# Patient Record
Sex: Male | Born: 1960 | Race: Black or African American | Hispanic: No | Marital: Married | Smoking: Former smoker
Health system: Southern US, Community
[De-identification: ages and names within clinical notes are randomized; demographics above are authoritative.]

## PROBLEM LIST (undated history)

## (undated) DIAGNOSIS — S14105A Unspecified injury at C5 level of cervical spinal cord, initial encounter: Secondary | ICD-10-CM

## (undated) DIAGNOSIS — E785 Hyperlipidemia, unspecified: Secondary | ICD-10-CM

## (undated) DIAGNOSIS — G8929 Other chronic pain: Secondary | ICD-10-CM

## (undated) DIAGNOSIS — J45909 Unspecified asthma, uncomplicated: Secondary | ICD-10-CM

## (undated) DIAGNOSIS — U071 COVID-19: Secondary | ICD-10-CM

## (undated) DIAGNOSIS — I10 Essential (primary) hypertension: Secondary | ICD-10-CM

## (undated) DIAGNOSIS — C801 Malignant (primary) neoplasm, unspecified: Secondary | ICD-10-CM

## (undated) DIAGNOSIS — H409 Unspecified glaucoma: Secondary | ICD-10-CM

## (undated) HISTORY — DX: Unspecified asthma, uncomplicated: J45.909

## (undated) HISTORY — DX: Other chronic pain: G89.29

## (undated) HISTORY — DX: COVID-19: U07.1

## (undated) HISTORY — DX: Unspecified injury at C5 level of cervical spinal cord, initial encounter: S14.105A

## (undated) HISTORY — DX: Malignant (primary) neoplasm, unspecified: C80.1

## (undated) HISTORY — PX: PROSTATECTOMY: SHX69

## (undated) HISTORY — PX: BACK SURGERY: SHX140

## (undated) HISTORY — DX: Essential (primary) hypertension: I10

## (undated) HISTORY — DX: Hyperlipidemia, unspecified: E78.5

## (undated) HISTORY — DX: Unspecified glaucoma: H40.9

## (undated) HISTORY — PX: LUMBAR LAMINECTOMY: SHX95

---

## 2013-08-02 LAB — HM COLONOSCOPY

## 2018-11-06 DIAGNOSIS — J300 Vasomotor rhinitis: Secondary | ICD-10-CM | POA: Diagnosis not present

## 2018-11-06 DIAGNOSIS — H2511 Age-related nuclear cataract, right eye: Secondary | ICD-10-CM | POA: Diagnosis not present

## 2018-11-06 DIAGNOSIS — E7849 Other hyperlipidemia: Secondary | ICD-10-CM | POA: Diagnosis not present

## 2018-11-06 DIAGNOSIS — C801 Malignant (primary) neoplasm, unspecified: Secondary | ICD-10-CM | POA: Diagnosis not present

## 2018-11-06 DIAGNOSIS — H25012 Cortical age-related cataract, left eye: Secondary | ICD-10-CM | POA: Diagnosis not present

## 2018-11-06 DIAGNOSIS — H2512 Age-related nuclear cataract, left eye: Secondary | ICD-10-CM | POA: Diagnosis not present

## 2018-11-06 DIAGNOSIS — R7302 Impaired glucose tolerance (oral): Secondary | ICD-10-CM | POA: Diagnosis not present

## 2018-11-06 DIAGNOSIS — D509 Iron deficiency anemia, unspecified: Secondary | ICD-10-CM | POA: Diagnosis not present

## 2018-11-06 DIAGNOSIS — H25011 Cortical age-related cataract, right eye: Secondary | ICD-10-CM | POA: Diagnosis not present

## 2018-11-06 DIAGNOSIS — H3581 Retinal edema: Secondary | ICD-10-CM | POA: Diagnosis not present

## 2018-11-06 DIAGNOSIS — E538 Deficiency of other specified B group vitamins: Secondary | ICD-10-CM | POA: Diagnosis not present

## 2018-11-06 DIAGNOSIS — B0052 Herpesviral keratitis: Secondary | ICD-10-CM | POA: Diagnosis not present

## 2018-11-06 DIAGNOSIS — H401132 Primary open-angle glaucoma, bilateral, moderate stage: Secondary | ICD-10-CM | POA: Diagnosis not present

## 2018-11-06 DIAGNOSIS — M25569 Pain in unspecified knee: Secondary | ICD-10-CM | POA: Diagnosis not present

## 2018-11-06 DIAGNOSIS — B0053 Herpesviral conjunctivitis: Secondary | ICD-10-CM | POA: Diagnosis not present

## 2018-11-06 DIAGNOSIS — E559 Vitamin D deficiency, unspecified: Secondary | ICD-10-CM | POA: Diagnosis not present

## 2018-11-06 DIAGNOSIS — H35039 Hypertensive retinopathy, unspecified eye: Secondary | ICD-10-CM | POA: Diagnosis not present

## 2018-11-06 DIAGNOSIS — R972 Elevated prostate specific antigen [PSA]: Secondary | ICD-10-CM | POA: Diagnosis not present

## 2018-11-06 DIAGNOSIS — H35719 Central serous chorioretinopathy, unspecified eye: Secondary | ICD-10-CM | POA: Diagnosis not present

## 2018-11-06 DIAGNOSIS — I10 Essential (primary) hypertension: Secondary | ICD-10-CM | POA: Diagnosis not present

## 2018-12-27 DIAGNOSIS — J3089 Other allergic rhinitis: Secondary | ICD-10-CM | POA: Diagnosis not present

## 2019-01-01 DIAGNOSIS — E559 Vitamin D deficiency, unspecified: Secondary | ICD-10-CM | POA: Diagnosis not present

## 2019-01-01 DIAGNOSIS — H409 Unspecified glaucoma: Secondary | ICD-10-CM | POA: Diagnosis not present

## 2019-01-01 DIAGNOSIS — J309 Allergic rhinitis, unspecified: Secondary | ICD-10-CM | POA: Diagnosis not present

## 2019-01-01 DIAGNOSIS — M25541 Pain in joints of right hand: Secondary | ICD-10-CM | POA: Diagnosis not present

## 2019-01-01 DIAGNOSIS — E6609 Other obesity due to excess calories: Secondary | ICD-10-CM | POA: Diagnosis not present

## 2019-01-01 DIAGNOSIS — G629 Polyneuropathy, unspecified: Secondary | ICD-10-CM | POA: Diagnosis not present

## 2019-01-01 DIAGNOSIS — I10 Essential (primary) hypertension: Secondary | ICD-10-CM | POA: Diagnosis not present

## 2019-01-01 DIAGNOSIS — E538 Deficiency of other specified B group vitamins: Secondary | ICD-10-CM | POA: Diagnosis not present

## 2019-01-01 DIAGNOSIS — H04123 Dry eye syndrome of bilateral lacrimal glands: Secondary | ICD-10-CM | POA: Diagnosis not present

## 2019-01-01 DIAGNOSIS — E785 Hyperlipidemia, unspecified: Secondary | ICD-10-CM | POA: Diagnosis not present

## 2019-01-09 ENCOUNTER — Ambulatory Visit: Payer: Self-pay | Admitting: Family Medicine

## 2019-01-15 ENCOUNTER — Telehealth: Payer: Self-pay

## 2019-01-15 NOTE — Telephone Encounter (Signed)
Copied from [place] [id number]. Topic: Appointment Scheduling - Scheduling Inquiry for Clinic >> Jan 15, 2019  2:56 PM Scherrie Gerlach wrote: Reason for CRM: pt returning rasheeda call to resc his appt

## 2019-01-23 ENCOUNTER — Ambulatory Visit: Payer: Self-pay | Admitting: Internal Medicine

## 2019-02-04 ENCOUNTER — Telehealth: Payer: Self-pay | Admitting: Internal Medicine

## 2019-02-04 NOTE — Telephone Encounter (Signed)
I am thinking this NP appt needs to be reschedule to virtual with NP or later visit with me after COVID 19 face to face visit in sumemr 2020 04/2019   Please call pt to reschedule  thanks  [place]

## 2019-02-05 NOTE — Telephone Encounter (Signed)
I left the patient a message to call the office to reschedule.

## 2019-02-05 NOTE — Telephone Encounter (Signed)
The patient has been reschedule to June.

## 2019-02-06 NOTE — Telephone Encounter (Signed)
Ok. Thank you.

## 2019-02-13 DIAGNOSIS — Z86010 Personal history of colonic polyps: Secondary | ICD-10-CM | POA: Diagnosis not present

## 2019-02-13 DIAGNOSIS — I10 Essential (primary) hypertension: Secondary | ICD-10-CM | POA: Diagnosis not present

## 2019-02-13 DIAGNOSIS — K7689 Other specified diseases of liver: Secondary | ICD-10-CM | POA: Diagnosis not present

## 2019-02-13 DIAGNOSIS — K5909 Other constipation: Secondary | ICD-10-CM | POA: Diagnosis not present

## 2019-02-13 DIAGNOSIS — E669 Obesity, unspecified: Secondary | ICD-10-CM | POA: Diagnosis not present

## 2019-02-13 DIAGNOSIS — K862 Cyst of pancreas: Secondary | ICD-10-CM | POA: Diagnosis not present

## 2019-02-13 DIAGNOSIS — Z800 Family history of malignant neoplasm of digestive organs: Secondary | ICD-10-CM | POA: Diagnosis not present

## 2019-02-25 ENCOUNTER — Ambulatory Visit: Payer: Self-pay | Admitting: Internal Medicine

## 2019-03-28 ENCOUNTER — Ambulatory Visit (INDEPENDENT_AMBULATORY_CARE_PROVIDER_SITE_OTHER): Payer: Medicare HMO | Admitting: Internal Medicine

## 2019-03-28 ENCOUNTER — Other Ambulatory Visit: Payer: Self-pay

## 2019-03-28 DIAGNOSIS — E538 Deficiency of other specified B group vitamins: Secondary | ICD-10-CM

## 2019-03-28 DIAGNOSIS — R739 Hyperglycemia, unspecified: Secondary | ICD-10-CM

## 2019-03-28 DIAGNOSIS — Z1159 Encounter for screening for other viral diseases: Secondary | ICD-10-CM

## 2019-03-28 DIAGNOSIS — G8929 Other chronic pain: Secondary | ICD-10-CM | POA: Diagnosis not present

## 2019-03-28 DIAGNOSIS — S14105S Unspecified injury at C5 level of cervical spinal cord, sequela: Secondary | ICD-10-CM | POA: Diagnosis not present

## 2019-03-28 DIAGNOSIS — H409 Unspecified glaucoma: Secondary | ICD-10-CM | POA: Diagnosis not present

## 2019-03-28 DIAGNOSIS — E559 Vitamin D deficiency, unspecified: Secondary | ICD-10-CM

## 2019-03-28 DIAGNOSIS — E785 Hyperlipidemia, unspecified: Secondary | ICD-10-CM | POA: Diagnosis not present

## 2019-03-28 DIAGNOSIS — I10 Essential (primary) hypertension: Secondary | ICD-10-CM

## 2019-03-28 DIAGNOSIS — C801 Malignant (primary) neoplasm, unspecified: Secondary | ICD-10-CM | POA: Diagnosis not present

## 2019-03-28 DIAGNOSIS — Z8546 Personal history of malignant neoplasm of prostate: Secondary | ICD-10-CM | POA: Diagnosis not present

## 2019-03-28 DIAGNOSIS — J4520 Mild intermittent asthma, uncomplicated: Secondary | ICD-10-CM

## 2019-03-28 DIAGNOSIS — Z13818 Encounter for screening for other digestive system disorders: Secondary | ICD-10-CM

## 2019-03-28 DIAGNOSIS — Z1329 Encounter for screening for other suspected endocrine disorder: Secondary | ICD-10-CM | POA: Diagnosis not present

## 2019-03-28 MED ORDER — ALBUTEROL SULFATE HFA 108 (90 BASE) MCG/ACT IN AERS: 1.0000 | INHALATION_SPRAY | Freq: Four times a day (QID) | RESPIRATORY_TRACT | Status: DC | PRN

## 2019-03-28 MED ORDER — LOSARTAN POTASSIUM 50 MG PO TABS: 50.0000 mg | ORAL_TABLET | Freq: Every day | ORAL | 3 refills | Status: DC

## 2019-03-28 MED ORDER — B-12 500 MCG PO TABS: 500.0000 ug | ORAL_TABLET | Freq: Every day | ORAL | 3 refills | Status: DC

## 2019-03-28 MED ORDER — ATORVASTATIN CALCIUM 20 MG PO TABS: 20.0000 mg | ORAL_TABLET | Freq: Every day | ORAL | 3 refills | Status: DC

## 2019-03-28 MED ORDER — GABAPENTIN 800 MG PO TABS: 800.0000 mg | ORAL_TABLET | Freq: Three times a day (TID) | ORAL | Status: DC

## 2019-03-28 NOTE — Progress Notes (Signed)
Virtual Visit via Video Note  I connected with Matthew Turner  on 03/28/19 at  3:50 PM EDT by a video enabled telemedicine application and verified that I am speaking with the correct person using two identifiers. Location patient: home Location provider:work or home office Persons participating in the virtual visit: patient, provider, pts wife   I discussed the limitations of evaluation and management by telemedicine and the availability of in person appointments. The patient expressed understanding and agreed to proceed.   HPI: Essential hypertension/HLD -  losartan (COZAAR) 50 MG tablet he is not checking his BP at home  Needs refill of losartan. On lipitor 20 mg qd  chronic pain s/p SCI in 2008 he reports he was unable to walk initially paralyzed from the waist down but recovered with PT but has chronic pain.  He f/u pain clinic in [place] and does not want to change gabapentin (NEURONTIN) 800 MG tablet tid chronic narcotics he f/u with pain clinic in [place] monthly   Mild intermittent extrinsic asthma without complication triggers include dust and he needs refill of inhalern: albuterol (PROAIR HFA) 108 (90 Base) MCG/ACT inhaler  Glaucoma, unspecified glaucoma type, unspecified laterality-given list of eye MD to choose in [place]  History of prostate cancer s/p prostatectomy and radiation    Hyperglycemia - Plan: Hemoglobin A1c  Vitamin D deficiency - on high dose D3 50K weekly     ROS: See pertinent positives and negatives per HPI. General: weight stable  HEENT: no sore throat  CV: no chest pain  Lungs: no sob  GI: no ab pain  Neuro: no h/a  MSK: chronic pain s/p SCI Psych: no anxiety/depression   Past Medical History:  Diagnosis Date  . Asthma    dust  . Cancer ([place])    prostate; early 54s   . Chronic pain    f/u pain clinic in [place]; arms, neck, back-pain clinic in [place]  . Glaucoma   . HLD (hyperlipidemia)   . Hypertension   . Spinal cord injury, C5-C7 ([place])    C6.C7  in 2008 injury at work he was paralyzed from waist down but walking again;workers comp    Past Surgical History:  Procedure Laterality Date  . LUMBAR LAMINECTOMY    . PROSTATECTOMY     had radiation no injections h/o prostate pump     Family History  Problem Relation Age of Onset  . Heart failure Mother   . Cancer Father        prostate  . Cancer Brother        prostate  . Pancreatic cancer Paternal Grandmother   . Prostate cancer Other   . Prostate cancer Brother        prostate    SOCIAL HX: married, retired    Current Outpatient Medications:  .  albuterol (PROAIR HFA) 108 (90 Base) MCG/ACT inhaler, Inhale 1-2 puffs into the lungs every 6 (six) hours as needed for wheezing or shortness of breath., Disp: , Rfl:  .  atorvastatin (LIPITOR) 20 MG tablet, Take 1 tablet (20 mg total) by mouth daily., Disp: 90 tablet, Rfl: 3 .  bimatoprost (LUMIGAN) 0.01 % SOLN, Apply to eye. 1 drop affected eye, Disp: , Rfl:  .  FOLIC ACID PO, Take 1 mg by mouth daily. , Disp: , Rfl:  .  gabapentin (NEURONTIN) 800 MG tablet, Take 1 tablet (800 mg total) by mouth 3 (three) times daily., Disp: , Rfl:  .  oxyCODONE-acetaminophen (PERCOCET) 10-325 MG tablet, Take  1 tablet by mouth every 8 (eight) hours as needed for pain. Dr. Vira [facility] [place], Disp: , Rfl:  .  Vitamin D, Ergocalciferol, (DRISDOL) 1.25 MG (50000 UT) CAPS capsule, , Disp: , Rfl:  .  Cyanocobalamin (B-12) 500 MCG TABS, Take 500 mcg by mouth daily., Disp: 90 tablet, Rfl: 3 .  DYMISTA 137-50 MCG/ACT SUSP, , Disp: , Rfl:  .  losartan (COZAAR) 50 MG tablet, Take 1 tablet (50 mg total) by mouth daily., Disp: 90 tablet, Rfl: 3 .  pantoprazole (PROTONIX) 40 MG tablet, pantoprazole 40 mg tablet,delayed release, Disp: , Rfl:   EXAM:  VITALS per patient if applicable:  GENERAL: alert, oriented, appears well and in no acute distress  HEENT: atraumatic, conjunttiva clear, no obvious abnormalities on inspection of  external nose and ears  NECK: normal movements of the head and neck  LUNGS: on inspection no signs of respiratory distress, breathing rate appears normal, no obvious gross SOB, gasping or wheezing  CV: no obvious cyanosis  MS: moves all visible extremities without noticeable abnormality  PSYCH/NEURO: pleasant and cooperative, no obvious depression or anxiety, speech and thought processing grossly intact  ASSESSMENT AND PLAN:  Discussed the following assessment and plan:  Essential hypertension - Plan: losartan (COZAAR) 50 MG tablet, sch fasting labs  Also do RN visit BP check with labs  B12 deficiency - Plan: Cyanocobalamin (B-12) 500 MCG TABS, B12 500 mcg qd  Other chronic pain s/p SCI in 2008 - Plan: gabapentin (NEURONTIN) 800 MG tablet, chronic narcotics he f/u with pain clinic in [place] monthly  -cont meds   Mild intermittent extrinsic asthma without complication - Plan: albuterol (PROAIR HFA) 108 (90 Base) MCG/ACT inhaler  Hyperlipidemia, unspecified hyperlipidemia type - Plan: atorvastatin (LIPITOR) 20 MG tablet  Glaucoma, unspecified glaucoma type, unspecified laterality-given list of eye MD to choose in [place]  History of prostate cancer - Plan: Urinalysis, Routine w reflex microscopic, PSA, Medicare ( [place] only)  Hyperglycemia - Plan: Hemoglobin A1c  Vitamin D deficiency - Plan: Vitamin D (25 hydroxy) cont high dose D3 50K weekly for now   HM  Get vaccine records [place] -[facility] in [place] Colonoscopy due and rec for future pt will do in [place] comfortable with this MD  sch fasting labs  Check PSA h/o prostate cancer with h/o removal and radiation no injections Former smoker quit in 40s  Occasional etoh   Given names of eye MD and dentists in the area  Former PCP Dr. Genia Plants [place] in [place] [address] in [place]     I discussed the assessment and treatment plan with the patient. The patient was  provided an opportunity to ask questions and all were answered. The patient agreed with the plan and demonstrated an understanding of the instructions.   The patient was advised to call back or seek an in-person evaluation if the symptoms worsen or if the condition fails to improve as anticipated.  Time spent 25 minutes  Delorise Luckey, MD

## 2019-04-01 ENCOUNTER — Encounter: Payer: Self-pay | Admitting: Internal Medicine

## 2019-04-01 DIAGNOSIS — I10 Essential (primary) hypertension: Secondary | ICD-10-CM | POA: Insufficient documentation

## 2019-04-01 DIAGNOSIS — C801 Malignant (primary) neoplasm, unspecified: Secondary | ICD-10-CM | POA: Insufficient documentation

## 2019-04-01 DIAGNOSIS — S14105A Unspecified injury at C5 level of cervical spinal cord, initial encounter: Secondary | ICD-10-CM | POA: Insufficient documentation

## 2019-04-01 DIAGNOSIS — G8929 Other chronic pain: Secondary | ICD-10-CM | POA: Insufficient documentation

## 2019-04-01 DIAGNOSIS — J45909 Unspecified asthma, uncomplicated: Secondary | ICD-10-CM | POA: Insufficient documentation

## 2019-04-01 DIAGNOSIS — E785 Hyperlipidemia, unspecified: Secondary | ICD-10-CM | POA: Insufficient documentation

## 2019-04-01 DIAGNOSIS — Z8546 Personal history of malignant neoplasm of prostate: Secondary | ICD-10-CM | POA: Insufficient documentation

## 2019-04-01 DIAGNOSIS — H409 Unspecified glaucoma: Secondary | ICD-10-CM | POA: Insufficient documentation

## 2019-04-01 NOTE — Patient Instructions (Addendum)
Eye doctors 1. [facility] in [place]  2. Dr. Gertie Baron [place]  3. [facility] [place]   Dentists  1. [facility] [place] 2. [place] [facility]  3. Dr. Johnnette Litter [place]  3. Dr. Lyanne Co [facility] in [place]    Hypertension Hypertension, commonly called high blood pressure, is when the force of blood pumping through the arteries is too strong. The arteries are the blood vessels that carry blood from the heart throughout the body. Hypertension forces the heart to work harder to pump blood and may cause arteries to become narrow or stiff. Having untreated or uncontrolled hypertension can cause heart attacks, strokes, kidney disease, and other problems. A blood pressure reading consists of a higher number over a lower number. Ideally, your blood pressure should be below 120/80. The first ("top") number is called the systolic pressure. It is a measure of the pressure in your arteries as your heart beats. The second ("bottom") number is called the diastolic pressure. It is a measure of the pressure in your arteries as the heart relaxes. What are the causes? The cause of this condition is not known. What increases the risk? Some risk factors for high blood pressure are under your control. Others are not. Factors you can change  Smoking.  Having type 2 diabetes mellitus, high cholesterol, or both.  Not getting enough exercise or physical activity.  Being overweight.  Having too much fat, sugar, calories, or salt (sodium) in your diet.  Drinking too much alcohol. Factors that are difficult or impossible to change  Having chronic kidney disease.  Having a family history of high blood pressure.  Age. Risk increases with age.  Race. You may be at higher risk if you are African-American.  Gender. Men are at higher risk than women before age 50. After age 49, women are at higher risk than men.  Having obstructive  sleep apnea.  Stress. What are the signs or symptoms? Extremely high blood pressure (hypertensive crisis) may cause:  Headache.  Anxiety.  Shortness of breath.  Nosebleed.  Nausea and vomiting.  Severe chest pain.  Jerky movements you cannot control (seizures). How is this diagnosed? This condition is diagnosed by measuring your blood pressure while you are seated, with your arm resting on a surface. The cuff of the blood pressure monitor will be placed directly against the skin of your upper arm at the level of your heart. It should be measured at least twice using the same arm. Certain conditions can cause a difference in blood pressure between your right and left arms. Certain factors can cause blood pressure readings to be lower or higher than normal (elevated) for a short period of time:  When your blood pressure is higher when you are in a health care provider's office than when you are at home, this is called white coat hypertension. Most people with this condition do not need medicines.  When your blood pressure is higher at home than when you are in a health care provider's office, this is called masked hypertension. Most people with this condition may need medicines to control blood pressure. If you have a high blood pressure reading during one visit or you have normal blood pressure with other risk factors:  You may be asked to return on a different day to have your blood pressure checked again.  You may be asked to monitor your blood pressure at home for 1 week or  longer. If you are diagnosed with hypertension, you may have other blood or imaging tests to help your health care provider understand your overall risk for other conditions. How is this treated? This condition is treated by making healthy lifestyle changes, such as eating healthy foods, exercising more, and reducing your alcohol intake. Your health care provider may prescribe medicine if lifestyle changes are  not enough to get your blood pressure under control, and if:  Your systolic blood pressure is above 130.  Your diastolic blood pressure is above 80. Your personal target blood pressure may vary depending on your medical conditions, your age, and other factors. Follow these instructions at home: Eating and drinking   Eat a diet that is high in fiber and potassium, and low in sodium, added sugar, and fat. An example eating plan is called the DASH (Dietary Approaches to Stop Hypertension) diet. To eat this way: ? Eat plenty of fresh fruits and vegetables. Try to fill half of your plate at each meal with fruits and vegetables. ? Eat whole grains, such as whole wheat pasta, brown rice, or whole grain bread. Fill about one quarter of your plate with whole grains. ? Eat or drink low-fat dairy products, such as skim milk or low-fat yogurt. ? Avoid fatty cuts of meat, processed or cured meats, and poultry with skin. Fill about one quarter of your plate with lean proteins, such as fish, chicken without skin, beans, eggs, and tofu. ? Avoid premade and processed foods. These tend to be higher in sodium, added sugar, and fat.  Reduce your daily sodium intake. Most people with hypertension should eat less than 1,500 mg of sodium a day.  Limit alcohol intake to no more than 1 drink a day for nonpregnant women and 2 drinks a day for men. One drink equals 12 oz of beer, 5 oz of wine, or 1 oz of hard liquor. Lifestyle   Work with your health care provider to maintain a healthy body weight or to lose weight. Ask what an ideal weight is for you.  Get at least 30 minutes of exercise that causes your heart to beat faster (aerobic exercise) most days of the week. Activities may include walking, swimming, or biking.  Include exercise to strengthen your muscles (resistance exercise), such as pilates or lifting weights, as part of your weekly exercise routine. Try to do these types of exercises for 30 minutes at  least 3 days a week.  Do not use any products that contain nicotine or tobacco, such as cigarettes and e-cigarettes. If you need help quitting, ask your health care provider.  Monitor your blood pressure at home as told by your health care provider.  Keep all follow-up visits as told by your health care provider. This is important. Medicines  Take over-the-counter and prescription medicines only as told by your health care provider. Follow directions carefully. Blood pressure medicines must be taken as prescribed.  Do not skip doses of blood pressure medicine. Doing this puts you at risk for problems and can make the medicine less effective.  Ask your health care provider about side effects or reactions to medicines that you should watch for. Contact a health care provider if:  You think you are having a reaction to a medicine you are taking.  You have headaches that keep coming back (recurring).  You feel dizzy.  You have swelling in your ankles.  You have trouble with your vision. Get help right away if:  You develop a  severe headache or confusion.  You have unusual weakness or numbness.  You feel faint.  You have severe pain in your chest or abdomen.  You vomit repeatedly.  You have trouble breathing. Summary  Hypertension is when the force of blood pumping through your arteries is too strong. If this condition is not controlled, it may put you at risk for serious complications.  Your personal target blood pressure may vary depending on your medical conditions, your age, and other factors. For most people, a normal blood pressure is less than 120/80.  Hypertension is treated with lifestyle changes, medicines, or a combination of both. Lifestyle changes include weight loss, eating a healthy, low-sodium diet, exercising more, and limiting alcohol. This information is not intended to replace advice given to you by your health care provider. Make sure you discuss any  questions you have with your health care provider. Document Released: 10/09/2005 Document Revised: 09/06/2016 Document Reviewed: 09/06/2016 Elsevier Interactive Patient Education  2019 [place] DASH stands for "Dietary Approaches to Stop Hypertension." The DASH eating plan is a healthy eating plan that has been shown to reduce high blood pressure (hypertension). It may also reduce your risk for type 2 diabetes, heart disease, and stroke. The DASH eating plan may also help with weight loss. What are tips for following this plan?  General guidelines  Avoid eating more than 2,300 mg (milligrams) of salt (sodium) a day. If you have hypertension, you may need to reduce your sodium intake to 1,500 mg a day.  Limit alcohol intake to no more than 1 drink a day for nonpregnant women and 2 drinks a day for men. One drink equals 12 oz of beer, 5 oz of wine, or 1 oz of hard liquor.  Work with your health care provider to maintain a healthy body weight or to lose weight. Ask what an ideal weight is for you.  Get at least 30 minutes of exercise that causes your heart to beat faster (aerobic exercise) most days of the week. Activities may include walking, swimming, or biking.  Work with your health care provider or diet and nutrition specialist (dietitian) to adjust your eating plan to your individual calorie needs. Reading food labels   Check food labels for the amount of sodium per serving. Choose foods with less than 5 percent of the Daily Value of sodium. Generally, foods with less than 300 mg of sodium per serving fit into this eating plan.  To find whole grains, look for the word "whole" as the first word in the ingredient list. Shopping  Buy products labeled as "low-sodium" or "no salt added."  Buy fresh foods. Avoid canned foods and premade or frozen meals. Cooking  Avoid adding salt when cooking. Use salt-free seasonings or herbs instead of table salt or sea salt.  Check with your health care provider or pharmacist before using salt substitutes.  Do not fry foods. Cook foods using healthy methods such as baking, boiling, grilling, and broiling instead.  Cook with heart-healthy oils, such as olive, canola, soybean, or sunflower oil. Meal planning  Eat a balanced diet that includes: ? 5 or more servings of fruits and vegetables each day. At each meal, try to fill half of your plate with fruits and vegetables. ? Up to 6-8 servings of whole grains each day. ? Less than 6 oz of lean meat, poultry, or fish each day. A 3-oz serving of meat is about the same size as a deck of cards.  One egg equals 1 oz. ? 2 servings of low-fat dairy each day. ? A serving of nuts, seeds, or beans 5 times each week. ? Heart-healthy fats. Healthy fats called Omega-3 fatty acids are found in foods such as flaxseeds and coldwater fish, like sardines, salmon, and mackerel.  Limit how much you eat of the following: ? Canned or prepackaged foods. ? Food that is high in trans fat, such as fried foods. ? Food that is high in saturated fat, such as fatty meat. ? Sweets, desserts, sugary drinks, and other foods with added sugar. ? Full-fat dairy products.  Do not salt foods before eating.  Try to eat at least 2 vegetarian meals each week.  Eat more home-cooked food and less restaurant, buffet, and fast food.  When eating at a restaurant, ask that your food be prepared with less salt or no salt, if possible. What foods are recommended? The items listed may not be a complete list. Talk with your dietitian about what dietary choices are best for you. Grains Whole-grain or whole-wheat bread. Whole-grain or whole-wheat pasta. Brown rice. [place]. Bulgur. Whole-grain and low-sodium cereals. Pita bread. Low-fat, low-sodium crackers. Whole-wheat flour tortillas. Vegetables Fresh or frozen vegetables (raw, steamed, roasted, or grilled). Low-sodium or reduced-sodium tomato and  vegetable juice. Low-sodium or reduced-sodium tomato sauce and tomato paste. Low-sodium or reduced-sodium canned vegetables. Fruits All fresh, dried, or frozen fruit. Canned fruit in natural juice (without added sugar). Meat and other protein foods Skinless chicken or [place]. Ground chicken or [place]. Pork with fat trimmed off. Fish and seafood. Egg whites. Dried beans, peas, or lentils. Unsalted nuts, nut butters, and seeds. Unsalted canned beans. Lean cuts of beef with fat trimmed off. Low-sodium, lean deli meat. Dairy Low-fat (1%) or fat-free (skim) milk. Fat-free, low-fat, or reduced-fat cheeses. Nonfat, low-sodium ricotta or cottage cheese. Low-fat or nonfat yogurt. Low-fat, low-sodium cheese. Fats and oils Soft margarine without trans fats. Vegetable oil. Low-fat, reduced-fat, or light mayonnaise and salad dressings (reduced-sodium). Canola, safflower, olive, soybean, and sunflower oils. Avocado. Seasoning and other foods Herbs. Spices. Seasoning mixes without salt. Unsalted popcorn and pretzels. Fat-free sweets. What foods are not recommended? The items listed may not be a complete list. Talk with your dietitian about what dietary choices are best for you. Grains Baked goods made with fat, such as croissants, muffins, or some breads. Dry pasta or rice meal packs. Vegetables Creamed or fried vegetables. Vegetables in a cheese sauce. Regular canned vegetables (not low-sodium or reduced-sodium). Regular canned tomato sauce and paste (not low-sodium or reduced-sodium). Regular tomato and vegetable juice (not low-sodium or reduced-sodium). Angie Fava. Olives. Fruits Canned fruit in a light or heavy syrup. Fried fruit. Fruit in cream or butter sauce. Meat and other protein foods Fatty cuts of meat. Ribs. Fried meat. Berniece Salines. Sausage. Bologna and other processed lunch meats. Salami. Fatback. Hotdogs. Bratwurst. Salted nuts and seeds. Canned beans with added salt. Canned or smoked fish. Whole eggs or  egg yolks. Chicken or [place] with skin. Dairy Whole or 2% milk, cream, and half-and-half. Whole or full-fat cream cheese. Whole-fat or sweetened yogurt. Full-fat cheese. Nondairy creamers. Whipped toppings. Processed cheese and cheese spreads. Fats and oils Butter. Stick margarine. Lard. Shortening. Ghee. Bacon fat. Tropical oils, such as coconut, palm kernel, or palm oil. Seasoning and other foods Salted popcorn and pretzels. Onion salt, garlic salt, seasoned salt, table salt, and sea salt. Worcestershire sauce. Tartar sauce. Barbecue sauce. Teriyaki sauce. Soy sauce, including reduced-sodium. Steak sauce. Canned and packaged gravies.  Fish sauce. Oyster sauce. Cocktail sauce. Horseradish that you find on the shelf. Ketchup. Mustard. Meat flavorings and tenderizers. Bouillon cubes. Hot sauce and Tabasco sauce. Premade or packaged marinades. Premade or packaged taco seasonings. Relishes. Regular salad dressings. Where to find more information:  National Heart, Lung, and [place]: https://wilson-eaton.com/  American Heart Association: www.heart.org Summary  The DASH eating plan is a healthy eating plan that has been shown to reduce high blood pressure (hypertension). It may also reduce your risk for type 2 diabetes, heart disease, and stroke.  With the DASH eating plan, you should limit salt (sodium) intake to 2,300 mg a day. If you have hypertension, you may need to reduce your sodium intake to 1,500 mg a day.  When on the DASH eating plan, aim to eat more fresh fruits and vegetables, whole grains, lean proteins, low-fat dairy, and heart-healthy fats.  Work with your health care provider or diet and nutrition specialist (dietitian) to adjust your eating plan to your individual calorie needs. This information is not intended to replace advice given to you by your health care provider. Make sure you discuss any questions you have with your health care provider. Document Released: 09/28/2011  Document Revised: 10/02/2016 Document Reviewed: 10/02/2016 Elsevier Interactive Patient Education  2019 [employer].

## 2019-06-26 DIAGNOSIS — R69 Illness, unspecified: Secondary | ICD-10-CM | POA: Diagnosis not present

## 2019-07-02 ENCOUNTER — Other Ambulatory Visit (INDEPENDENT_AMBULATORY_CARE_PROVIDER_SITE_OTHER): Payer: Medicare HMO

## 2019-07-02 ENCOUNTER — Other Ambulatory Visit: Payer: Self-pay

## 2019-07-02 DIAGNOSIS — Z8546 Personal history of malignant neoplasm of prostate: Secondary | ICD-10-CM

## 2019-07-02 DIAGNOSIS — E538 Deficiency of other specified B group vitamins: Secondary | ICD-10-CM | POA: Diagnosis not present

## 2019-07-02 DIAGNOSIS — C801 Malignant (primary) neoplasm, unspecified: Secondary | ICD-10-CM

## 2019-07-02 DIAGNOSIS — Z1329 Encounter for screening for other suspected endocrine disorder: Secondary | ICD-10-CM | POA: Diagnosis not present

## 2019-07-02 DIAGNOSIS — R739 Hyperglycemia, unspecified: Secondary | ICD-10-CM

## 2019-07-02 DIAGNOSIS — Z1159 Encounter for screening for other viral diseases: Secondary | ICD-10-CM | POA: Diagnosis not present

## 2019-07-02 DIAGNOSIS — E559 Vitamin D deficiency, unspecified: Secondary | ICD-10-CM

## 2019-07-02 DIAGNOSIS — Z13818 Encounter for screening for other digestive system disorders: Secondary | ICD-10-CM

## 2019-07-02 DIAGNOSIS — I10 Essential (primary) hypertension: Secondary | ICD-10-CM | POA: Diagnosis not present

## 2019-07-02 LAB — PSA, MEDICARE: PSA: 0 ng/ml — ABNORMAL LOW (ref 0.10–4.00)

## 2019-07-02 LAB — COMPREHENSIVE METABOLIC PANEL
ALT: 17 U/L (ref 0–53)
AST: 14 U/L (ref 0–37)
Albumin: 4.2 g/dL (ref 3.5–5.2)
Alkaline Phosphatase: 78 U/L (ref 39–117)
BUN: 12 mg/dL (ref 6–23)
CO2: 29 mEq/L (ref 19–32)
Calcium: 9 mg/dL (ref 8.4–10.5)
Chloride: 106 mEq/L (ref 96–112)
Creatinine, Ser: 1.05 mg/dL (ref 0.40–1.50)
GFR: 72.01 mL/min (ref >60.00)
Glucose, Bld: 97 mg/dL (ref 70–99)
Potassium: 4.4 mEq/L (ref 3.5–5.1)
Sodium: 142 mEq/L (ref 135–145)
Total Bilirubin: 1 mg/dL (ref 0.2–1.2)
Total Protein: 6.6 g/dL (ref 6.0–8.3)

## 2019-07-02 LAB — LIPID PANEL
Cholesterol: 142 mg/dL (ref 0–200)
HDL: 50.8 mg/dL (ref >39.00)
LDL Cholesterol: 82 mg/dL (ref 0–99)
NonHDL: 91.52
Total CHOL/HDL Ratio: 3
Triglycerides: 50 mg/dL (ref 0.0–149.0)
VLDL: 10 mg/dL (ref 0.0–40.0)

## 2019-07-02 LAB — CBC WITH DIFFERENTIAL/PLATELET
Basophils Absolute: 0 10*3/uL (ref 0.0–0.1)
Basophils Relative: 0.8 % (ref 0.0–3.0)
Eosinophils Absolute: 0.1 10*3/uL (ref 0.0–0.7)
Eosinophils Relative: 3.9 % (ref 0.0–5.0)
HCT: 39.8 % (ref 39.0–52.0)
Hemoglobin: 13.5 g/dL (ref 13.0–17.0)
Lymphocytes Relative: 35.1 % (ref 12.0–46.0)
Lymphs Abs: 1.3 10*3/uL (ref 0.7–4.0)
MCHC: 33.9 g/dL (ref 30.0–36.0)
MCV: 90.9 fl (ref 78.0–100.0)
Monocytes Absolute: 0.3 10*3/uL (ref 0.1–1.0)
Monocytes Relative: 7.4 % (ref 3.0–12.0)
Neutro Abs: 2 10*3/uL (ref 1.4–7.7)
Neutrophils Relative %: 52.8 % (ref 43.0–77.0)
Platelets: 218 10*3/uL (ref 150.0–400.0)
RBC: 4.38 Mil/uL (ref 4.22–5.81)
RDW: 13.5 % (ref 11.5–15.5)
WBC: 3.8 10*3/uL — ABNORMAL LOW (ref 4.0–10.5)

## 2019-07-02 LAB — HEMOGLOBIN A1C: Hgb A1c MFr Bld: 5.5 % (ref 4.6–6.5)

## 2019-07-02 LAB — TSH: TSH: 2.65 u[IU]/mL (ref 0.35–4.50)

## 2019-07-02 LAB — VITAMIN B12: Vitamin B-12: 239 pg/mL (ref 211–911)

## 2019-07-02 LAB — VITAMIN D 25 HYDROXY (VIT D DEFICIENCY, FRACTURES): VITD: 33.31 ng/mL (ref 30.00–100.00)

## 2019-07-03 LAB — URINALYSIS, ROUTINE W REFLEX MICROSCOPIC
Bacteria, UA: NONE SEEN /HPF
Bilirubin Urine: NEGATIVE
Glucose, UA: NEGATIVE
Hgb urine dipstick: NEGATIVE
Hyaline Cast: NONE SEEN /LPF
Ketones, ur: NEGATIVE
Leukocytes,Ua: NEGATIVE
Nitrite: NEGATIVE
Specific Gravity, Urine: 1.027 (ref 1.001–1.03)
Squamous Epithelial / HPF: NONE SEEN /HPF (ref <=5)
WBC, UA: NONE SEEN /HPF (ref 0–5)
pH: <=5 (ref 5.0–8.0)

## 2019-07-03 LAB — HEPATITIS C ANTIBODY
Hepatitis C Ab: NONREACTIVE
SIGNAL TO CUT-OFF: 0.01 (ref <1.00)

## 2019-07-03 LAB — HIV ANTIBODY (ROUTINE TESTING W REFLEX): HIV 1&2 Ab, 4th Generation: NONREACTIVE

## 2019-07-09 ENCOUNTER — Encounter: Payer: Self-pay | Admitting: Internal Medicine

## 2019-07-09 ENCOUNTER — Ambulatory Visit (INDEPENDENT_AMBULATORY_CARE_PROVIDER_SITE_OTHER): Payer: Medicare HMO | Admitting: Internal Medicine

## 2019-07-09 VITALS — BP 130/78 | Ht 70.0 in

## 2019-07-09 DIAGNOSIS — I10 Essential (primary) hypertension: Secondary | ICD-10-CM | POA: Diagnosis not present

## 2019-07-09 DIAGNOSIS — S14105S Unspecified injury at C5 level of cervical spinal cord, sequela: Secondary | ICD-10-CM

## 2019-07-09 DIAGNOSIS — D72819 Decreased white blood cell count, unspecified: Secondary | ICD-10-CM

## 2019-07-09 DIAGNOSIS — H409 Unspecified glaucoma: Secondary | ICD-10-CM

## 2019-07-09 DIAGNOSIS — E785 Hyperlipidemia, unspecified: Secondary | ICD-10-CM | POA: Diagnosis not present

## 2019-07-09 DIAGNOSIS — G8929 Other chronic pain: Secondary | ICD-10-CM

## 2019-07-09 DIAGNOSIS — Z8546 Personal history of malignant neoplasm of prostate: Secondary | ICD-10-CM | POA: Diagnosis not present

## 2019-07-09 MED ORDER — LUMIGAN 0.01 % OP SOLN: 1.0000 [drp] | Freq: Every day | OPHTHALMIC | 5 refills | Status: DC

## 2019-07-09 NOTE — Progress Notes (Addendum)
Telephone Note  I connected with Matthew Turner   on 07/09/19 at  9:30 AM EDT telephone and verified that I am speaking with the correct person using two identifiers.  Location patient: car Location provider:work or home office Persons participating in the virtual visit: patient, provider  I discussed the limitations of evaluation and management by telemedicine and the availability of in person appointments. The patient expressed understanding and agreed to proceed.   HPI: 1. HTN 130/78 on losartan 50 mg qd  2. Reviewed labs wbc 3.8 will repeat in future 3. Urine cloudy he is drinking 8-9 bottles water qd  4. Chronic back pain getting injection 07/14/19 in [place] pain clinic pain meds   ROS: See pertinent positives and negatives per HPI.  Past Medical History:  Diagnosis Date  . Asthma    dust  . Cancer ([place])    prostate; early 90s   . Chronic pain    f/u pain clinic in [place]; arms, neck, back-pain clinic in [place]  . Glaucoma   . HLD (hyperlipidemia)   . Hypertension   . Spinal cord injury, C5-C7 ([place])    C6.C7 in 2008 injury at work he was paralyzed from waist down but walking again;workers comp    Past Surgical History:  Procedure Laterality Date  . LUMBAR LAMINECTOMY    . PROSTATECTOMY     had radiation no injections h/o prostate pump     Family History  Problem Relation Age of Onset  . Heart failure Mother   . Cancer Father        prostate  . Cancer Brother        prostate  . Pancreatic cancer Paternal Grandmother   . Prostate cancer Other   . Prostate cancer Brother        prostate    SOCIAL HX:  Former smoker quit 38  Occasional etoh  Married with 2 sons and 1 daughter  From [place] Retired     Current Outpatient Medications:  .  albuterol (PROAIR HFA) 108 (90 Base) MCG/ACT inhaler, Inhale 1-2 puffs into the lungs every 6 (six) hours as needed for wheezing or shortness of breath., Disp: , Rfl:  .  atorvastatin (LIPITOR) 20 MG tablet, Take 1 tablet (20 mg total)  by mouth daily., Disp: 90 tablet, Rfl: 3 .  bimatoprost (LUMIGAN) 0.01 % SOLN, Place 1 drop into both eyes at bedtime. 1 drop affected eye, Disp: 7.5 mL, Rfl: 5 .  Cyanocobalamin (B-12) 500 MCG TABS, Take 500 mcg by mouth daily., Disp: 90 tablet, Rfl: 3 .  DYMISTA 137-50 MCG/ACT SUSP, , Disp: , Rfl:  .  FOLIC ACID PO, Take 1 mg by mouth daily. , Disp: , Rfl:  .  gabapentin (NEURONTIN) 800 MG tablet, Take 1 tablet (800 mg total) by mouth 3 (three) times daily., Disp: , Rfl:  .  losartan (COZAAR) 50 MG tablet, Take 1 tablet (50 mg total) by mouth daily., Disp: 90 tablet, Rfl: 3 .  oxyCODONE-acetaminophen (PERCOCET) 10-325 MG tablet, Take 1 tablet by mouth every 8 (eight) hours as needed for pain. Dr. Vira [facility] [place], Disp: , Rfl:  .  pantoprazole (PROTONIX) 40 MG tablet, pantoprazole 40 mg tablet,delayed release, Disp: , Rfl:  .  Vitamin D, Ergocalciferol, (DRISDOL) 1.25 MG (50000 UT) CAPS capsule, , Disp: , Rfl:   EXAM:  VITALS per patient if applicable:  GENERAL: alert, oriented, appears well and in no acute distress  PSYCH/NEURO: pleasant and cooperative, no  obvious depression or anxiety, speech and thought processing grossly intact  ASSESSMENT AND PLAN:  Discussed the following assessment and plan:  Essential hypertension -cont meds monitor BP  Glaucoma of both eyes, unspecified glaucoma type - Plan: bimatoprost (LUMIGAN) 0.01 % SOLN  Hyperlipidemia, unspecified hyperlipidemia type -cont meds   Other chronic pain C5-C7 level spinal cord injury, sequela ([place]) -f/u pain clinic in [place]   History of prostate cancer undetectable   Leukopenia  -CBC repeat in future  -consider h/o in future if does not resolve  HM  Get vaccine records [place] -[facility] vaccines [facility] in [place] Colonoscopy due and rec for future pt will do in [place] comfortable with this MD   Undetectable PSA 07/02/2019 h/o prostate cancer with h/o removal and radiation  no injections Former smoker quit in 9s  Occasional etoh   Given names of eye MD and dentists in the area  Former PCP Dr. Genia Plants [place] in [place] in [place] received   EGD 09/25/10 esophagitis duodenitis mildly enlarged major papilla path neg h pylori chronic peptic duodentitis c/w adenomatous change; GERD   EGD path 06/05/12 gastric antrum bx negative duodenum negative no H pylori   EGD 01/23/15 mild erythema in stomach   EGD 01/24/17 -see report scanned in  path 01/24/17 gastric antrum bx and duodenum bx negative no H pylori   Upper EUS 03/08/11 see report scanned in chart  Upper EUS 01/23/15 see report scanned in chart  Upper EUS 01/24/17 GB sludge, no gallstones, bile duct normal, hyperechoic foci and strands in pancreas and lobularity but no parenchymal calcifications/pancreatic ductal dilatation noted, 2 small cysts in pancreas body 4-5 mm liver with 6 mm cyst no lymphadenopathy   Colonoscopy 08/02/13 hyperplastic polyp sigmoid colon and mild hyperplastic changes rectum   Colonoscopy 07/27/10 semi pedunculated polyp splenic flexure IH/EH  Colonoscopy 07/31/08 pedunculated polyp transverse colon few diminutive sessile polyps rectum diverticulosis IH/EH   Pt rec to have colonoscopy Q5 years   -we discussed possible serious and likely etiologies, options for evaluation and workup, limitations of telemedicine visit vs in person visit, treatment, treatment risks and precautions. Pt prefers to treat via telemedicine empirically rather then risking or undertaking an in person visit at this moment. Patient agrees to seek prompt in person care if worsening, new symptoms arise, or if is not improving with treatment.   I discussed the assessment and treatment plan with the patient. The patient was provided an opportunity to ask questions and all were answered. The patient agreed with the plan and demonstrated an understanding of the instructions.   The patient was advised to call  back or seek an in-person evaluation if the symptoms worsen or if the condition fails to improve as anticipated.  Time spent 20 minutes Delorise Gautreau, MD

## 2019-07-10 ENCOUNTER — Telehealth: Payer: Self-pay

## 2019-07-10 NOTE — Telephone Encounter (Signed)
Copied from [place] [id number]. Topic: General - Other >> Jul 10, 2019  9:59 AM Keene Breath wrote: Reason for CRM: Patient called to speak with the nurse or doctor regarding his shot records in [place], where is currently at.  Please call his to discuss at [phone]

## 2019-07-11 ENCOUNTER — Other Ambulatory Visit: Payer: Self-pay | Admitting: Internal Medicine

## 2019-07-11 ENCOUNTER — Telehealth: Payer: Self-pay

## 2019-07-11 DIAGNOSIS — Z1211 Encounter for screening for malignant neoplasm of colon: Secondary | ICD-10-CM

## 2019-07-11 NOTE — Telephone Encounter (Signed)
Referral sent [facility]   

## 2019-07-11 NOTE — Telephone Encounter (Signed)
Patient stated that the practice in [place], does not have any records but flu shot in his EMR.

## 2019-07-11 NOTE — Telephone Encounter (Signed)
Patient want colonoscopy referral for .

## 2019-08-04 ENCOUNTER — Telehealth: Payer: Self-pay

## 2019-08-04 ENCOUNTER — Other Ambulatory Visit: Payer: Self-pay

## 2019-08-04 DIAGNOSIS — Z86010 Personal history of colonic polyps: Secondary | ICD-10-CM

## 2019-08-04 DIAGNOSIS — Z1211 Encounter for screening for malignant neoplasm of colon: Secondary | ICD-10-CM

## 2019-08-04 NOTE — Telephone Encounter (Signed)
Gastroenterology Pre-Procedure Review  Request Date: Friday 08/18/19 Requesting Physician: Dr. Marius Ditch  PATIENT REVIEW QUESTIONS: The patient responded to the following health history questions as indicated:    1. Are you having any GI issues? no 2. Do you have a personal history of Polyps? yes (last colonoscopy was done in 2015 state of [place]) 3. Do you have a family history of Colon Cancer or Polyps? yes (paternal grandmother colon cancer, Paternal Uncle Colon Cancer) 4. Diabetes Mellitus? no 5. Joint replacements in the past 12 months?Patient has history of prostate cancer, spinal cord injury-recieves steroid injections for this 6. Major health problems in the past 3 months?no 7. Any artificial heart valves, MVP, or defibrillator?no    MEDICATIONS & ALLERGIES:    Patient reports the following regarding taking any anticoagulation/antiplatelet therapy:   Plavix, Coumadin, Eliquis, Xarelto, Lovenox, Pradaxa, Brilinta, or Effient? no Aspirin? no  Patient confirms/reports the following medications:  Current Outpatient Medications  Medication Sig Dispense Refill  . albuterol (PROAIR HFA) 108 (90 Base) MCG/ACT inhaler Inhale 1-2 puffs into the lungs every 6 (six) hours as needed for wheezing or shortness of breath.    Marland Kitchen atorvastatin (LIPITOR) 20 MG tablet Take 1 tablet (20 mg total) by mouth daily. 90 tablet 3  . bimatoprost (LUMIGAN) 0.01 % SOLN Place 1 drop into both eyes at bedtime. 1 drop affected eye 7.5 mL 5  . Cyanocobalamin (B-12) 500 MCG TABS Take 500 mcg by mouth daily. 90 tablet 3  . DYMISTA 137-50 MCG/ACT SUSP     . FOLIC ACID PO Take 1 mg by mouth daily.     Marland Kitchen gabapentin (NEURONTIN) 800 MG tablet Take 1 tablet (800 mg total) by mouth 3 (three) times daily.    Marland Kitchen losartan (COZAAR) 50 MG tablet Take 1 tablet (50 mg total) by mouth daily. 90 tablet 3  . oxyCODONE-acetaminophen (PERCOCET) 10-325 MG tablet Take 1 tablet by mouth every 8 (eight) hours as needed for pain. Dr. Vira [facility] in [place]    . pantoprazole (PROTONIX) 40 MG tablet pantoprazole 40 mg tablet,delayed release    . Vitamin D, Ergocalciferol, (DRISDOL) 1.25 MG (50000 UT) CAPS capsule      No current facility-administered medications for this visit.     Patient confirms/reports the following allergies:  Allergies  Allergen Reactions  . Percocet [Oxycodone-Acetaminophen]     Certain formulations itching      No orders of the defined types were placed in this encounter.   AUTHORIZATION INFORMATION Primary Insurance: 1D#: Group #:  Secondary Insurance: 1D#: Group #:  SCHEDULE INFORMATION: Date: Monday 08/18/19 Time: Location: [place]

## 2019-08-06 DIAGNOSIS — Z981 Arthrodesis status: Secondary | ICD-10-CM | POA: Insufficient documentation

## 2019-08-06 DIAGNOSIS — M5010 Cervical disc disorder with radiculopathy, unspecified cervical region: Secondary | ICD-10-CM | POA: Insufficient documentation

## 2019-08-06 DIAGNOSIS — G894 Chronic pain syndrome: Secondary | ICD-10-CM | POA: Insufficient documentation

## 2019-08-06 DIAGNOSIS — M961 Postlaminectomy syndrome, not elsewhere classified: Secondary | ICD-10-CM | POA: Insufficient documentation

## 2019-08-06 DIAGNOSIS — M5116 Intervertebral disc disorders with radiculopathy, lumbar region: Secondary | ICD-10-CM | POA: Insufficient documentation

## 2019-08-14 ENCOUNTER — Other Ambulatory Visit: Payer: Self-pay

## 2019-08-14 ENCOUNTER — Other Ambulatory Visit
Admission: RE | Admit: 2019-08-14 | Discharge: 2019-08-14 | Disposition: A | Discharge status: Home / Self Care | Payer: Medicare HMO | Source: Ambulatory Visit | Attending: Gastroenterology | Admitting: Gastroenterology

## 2019-08-14 DIAGNOSIS — Z20828 Contact with and (suspected) exposure to other viral communicable diseases: Secondary | ICD-10-CM | POA: Diagnosis not present

## 2019-08-14 DIAGNOSIS — Z01812 Encounter for preprocedural laboratory examination: Secondary | ICD-10-CM | POA: Diagnosis not present

## 2019-08-14 LAB — SARS CORONAVIRUS 2 (TAT 6-24 HRS): SARS Coronavirus 2: NEGATIVE

## 2019-08-18 ENCOUNTER — Other Ambulatory Visit: Payer: Self-pay

## 2019-08-18 ENCOUNTER — Ambulatory Visit: Payer: Medicare HMO | Admitting: Certified Registered"

## 2019-08-18 ENCOUNTER — Ambulatory Visit
Admission: RE | Admit: 2019-08-18 | Discharge: 2019-08-18 | Disposition: A | Discharge status: Home / Self Care | Payer: Medicare HMO | Attending: Gastroenterology | Admitting: Gastroenterology

## 2019-08-18 ENCOUNTER — Encounter
Admission: RE | Disposition: A | Discharge status: Home / Self Care | Payer: Self-pay | Source: Home / Self Care | Attending: Gastroenterology

## 2019-08-18 DIAGNOSIS — E785 Hyperlipidemia, unspecified: Secondary | ICD-10-CM | POA: Insufficient documentation

## 2019-08-18 DIAGNOSIS — D123 Benign neoplasm of transverse colon: Secondary | ICD-10-CM | POA: Insufficient documentation

## 2019-08-18 DIAGNOSIS — K5790 Diverticulosis of intestine, part unspecified, without perforation or abscess without bleeding: Secondary | ICD-10-CM | POA: Diagnosis not present

## 2019-08-18 DIAGNOSIS — Z87891 Personal history of nicotine dependence: Secondary | ICD-10-CM | POA: Diagnosis not present

## 2019-08-18 DIAGNOSIS — K635 Polyp of colon: Secondary | ICD-10-CM | POA: Diagnosis not present

## 2019-08-18 DIAGNOSIS — Z79899 Other long term (current) drug therapy: Secondary | ICD-10-CM | POA: Diagnosis not present

## 2019-08-18 DIAGNOSIS — I10 Essential (primary) hypertension: Secondary | ICD-10-CM | POA: Diagnosis not present

## 2019-08-18 DIAGNOSIS — K5730 Diverticulosis of large intestine without perforation or abscess without bleeding: Secondary | ICD-10-CM | POA: Diagnosis not present

## 2019-08-18 DIAGNOSIS — D125 Benign neoplasm of sigmoid colon: Secondary | ICD-10-CM | POA: Insufficient documentation

## 2019-08-18 DIAGNOSIS — D124 Benign neoplasm of descending colon: Secondary | ICD-10-CM | POA: Insufficient documentation

## 2019-08-18 DIAGNOSIS — H409 Unspecified glaucoma: Secondary | ICD-10-CM | POA: Insufficient documentation

## 2019-08-18 DIAGNOSIS — J45909 Unspecified asthma, uncomplicated: Secondary | ICD-10-CM | POA: Insufficient documentation

## 2019-08-18 DIAGNOSIS — Z86010 Personal history of colonic polyps: Secondary | ICD-10-CM | POA: Diagnosis not present

## 2019-08-18 DIAGNOSIS — Z8546 Personal history of malignant neoplasm of prostate: Secondary | ICD-10-CM | POA: Diagnosis not present

## 2019-08-18 DIAGNOSIS — Z1211 Encounter for screening for malignant neoplasm of colon: Secondary | ICD-10-CM | POA: Diagnosis not present

## 2019-08-18 HISTORY — PX: COLONOSCOPY WITH PROPOFOL: SHX5780

## 2019-08-18 SURGERY — COLONOSCOPY WITH PROPOFOL
Anesthesia: Monitor Anesthesia Care

## 2019-08-18 MED ORDER — SODIUM CHLORIDE 0.9 % IV SOLN
INTRAVENOUS | Status: DC
  Administered 2019-08-18: 11:00:00 via INTRAVENOUS

## 2019-08-18 MED ORDER — GLYCOPYRROLATE 0.2 MG/ML IJ SOLN
INTRAMUSCULAR | Status: DC | PRN
  Administered 2019-08-18: 0.2 mg via INTRAVENOUS

## 2019-08-18 MED ORDER — PROPOFOL 500 MG/50ML IV EMUL
INTRAVENOUS | Status: DC | PRN
  Administered 2019-08-18 (×2): 40 mg via INTRAVENOUS
  Administered 2019-08-18: 20 mg via INTRAVENOUS
  Administered 2019-08-18 (×4): 40 mg via INTRAVENOUS
  Administered 2019-08-18: 50 mg via INTRAVENOUS
  Administered 2019-08-18 (×3): 40 mg via INTRAVENOUS

## 2019-08-18 NOTE — Op Note (Signed)
[facility]enter[facility] Patient Name: Matthew Turner Procedure Date: 08/18/2019 11:01 AM MRN: [id number] Account #: [id number] Date of Birth: [birth year] Admit Type: Outpatient Age: 58 Room: [facility] ENDO ROOM 2 Gender: Male Note Status: Finalized Procedure:            Colonoscopy Indications:          Screening for colorectal malignant neoplasm Providers:            Lin Landsman MD, MD Medicines:            Monitored Anesthesia Care Complications:        No immediate complications. Estimated blood loss: None. Procedure:            Pre-Anesthesia Assessment:                       - Prior to the procedure, a History and Physical was                        performed, and patient medications and allergies were                        reviewed. The patient is competent. The risks and                        benefits of the procedure and the sedation options and                        risks were discussed with the patient. All questions                        were answered and informed consent was obtained.                        Patient identification and proposed procedure were                        verified by the physician, the nurse, the                        anesthesiologist, the anesthetist and the technician in                        the pre-procedure area in the procedure room in the                        endoscopy suite. Mental Status Examination: alert and                        oriented. Airway Examination: normal oropharyngeal                        airway and neck mobility. Respiratory Examination:                        clear to auscultation. CV Examination: normal.                        Prophylactic Antibiotics: The patient does not require  prophylactic antibiotics. Prior Anticoagulants: The                        patient has taken no previous anticoagulant or                        antiplatelet agents. ASA Grade  Assessment: II - A                        patient with mild systemic disease. After reviewing the                        risks and benefits, the patient was deemed in                        satisfactory condition to undergo the procedure. The                        anesthesia plan was to use monitored anesthesia care                        (MAC). Immediately prior to administration of                        medications, the patient was re-assessed for adequacy                        to receive sedatives. The heart rate, respiratory rate,                        oxygen saturations, blood pressure, adequacy of                        pulmonary ventilation, and response to care were                        monitored throughout the procedure. The physical status                        of the patient was re-assessed after the procedure.                       After obtaining informed consent, the colonoscope was                        passed under direct vision. Throughout the procedure,                        the patient's blood pressure, pulse, and oxygen                        saturations were monitored continuously. The                        Colonoscope was introduced through the anus and                        advanced to the the cecum, identified by appendiceal  orifice and ileocecal valve. The colonoscopy was                        performed without difficulty. The patient tolerated the                        procedure well. The quality of the bowel preparation                        was evaluated using the BBPS [facility] Bowel Preparation                        Scale) with scores of: Right Colon = 3, Transverse                        Colon = 3 and Left Colon = 3 (entire mucosa seen well                        with no residual staining, small fragments of stool or                        opaque liquid). The total BBPS score equals 9. Findings:      The perianal and  digital rectal examinations were normal. Pertinent       negatives include normal sphincter tone and no palpable rectal lesions.      A 7 mm polyp was found in the transverse colon. The polyp was       semi-pedunculated. The polyp was removed with a hot snare. Resection and       retrieval were complete.      A 6 mm polyp was found in the descending colon. The polyp was       semi-pedunculated. The polyp was removed with a cold snare. Resection       and retrieval were complete. To prevent bleeding after the polypectomy,       two hemostatic clips were successfully placed (MR conditional). There       was no bleeding at the end of the procedure.      A 3 mm polyp was found in the descending colon. The polyp was sessile.       The polyp was removed with a cold snare. Resection and retrieval were       complete.      A 15 mm polyp was found in the sigmoid colon. The polyp was       pedunculated. The polyp was removed with a hot snare. Resection and       retrieval were complete.      Multiple diverticula were found in the right colon.      The retroflexed view of the distal rectum and anal verge was normal and       showed no anal or rectal abnormalities. Impression:           - One 7 mm polyp in the transverse colon, removed with                        a hot snare. Resected and retrieved.                       - One 6 mm polyp in the descending colon, removed with  a cold snare. Resected and retrieved. Clips (MR                        conditional) were placed.                       - One 3 mm polyp in the descending colon, removed with                        a cold snare. Resected and retrieved.                       - One 15 mm polyp in the sigmoid colon, removed with a                        hot snare. Resected and retrieved.                       - Diverticulosis in the right colon.                       - The distal rectum and anal verge are normal on                         retroflexion view. Recommendation:       - Discharge patient to home (with escort).                       - Resume previous diet today.                       - Continue present medications.                       - Await pathology results.                       - Repeat colonoscopy in 3 years for surveillance of                        multiple polyps. Procedure Code(s):    --- Professional ---                       [id number], Colonoscopy, flexible; with removal of tumor(s),                        polyp(s), or other lesion(s) by snare technique Diagnosis Code(s):    --- Professional ---                       Z12.11, Encounter for screening for malignant neoplasm                        of colon                       K63.5, Polyp of colon                       K57.30, Diverticulosis of large intestine without                        perforation  or abscess without bleeding CPT copyright 2019 American Medical Association. All rights reserved. The codes documented in this report are preliminary and upon coder review may  be revised to meet current compliance requirements. Dr. Ulyess Mort Lin Landsman MD, MD 08/18/2019 11:52:16 AM This report has been signed electronically. Number of Addenda: 0 Note Initiated On: 08/18/2019 11:01 AM Scope Withdrawal Time: 0 hours 20 minutes 44 seconds  Total Procedure Duration: 0 hours 24 minutes 39 seconds  Estimated Blood Loss: Estimated blood loss: none.      [facility]

## 2019-08-18 NOTE — Transfer of Care (Signed)
Immediate Anesthesia Transfer of Care Note  Patient: Matthew Turner  Procedure(s) Performed: COLONOSCOPY WITH PROPOFOL (N/A )  Patient Location: PACU  Anesthesia Type:MAC  Level of Consciousness: drowsy and responds to stimulation  Airway & Oxygen Therapy: Patient Spontanous Breathing and Patient connected to nasal cannula oxygen  Post-op Assessment: Report given to RN and Post -op Vital signs reviewed and stable  Post vital signs: Reviewed and stable  Last Vitals:  Vitals Value Taken Time  BP 100/45 08/18/19 1153  Temp 36.1 C 08/18/19 1153  Pulse 88 08/18/19 1154  Resp 7 08/18/19 1154  SpO2 97 % 08/18/19 1154  Vitals shown include unvalidated device data.  Last Pain:  Vitals:   08/18/19 1153  TempSrc: Tympanic  PainSc: 0-No pain         Complications: No apparent anesthesia complications

## 2019-08-18 NOTE — Anesthesia Postprocedure Evaluation (Signed)
Anesthesia Post Note  Patient: Matthew Turner  Procedure(s) Performed: COLONOSCOPY WITH PROPOFOL (N/A )  Patient location during evaluation: Endoscopy Anesthesia Type: MAC Level of consciousness: awake and alert Pain management: pain level controlled Vital Signs Assessment: post-procedure vital signs reviewed and stable Respiratory status: spontaneous breathing and respiratory function stable Cardiovascular status: stable Anesthetic complications: no     Last Vitals:  Vitals:   08/18/19 1020 08/18/19 1153  BP: 129/87 (!) 100/45  Pulse: 88 94  Resp: 20 13  Temp: (!) 35.8 C (!) 36.1 C  SpO2: 100% 96%    Last Pain:  Vitals:   08/18/19 1153  TempSrc: Tympanic  PainSc: 0-No pain                 KEPHART,WILLIAM K

## 2019-08-18 NOTE — Anesthesia Post-op Follow-up Note (Signed)
Anesthesia QCDR form completed.        

## 2019-08-18 NOTE — H&P (Signed)
Matthew Darby, MD [address]  [place]  [place], [place] [address]  Main: [phone]  Fax: [phone] Pager: [phone]  Primary Care Physician:  McLean-Scocuzza, Nino Glow, MD Primary Gastroenterologist:  Dr. Cephas Turner  Pre-Procedure History & Physical: HPI:  Matthew Turner is a 58 y.o. male is here for an colonoscopy.   Past Medical History:  Diagnosis Date  . Asthma    dust  . Cancer ([place])    prostate; early 50s   . Chronic pain    f/u pain clinic in [place]; arms, neck, back-pain clinic in [place]  . Glaucoma   . HLD (hyperlipidemia)   . Hypertension   . Spinal cord injury, C5-C7 ([place])    C6.C7 in 2008 injury at work he was paralyzed from waist down but walking again;workers comp    Past Surgical History:  Procedure Laterality Date  . LUMBAR LAMINECTOMY    . PROSTATECTOMY     had radiation no injections h/o prostate pump     Prior to Admission medications   Medication Sig Start Date End Date Taking? Authorizing Provider  albuterol (PROAIR HFA) 108 (90 Base) MCG/ACT inhaler Inhale 1-2 puffs into the lungs every 6 (six) hours as needed for wheezing or shortness of breath. 03/28/19  Yes McLean-Scocuzza, Nino Glow, MD  atorvastatin (LIPITOR) 20 MG tablet Take 1 tablet (20 mg total) by mouth daily. 03/28/19  Yes McLean-Scocuzza, Nino Glow, MD  bimatoprost (LUMIGAN) 0.01 % SOLN Place 1 drop into both eyes at bedtime. 1 drop affected eye 07/09/19  Yes McLean-Scocuzza, Nino Glow, MD  Cyanocobalamin (B-12) 500 MCG TABS Take 500 mcg by mouth daily. 03/28/19  Yes McLean-Scocuzza, Nino Glow, MD  DYMISTA 137-50 MCG/ACT SUSP  11/06/18  Yes [provider]  FOLIC ACID PO Take 1 mg by mouth daily.    Yes [provider]  gabapentin (NEURONTIN) 800 MG tablet Take 1 tablet (800 mg total) by mouth 3 (three) times daily. 03/28/19  Yes McLean-Scocuzza, Nino Glow, MD  losartan (COZAAR) 50 MG tablet Take 1 tablet (50 mg total) by mouth daily. 03/28/19  Yes McLean-Scocuzza, Nino Glow,  MD  oxyCODONE-acetaminophen (PERCOCET) 10-325 MG tablet Take 1 tablet by mouth every 8 (eight) hours as needed for pain. Dr. Vira [facility] in [facility]   Yes [provider]  pantoprazole (PROTONIX) 40 MG tablet pantoprazole 40 mg tablet,delayed release   Yes [provider]  Vitamin D, Ergocalciferol, (DRISDOL) 1.25 MG (50000 UT) CAPS capsule  11/06/18  Yes [provider]    Allergies as of 08/04/2019 - Review Complete 07/09/2019  Allergen Reaction Noted  . Percocet [oxycodone-acetaminophen]  04/01/2019    Family History  Problem Relation Age of Onset  . Heart failure Mother   . Cancer Father        prostate  . Cancer Brother        prostate  . Pancreatic cancer Paternal Grandmother   . Prostate cancer Other   . Prostate cancer Brother        prostate    Social History   Socioeconomic History  . Marital status: Married    Spouse name: Not on file  . Number of children: Not on file  . Years of education: Not on file  . Highest education level: Not on file  Occupational History  . Not on file  Social Needs  . Financial resource strain: Not on file  . Food insecurity    Worry: Not on  file    Inability: Not on file  . Transportation needs    Medical: Not on file    Non-medical: Not on file  Tobacco Use  . Smoking status: Former Research scientist (life sciences)  . Smokeless tobacco: Never Used  Substance and Sexual Activity  . Alcohol use: Yes    Comment: Occasionally   . Drug use: Not on file  . Sexual activity: Not on file  Lifestyle  . Physical activity    Days per week: Not on file    Minutes per session: Not on file  . Stress: Not on file  Relationships  . Social Herbalist on phone: Not on file    Gets together: Not on file    Attends religious service: Not on file    Active member of club or organization: Not on file    Attends meetings of clubs or organizations: Not on file    Relationship status: Not on file  . Intimate  partner violence    Fear of current or ex partner: Not on file    Emotionally abused: Not on file    Physically abused: Not on file    Forced sexual activity: Not on file  Other Topics Concern  . Not on file  Social History Narrative   Former smoker quit 38    Occasional etoh    Married with 2 sons and 1 daughter    From [place]   Retired     Review of Systems: See HPI, otherwise negative ROS  Physical Exam: BP 129/87   Pulse 88   Temp (!) 96.5 F (35.8 C) (Tympanic)   Resp 20   Ht 5\' 10"  (1.778 m)   Wt 100.7 kg   SpO2 100%   BMI 31.85 kg/m  General:   Alert,  pleasant and cooperative in NAD Head:  Normocephalic and atraumatic. Neck:  Supple; no masses or thyromegaly. Lungs:  Clear throughout to auscultation.    Heart:  Regular rate and rhythm. Abdomen:  Soft, nontender and nondistended. Normal bowel sounds, without guarding, and without rebound.   Neurologic:  Alert and  oriented x4;  grossly normal neurologically.  Impression/Plan: Matthew Turner is here for an colonoscopy to be performed for colon cancer screening  Risks, benefits, limitations, and alternatives regarding  colonoscopy have been reviewed with the patient.  Questions have been answered.  All parties agreeable.   Sherri Sear, MD  08/18/2019, 10:36 AM

## 2019-08-18 NOTE — Anesthesia Preprocedure Evaluation (Signed)
Anesthesia Evaluation  Patient identified by MRN, date of birth, ID band Patient awake    Reviewed: Allergy & Precautions, NPO status , Patient's Chart, lab work & pertinent test results  History of Anesthesia Complications Negative for: history of anesthetic complications  Airway Mallampati: III       Dental  (+) Missing, Chipped   Pulmonary asthma (with dust, inhlaer used last week) , neg sleep apnea, neg COPD, former smoker,           Cardiovascular hypertension, Pt. on medications (-) Past MI and (-) CHF (-) dysrhythmias (-) Valvular Problems/Murmurs     Neuro/Psych neg Seizures    GI/Hepatic Neg liver ROS, neg GERD  ,  Endo/Other  neg diabetes  Renal/GU negative Renal ROS     Musculoskeletal   Abdominal   Peds  Hematology   Anesthesia Other Findings   Reproductive/Obstetrics                             Anesthesia Physical Anesthesia Plan  ASA: II  Anesthesia Plan: General   Post-op Pain Management:    Induction: Intravenous  PONV Risk Score and Plan: 2 and Propofol infusion and TIVA  Airway Management Planned: Nasal Cannula  Additional Equipment:   Intra-op Plan:   Post-operative Plan:   Informed Consent: I have reviewed the patients History and Physical, chart, labs and discussed the procedure including the risks, benefits and alternatives for the proposed anesthesia with the patient or authorized representative who has indicated his/her understanding and acceptance.       Plan Discussed with:   Anesthesia Plan Comments:         Anesthesia Quick Evaluation

## 2019-08-19 ENCOUNTER — Encounter: Payer: Self-pay | Admitting: Gastroenterology

## 2019-08-19 LAB — SURGICAL PATHOLOGY

## 2019-09-22 ENCOUNTER — Other Ambulatory Visit: Payer: Self-pay

## 2019-09-22 ENCOUNTER — Other Ambulatory Visit: Payer: Self-pay | Admitting: Internal Medicine

## 2019-09-22 ENCOUNTER — Ambulatory Visit (INDEPENDENT_AMBULATORY_CARE_PROVIDER_SITE_OTHER): Payer: Medicare HMO

## 2019-09-22 DIAGNOSIS — Z0000 Encounter for general adult medical examination without abnormal findings: Secondary | ICD-10-CM | POA: Diagnosis not present

## 2019-09-22 DIAGNOSIS — D72819 Decreased white blood cell count, unspecified: Secondary | ICD-10-CM

## 2019-09-22 DIAGNOSIS — I10 Essential (primary) hypertension: Secondary | ICD-10-CM

## 2019-09-22 NOTE — Progress Notes (Signed)
Subjective:   Matthew Turner is a 58 y.o. male who presents for an Initial Medicare Annual Wellness Visit.  Review of Systems  No ROS.  Medicare Wellness Virtual Visit.  Visual/audio telehealth visit, UTA vital signs.   See social history for additional risk factors.   Cardiac Risk Factors include: advanced age (>54men, >27 women);male gender;hypertension    Objective:    Today's Vitals   There is no height or weight on file to calculate BMI.  Advanced Directives 09/22/2019 08/18/2019  Does Patient Have a Medical Advance Directive? No No  Would patient like information on creating a medical advance directive? Yes (MAU/Ambulatory/Procedural Areas - Information given) -    Current Medications (verified) Outpatient Encounter Medications as of 09/22/2019  Medication Sig  . albuterol (PROAIR HFA) 108 (90 Base) MCG/ACT inhaler Inhale 1-2 puffs into the lungs every 6 (six) hours as needed for wheezing or shortness of breath.  Marland Kitchen atorvastatin (LIPITOR) 20 MG tablet Take 1 tablet (20 mg total) by mouth daily.  . bimatoprost (LUMIGAN) 0.01 % SOLN Place 1 drop into both eyes at bedtime. 1 drop affected eye  . Cyanocobalamin (B-12) 500 MCG TABS Take 500 mcg by mouth daily.  Marland Kitchen DYMISTA 137-50 MCG/ACT SUSP   . gabapentin (NEURONTIN) 800 MG tablet Take 1 tablet (800 mg total) by mouth 3 (three) times daily.  Marland Kitchen losartan (COZAAR) 50 MG tablet Take 1 tablet (50 mg total) by mouth daily.  Marland Kitchen oxyCODONE-acetaminophen (PERCOCET) 10-325 MG tablet Take 1 tablet by mouth every 8 (eight) hours as needed for pain. Dr. Vira [facility] [place]  . Vitamin D, Ergocalciferol, (DRISDOL) 1.25 MG (50000 UT) CAPS capsule   . [DISCONTINUED] FOLIC ACID PO Take 1 mg by mouth daily.   . [DISCONTINUED] pantoprazole (PROTONIX) 40 MG tablet pantoprazole 40 mg tablet,delayed release   No facility-administered encounter medications on file as of 09/22/2019.     Allergies (verified) Percocet  [oxycodone-acetaminophen]   History: Past Medical History:  Diagnosis Date  . Asthma    dust  . Cancer ([place])    prostate; early 43s   . Chronic pain    f/u pain clinic in [place]; arms, neck, back-pain clinic in [place]  . Glaucoma   . HLD (hyperlipidemia)   . Hypertension   . Spinal cord injury, C5-C7 ([place])    C6.C7 in 2008 injury at work he was paralyzed from waist down but walking again;workers comp   Past Surgical History:  Procedure Laterality Date  . COLONOSCOPY WITH PROPOFOL N/A 08/18/2019   Procedure: COLONOSCOPY WITH PROPOFOL;  Surgeon: Lin Landsman, MD;  Location: [facility] ENDOSCOPY;  Service: Gastroenterology;  Laterality: N/A;  . LUMBAR LAMINECTOMY    . PROSTATECTOMY     had radiation no injections h/o prostate pump    Family History  Problem Relation Age of Onset  . Heart failure Mother   . Cancer Father        prostate  . Cancer Brother        prostate  . Pancreatic cancer Paternal Grandmother   . Prostate cancer Other   . Prostate cancer Brother        prostate   Social History   Socioeconomic History  . Marital status: Married    Spouse name: Not on file  . Number of children: Not on file  . Years of education: Not on file  . Highest education level: Not on file  Occupational History  . Not on file  Social Needs  . Financial resource strain: Not hard at all  . Food insecurity    Worry: Never true    Inability: Never true  . Transportation needs    Medical: No    Non-medical: No  Tobacco Use  . Smoking status: Former Research scientist (life sciences)  . Smokeless tobacco: Never Used  Substance and Sexual Activity  . Alcohol use: Yes    Comment: Occasionally   . Drug use: Not on file  . Sexual activity: Not on file  Lifestyle  . Physical activity    Days per week: 3 days    Minutes per session: 20 min  . Stress: Not at all  Relationships  . Social Herbalist on phone: Not on file    Gets together: Not on file    Attends religious service: Not on file     Active member of club or organization: Not on file    Attends meetings of clubs or organizations: Not on file    Relationship status: Not on file  Other Topics Concern  . Not on file  Social History Narrative   Former smoker quit 38    Occasional etoh    Married with 2 sons and 1 daughter    From [place]   Retired    Tobacco Counseling Counseling given: Not Answered   Clinical Intake:  Pre-visit preparation completed: Yes        Diabetes: No  How often do you need to have someone help you when you read instructions, pamphlets, or other written materials from your doctor or pharmacy?: 1 - Never  Interpreter Needed?: No     Activities of Daily Living In your present state of health, do you have any difficulty performing the following activities: 09/22/2019 07/09/2019  Hearing? N N  Vision? N N  Difficulty concentrating or making decisions? N N  Walking or climbing stairs? N N  Dressing or bathing? N N  Doing errands, shopping? N N  Preparing Food and eating ? N -  Using the Toilet? N -  In the past six months, have you accidently leaked urine? N -  Do you have problems with loss of bowel control? N -  Managing your Medications? N -  Managing your Finances? N -  Housekeeping or managing your Housekeeping? N -  Some recent data might be hidden     Immunizations and Health Maintenance Immunization History  Administered Date(s) Administered  . Influenza Inj Mdck Quad Pf 06/26/2019   Health Maintenance Due  Topic Date Due  . Samul Dada  [birth year]1978    Patient Care Team: McLean-Scocuzza, Nino Glow, MD as PCP - General (Internal Medicine)  Indicate any recent Medical Services you may have received from other than [facility] providers in the past year (date may be approximate).    Assessment:   This is a routine wellness examination for Matthew Turner.  Nurse connected with patient 09/22/19 at  8:30 AM EST by a telephone enabled telemedicine application and verified that I am  speaking with the correct person using two identifiers. Patient stated full name and DOB. Patient gave permission to continue with virtual visit. Patient's location was at home and Nurse's location was at [place] office.   Health Maintenance Due: -Tdap- discussed; to be completed with doctor in visit or local pharmacy.   Update all pending maintenance due as appropriate.   See completed HM at the end of note.   Eye: Visual acuity not assessed. Virtual visit.  He plans  to schedule an exam.  Dental: He plans to schedule an exam.  Hearing: Demonstrates normal hearing during visit.  Safety:  Patient feels safe at home- yes Patient does have smoke detectors at home- yes Patient does wear sunscreen or protective clothing when in direct sunlight - yes Patient does wear seat belt when in a moving vehicle - yes Patient drives- yes Adequate lighting in walkways free from debris- yes Grab bars and handrails used as appropriate- yes Ambulates with no assistive device Cell phone on person when ambulating outside of the home- yes  Social: Alcohol intake - yes      Smoking history- former    Smokers in home? none Illicit drug use? none  Depression: PHQ 2 &9 complete. See screening below. Denies irritability, anhedonia, sadness/tearfullness.    Falls: See screening below.    Medication: Taking as directed and without issues.   Covid-19: Precautions and sickness symptoms discussed. Wears mask, social distancing, hand hygiene as appropriate.   Activities of Daily Living Patient denies needing assistance with: household chores, feeding themselves, getting from bed to chair, getting to the toilet, bathing/showering, dressing, managing money, or preparing meals.   Memory: Patient is alert. Patient denies difficulty focusing or concentrating. Correctly identified the president of the [place], season and recall. Patient likes to read for brain stimulation.   BMI- discussed the importance  of a healthy diet, water intake and the benefits of aerobic exercise.  Educational material provided.  Physical activity- post physical therapy exercises and active around the home.   Diet:  Regular Water: 64 ounces  Other Providers Patient Care Team: McLean-Scocuzza, Nino Glow, MD as PCP - General (Internal Medicine)   Hearing/Vision screen  Hearing Screening   125Hz  250Hz  500Hz  1000Hz  2000Hz  3000Hz  4000Hz  6000Hz  8000Hz   Right ear:           Left ear:           Comments: Patient is able to hear conversational tones without difficulty.  No issues reported.  Vision Screening Comments: Visual acuity not assessed, virtual visit.      Dietary issues and exercise activities discussed: Current Exercise Habits: Home exercise routine, Type of exercise: stretching(post physical therapy exercises, balancing), Intensity: Mild  Goals      Patient Stated   . Follow up with Primary Care Provider (pt-stated)     Schedule eye and dental exam      Depression Screen PHQ 2/9 Scores 09/22/2019  PHQ - 2 Score 0    Fall Risk Fall Risk  09/22/2019 03/28/2019  Falls in the past year? 0 0  Follow up Falls prevention discussed;Education provided -   Timed Get Up and Go performed: no, virtual visit  Cognitive Function:     6CIT Screen 09/22/2019  What Year? 0 points  What month? 0 points  What time? 0 points  Count back from 20 0 points  Months in reverse 0 points  Repeat phrase 0 points  Total Score 0    Screening Tests Health Maintenance  Topic Date Due  . TETANUS/TDAP  05/04/1978  . COLONOSCOPY  08/17/2022  . INFLUENZA VACCINE  Completed  . Hepatitis C Screening  Completed  . HIV Screening  Completed       Plan:   Keep all routine maintenance appointments.   Next scheduled lab 10/08/19 @ 9:00  Follow up 4 month 10/29/19 @ 9:00  Medicare Attestation I have personally reviewed: The patient's medical and social history Their use of alcohol, tobacco or illicit drugs  Their current medications and supplements The patient's functional ability including ADLs,fall risks, home safety risks, cognitive, and hearing and visual impairment Diet and physical activities Evidence for depression   In addition, I have reviewed and discussed with patient certain preventive protocols, quality metrics, and best practice recommendations. A written personalized care plan for preventive services as well as general preventive health recommendations were provided to patient via mail.     Varney Biles, LPN   [id number]

## 2019-09-22 NOTE — Patient Instructions (Addendum)
  Matthew Turner , Thank you for taking time to come for your Medicare Wellness Visit. I appreciate your ongoing commitment to your health goals. Please review the following plan we discussed and let me know if I can assist you in the future.   These are the goals we discussed: Goals      Patient Stated   . Follow up with Primary Care Provider (pt-stated)     Schedule eye and dental exam       This is a list of the screening recommended for you and due dates:  Health Maintenance  Topic Date Due  . Tetanus Vaccine  05/04/1978  . Colon Cancer Screening  08/17/2022  . Flu Shot  Completed  .  Hepatitis C: One time screening is recommended by Center for Disease Control  (CDC) for  adults born from [birth year] through 1965.   Completed  . HIV Screening  Completed

## 2019-10-08 ENCOUNTER — Ambulatory Visit: Payer: Medicare HMO

## 2019-10-08 ENCOUNTER — Other Ambulatory Visit (INDEPENDENT_AMBULATORY_CARE_PROVIDER_SITE_OTHER): Payer: Medicare HMO

## 2019-10-08 ENCOUNTER — Other Ambulatory Visit: Payer: Self-pay

## 2019-10-08 DIAGNOSIS — I10 Essential (primary) hypertension: Secondary | ICD-10-CM | POA: Diagnosis not present

## 2019-10-08 DIAGNOSIS — D72819 Decreased white blood cell count, unspecified: Secondary | ICD-10-CM

## 2019-10-08 LAB — CBC WITH DIFFERENTIAL/PLATELET
Basophils Absolute: 0 K/uL (ref 0.0–0.1)
Basophils Relative: 0.2 % (ref 0.0–3.0)
Eosinophils Absolute: 0.1 K/uL (ref 0.0–0.7)
Eosinophils Relative: 1.6 % (ref 0.0–5.0)
HCT: 42.2 % (ref 39.0–52.0)
Hemoglobin: 14 g/dL (ref 13.0–17.0)
Lymphocytes Relative: 28.7 % (ref 12.0–46.0)
Lymphs Abs: 2.2 K/uL (ref 0.7–4.0)
MCHC: 33.2 g/dL (ref 30.0–36.0)
MCV: 91.2 fl (ref 78.0–100.0)
Monocytes Absolute: 0.5 K/uL (ref 0.1–1.0)
Monocytes Relative: 6.2 % (ref 3.0–12.0)
Neutro Abs: 4.8 K/uL (ref 1.4–7.7)
Neutrophils Relative %: 63.3 % (ref 43.0–77.0)
Platelets: 232 K/uL (ref 150.0–400.0)
RBC: 4.62 Mil/uL (ref 4.22–5.81)
RDW: 13.1 % (ref 11.5–15.5)
WBC: 7.6 K/uL (ref 4.0–10.5)

## 2019-10-08 LAB — COMPREHENSIVE METABOLIC PANEL WITH GFR
ALT: 19 U/L (ref 0–53)
AST: 13 U/L (ref 0–37)
Albumin: 4.4 g/dL (ref 3.5–5.2)
Alkaline Phosphatase: 83 U/L (ref 39–117)
BUN: 15 mg/dL (ref 6–23)
CO2: 32 meq/L (ref 19–32)
Calcium: 9.1 mg/dL (ref 8.4–10.5)
Chloride: 103 meq/L (ref 96–112)
Creatinine, Ser: 1.02 mg/dL (ref 0.40–1.50)
GFR: 90.01 mL/min
Glucose, Bld: 93 mg/dL (ref 70–99)
Potassium: 4 meq/L (ref 3.5–5.1)
Sodium: 140 meq/L (ref 135–145)
Total Bilirubin: 1.2 mg/dL (ref 0.2–1.2)
Total Protein: 6.8 g/dL (ref 6.0–8.3)

## 2019-10-08 LAB — LIPID PANEL
Cholesterol: 176 mg/dL (ref 0–200)
HDL: 61.1 mg/dL
LDL Cholesterol: 101 mg/dL — ABNORMAL HIGH (ref 0–99)
NonHDL: 115.28
Total CHOL/HDL Ratio: 3
Triglycerides: 70 mg/dL (ref 0.0–149.0)
VLDL: 14 mg/dL (ref 0.0–40.0)

## 2019-10-09 ENCOUNTER — Ambulatory Visit (INDEPENDENT_AMBULATORY_CARE_PROVIDER_SITE_OTHER): Payer: Medicare HMO | Admitting: Internal Medicine

## 2019-10-09 ENCOUNTER — Encounter: Payer: Self-pay | Admitting: Internal Medicine

## 2019-10-09 VITALS — Ht 70.0 in | Wt 220.0 lb

## 2019-10-09 DIAGNOSIS — H409 Unspecified glaucoma: Secondary | ICD-10-CM

## 2019-10-09 DIAGNOSIS — M5416 Radiculopathy, lumbar region: Secondary | ICD-10-CM | POA: Diagnosis not present

## 2019-10-09 NOTE — Progress Notes (Signed)
Virtual Visit via Video Note  I connected with Matthew Turner  on 10/09/19 at  1:15 PM EST by a video enabled telemedicine application and verified that I am speaking with the correct person using two identifiers.  Location patient: home Location provider:work or home office Persons participating in the virtual visit: patient, provider  I discussed the limitations of evaluation and management by telemedicine and the availability of in person appointments. The patient expressed understanding and agreed to proceed.   HPI: 1. 2 weeks ago went to [employer] and grabbed water and twisted wrong and had belt on his side 2 days later on Tuesday he was vacuuming car and had right lower back worse with squatting and pain so bad felt nauseated and pain radiated down right lower back to right side and buttocks and the area was swollen but swelling now better since been on Cadista/methylprednisone 4 mg taper 6 pills down to 1 pill and completed last dose today. Tried ice/icey hot, green rubbing alcohol w/o help and h/o back injections with pain clinic in [place] and 07/2019 had epidural injection to neck in [place] C6.  2. Glaucoma needs to see eye specialist in [place]    ROS: See pertinent positives and negatives per HPI.  Past Medical History:  Diagnosis Date  . Asthma    dust  . Cancer ([place])    prostate; early 25s   . Chronic pain    f/u pain clinic in [place]; arms, neck, back-pain clinic in [place]  . Glaucoma   . HLD (hyperlipidemia)   . Hypertension   . Spinal cord injury, C5-C7 ([place])    C6.C7 in 2008 injury at work he was paralyzed from waist down but walking again;workers comp    Past Surgical History:  Procedure Laterality Date  . COLONOSCOPY WITH PROPOFOL N/A 08/18/2019   Procedure: COLONOSCOPY WITH PROPOFOL;  Surgeon: Lin Landsman, MD;  Location[facility] ENDOSCOPY;  Service: Gastroenterology;  Laterality: N/A;  . LUMBAR LAMINECTOMY    . PROSTATECTOMY     had radiation no injections h/o prostate pump      Family History  Problem Relation Age of Onset  . Heart failure Mother   . Cancer Father        prostate  . Cancer Brother        prostate  . Pancreatic cancer Paternal Grandmother   . Prostate cancer Other   . Prostate cancer Brother        prostate    SOCIAL HX:  Former smoker quit 38  Occasional etoh  Married with 2 sons and 1 daughter  From [place] Retired    Current Outpatient Medications:  .  albuterol (PROAIR HFA) 108 (90 Base) MCG/ACT inhaler, Inhale 1-2 puffs into the lungs every 6 (six) hours as needed for wheezing or shortness of breath., Disp: , Rfl:  .  atorvastatin (LIPITOR) 20 MG tablet, Take 1 tablet (20 mg total) by mouth daily., Disp: 90 tablet, Rfl: 3 .  bimatoprost (LUMIGAN) 0.01 % SOLN, Place 1 drop into both eyes at bedtime. 1 drop affected eye, Disp: 7.5 mL, Rfl: 5 .  Cyanocobalamin (B-12) 500 MCG TABS, Take 500 mcg by mouth daily., Disp: 90 tablet, Rfl: 3 .  DYMISTA 137-50 MCG/ACT SUSP, , Disp: , Rfl:  .  gabapentin (NEURONTIN) 800 MG tablet, Take 1 tablet (800 mg total) by mouth 3 (three) times daily., Disp: , Rfl:  .  losartan (COZAAR) 50 MG tablet, Take 1 tablet (50 mg total) by mouth daily.,  Disp: 90 tablet, Rfl: 3 .  methylPREDNISolone (MEDROL DOSEPAK) 4 MG TBPK tablet, See admin instructions. follow package directions, Disp: , Rfl:  .  oxyCODONE-acetaminophen (PERCOCET) 10-325 MG tablet, Take 1 tablet by mouth every 8 (eight) hours as needed for pain. Dr. Vira [facility] in [place], Disp: , Rfl:  .  Vitamin D, Ergocalciferol, (DRISDOL) 1.25 MG (50000 UT) CAPS capsule, , Disp: , Rfl:   EXAM:  VITALS per patient if applicable:  GENERAL: alert, oriented, appears well and in no acute distress  HEENT: atraumatic, conjunttiva clear, no obvious abnormalities on inspection of external nose and ears  NECK: normal movements of the head and neck  LUNGS: on inspection no signs of respiratory distress, breathing rate appears normal, no  obvious gross SOB, gasping or wheezing  CV: no obvious cyanosis  MS: moves all visible extremities without noticeable abnormality  PSYCH/NEURO: pleasant and cooperative, no obvious depression or anxiety, speech and thought processing grossly intact  ASSESSMENT AND PLAN:  Discussed the following assessment and plan:  Lumbar radiculopathy -call back in 1 week if not better will do MRI and consider ortho/PT Declines muscle relaxers and further w/u at this time  Glaucoma, unspecified glaucoma type, unspecified laterality - Plan: Ambulatory referral to Ophthalmology Dr. Vernona Rieger   HM  Flu shot utd  Consider prevnar in future  Tdap as well and shingrix   Get vaccine records [place] -[facility] [place] Colonoscopy 08/18/19 tubular adenoma f/u in 3 years    Undetectable PSA 07/02/2019 h/o prostate cancer with h/o removal and radiation no injections Former smoker quit in 37s  Occasional etoh   Given names of eye MD and dentists in the area  [place] PCP Dr. Genia Plants [place] [address] [place]   EGD 09/25/10 esophagitis duodenitis mildly enlarged major papilla path neg h pylori chronic peptic duodentitis c/w adenomatous change; GERD   EGD path 06/05/12 gastric antrum bx negative duodenum negative no H pylori   EGD 01/23/15 mild erythema in stomach   EGD 01/24/17 -see report scanned in  path 01/24/17 gastric antrum bx and duodenum bx negative no H pylori   Upper EUS 03/08/11 see report scanned in chart  Upper EUS 01/23/15 see report scanned in chart  Upper EUS 01/24/17 GB sludge, no gallstones, bile duct normal, hyperechoic foci and strands in pancreas and lobularity but no parenchymal calcifications/pancreatic ductal dilatation noted, 2 small cysts in pancreas body 4-5 mm liver with 6 mm cyst no lymphadenopathy   Colonoscopy 08/02/13 hyperplastic polyp sigmoid colon and mild hyperplastic changes rectum    Colonoscopy 07/27/10 semi pedunculated polyp splenic flexure IH/EH  Colonoscopy 07/31/08 pedunculated polyp transverse colon few diminutive sessile polyps rectum diverticulosis IH/EH    -we discussed possible serious and likely etiologies, options for evaluation and workup, limitations of telemedicine visit vs in person visit, treatment, treatment risks and precautions. Pt prefers to treat via telemedicine empirically rather then risking or undertaking an in person visit at this moment. Patient agrees to seek prompt in person care if worsening, new symptoms arise, or if is not improving with treatment.   I discussed the assessment and treatment plan with the patient. The patient was provided an opportunity to ask questions and all were answered. The patient agreed with the plan and demonstrated an understanding of the instructions.   The patient was advised to call back or seek an in-person evaluation if the symptoms  worsen or if the condition fails to improve as anticipated.  Time spent 20 minutes  Delorise Cargle, MD

## 2019-10-10 ENCOUNTER — Other Ambulatory Visit: Payer: Self-pay | Admitting: Internal Medicine

## 2019-10-10 DIAGNOSIS — E785 Hyperlipidemia, unspecified: Secondary | ICD-10-CM

## 2019-10-10 MED ORDER — ATORVASTATIN CALCIUM 20 MG PO TABS: 20.0000 mg | ORAL_TABLET | Freq: Every day | ORAL | 3 refills | Status: DC

## 2019-10-29 ENCOUNTER — Other Ambulatory Visit: Payer: Self-pay

## 2019-10-29 ENCOUNTER — Ambulatory Visit (INDEPENDENT_AMBULATORY_CARE_PROVIDER_SITE_OTHER): Payer: Medicare HMO | Admitting: Internal Medicine

## 2019-10-29 DIAGNOSIS — H409 Unspecified glaucoma: Secondary | ICD-10-CM | POA: Diagnosis not present

## 2019-10-29 DIAGNOSIS — I10 Essential (primary) hypertension: Secondary | ICD-10-CM

## 2019-10-29 DIAGNOSIS — M25551 Pain in right hip: Secondary | ICD-10-CM

## 2019-10-29 DIAGNOSIS — H269 Unspecified cataract: Secondary | ICD-10-CM

## 2019-10-29 DIAGNOSIS — M7918 Myalgia, other site: Secondary | ICD-10-CM | POA: Diagnosis not present

## 2019-10-29 DIAGNOSIS — M5416 Radiculopathy, lumbar region: Secondary | ICD-10-CM | POA: Diagnosis not present

## 2019-10-29 NOTE — Progress Notes (Signed)
Virtual Visit via Video Note  I connected with Matthew Turner  on 10/29/19 at  9:05 AM EST by a video enabled telemedicine application and verified that I am speaking with the correct person using two identifiers.  Location patient: home Location provider:work or home office Persons participating in the virtual visit: patient, provider  I discussed the limitations of evaluation and management by telemedicine and the availability of in person appointments. The patient expressed understanding and agreed to proceed.   HPI: 1. F/u from 10/09/19 right lower back to right side hip and and right buttocks pain, area was swollen but swelling now better since been on Cadista/methylprednisone now off. Tried ice/icey hot, green rubbing alcohol w/o help and h/o back injections with pain clinic in [place] and 07/2019 had epidural injection to neck in [place] C6. He has an appt 11/05/2019 in [place] pain clinic  Today he is still having right hip and right low back pain 5/10 at most 8/10 pain comes and goes feels like a tear w/o leg weakness, pain is nagging. Right back pain radiating to right hip which affects daily activities and he heard a pop and felt like a tear in the muscle with swelling  He will see his pain clinic in [facility] 11/05/19 epidural injections not helping currently and he is s/p back surgeries   2. HTN on losartan 50 mg qd not checking BP  3. H/o glaucoma and cataract appt with AE 11/2019   ROS: See pertinent positives and negatives per HPI.  Past Medical History:  Diagnosis Date  . Asthma    dust  . Cancer ([place])    prostate; early 83s   . Chronic pain    f/u pain clinic in [place]; arms, neck, back-pain clinic in [place]  . Glaucoma   . HLD (hyperlipidemia)   . Hypertension   . Spinal cord injury, C5-C7 ([place])    C6.C7 in 2008 injury at work he was paralyzed from waist down but walking again;workers comp    Past Surgical History:  Procedure Laterality Date  . COLONOSCOPY WITH PROPOFOL N/A 08/18/2019   Procedure: COLONOSCOPY WITH PROPOFOL;  Surgeon: Lin Landsman, MD;  Location: [facility] ENDOSCOPY;  Service: Gastroenterology;  Laterality: N/A;  . LUMBAR LAMINECTOMY    . PROSTATECTOMY     had radiation no injections h/o prostate pump     Family History  Problem Relation Age of Onset  . Heart failure Mother   . Cancer Father        prostate  . Cancer Brother        prostate  . Pancreatic cancer Paternal Grandmother   . Prostate cancer Other   . Prostate cancer Brother        prostate    SOCIAL HX:  Former smoker quit 38  Occasional etoh  Married with 2 sons and 1 daughter  From [place] Retired    Current Outpatient Medications:  .  albuterol (PROAIR HFA) 108 (90 Base) MCG/ACT inhaler, Inhale 1-2 puffs into the lungs every 6 (six) hours as needed for wheezing or shortness of breath., Disp: , Rfl:  .  atorvastatin (LIPITOR) 20 MG tablet, Take 1 tablet (20 mg total) by mouth daily., Disp: 90 tablet, Rfl: 3 .  bimatoprost (LUMIGAN) 0.01 % SOLN, Place 1 drop into both eyes at bedtime. 1 drop affected eye, Disp: 7.5 mL, Rfl: 5 .  Cyanocobalamin (B-12) 500 MCG TABS, Take 500 mcg by mouth daily., Disp: 90 tablet, Rfl: 3 .  DYMISTA 137-50 MCG/ACT  SUSP, , Disp: , Rfl:  .  gabapentin (NEURONTIN) 800 MG tablet, Take 1 tablet (800 mg total) by mouth 3 (three) times daily., Disp: , Rfl:  .  losartan (COZAAR) 50 MG tablet, Take 1 tablet (50 mg total) by mouth daily., Disp: 90 tablet, Rfl: 3 .  methylPREDNISolone (MEDROL DOSEPAK) 4 MG TBPK tablet, See admin instructions. follow package directions, Disp: , Rfl:  .  oxyCODONE-acetaminophen (PERCOCET) 10-325 MG tablet, Take 1 tablet by mouth every 8 (eight) hours as needed for pain. Dr. Vira Agar [facility] [place], Disp: , Rfl:  .  Vitamin D, Ergocalciferol, (DRISDOL) 1.25 MG (50000 UT) CAPS capsule, , Disp: , Rfl:   EXAM:  VITALS per patient if applicable:  GENERAL: alert, oriented, appears well and in no acute distress  HEENT:  atraumatic, conjunttiva clear, no obvious abnormalities on inspection of external nose and ears  NECK: normal movements of the head and neck  LUNGS: on inspection no signs of respiratory distress, breathing rate appears normal, no obvious gross SOB, gasping or wheezing  CV: no obvious cyanosis  MS: moves all visible extremities without noticeable abnormality  PSYCH/NEURO: pleasant and cooperative, no obvious depression or anxiety, speech and thought processing grossly intact  ASSESSMENT AND PLAN:  Discussed the following assessment and plan:  Lumbar radiculopathy - Plan: MR Lumbar Spine Wo Contrast, MR HIP RIGHT WO CONTRAST  Hip pain, acute, right - Plan: MR Lumbar Spine Wo Contrast, MR HIP RIGHT WO CONTRAST  Right buttock pain - Plan: MR Lumbar Spine Wo Contrast, MR HIP RIGHT WO CONTRAST  Essential hypertension -rec monitor BP cont meds   Glaucoma, unspecified glaucoma type, unspecified laterality Cataract of both eyes, unspecified cataract type  -AE appt 11/2019   HM  Flu shot utd  Consider prevnar at our office, pna 23 in future 1 year later Tdap as well and shingrix sent Rx today   Colonoscopy 08/18/19 tubular adenoma f/u in 3 years   UndetectablePSA9/9/2020h/o prostate cancer with h/o removal and radiation no injections Former smoker quit in 25s  Occasional etoh   Given names of eye MD and dentists in the area  [place] PCP Dr. Genia Plants [place] [address] [place]  EGD 09/25/10 esophagitis duodenitis mildly enlarged major papilla path neg h pylori chronic peptic duodentitis c/w adenomatous change; GERD   EGD path 06/05/12 gastric antrum bx negative duodenum negative no H pylori  EGD 01/23/15 mild erythema in stomach   EGD 01/24/17-see report scanned in path 01/24/17 gastric antrum bx and duodenum bx negative no H pylori  Upper EUS 03/08/11 see report scanned in chart Upper EUS 01/23/15 see report scanned inchart Upper EUS  01/24/17 GB sludge, no gallstones, bile duct normal, hyperechoic foci and strands in pancreas and lobularity but no parenchymal calcifications/pancreatic ductal dilatation noted, 2 small cysts in pancreas body 4-5 mm liver with 6 mm cyst no lymphadenopathy  Colonoscopy 08/02/13 hyperplastic polyp sigmoid colon and mild hyperplastic changes rectum   Colonoscopy 07/27/10 semi pedunculated polyp splenic flexure IH/EH  Colonoscopy 07/31/08 pedunculated polyp transverse colon few diminutive sessile polyps rectum diverticulosis IH/EH   -we discussed possible serious and likely etiologies, options for evaluation and workup, limitations of telemedicine visit vs in person visit, treatment, treatment risks and precautions. Pt prefers to treat via telemedicine empirically rather then risking or undertaking an in person visit at this moment. Patient agrees to seek prompt in person care if worsening, new symptoms arise, or if  is not improving with treatment.   I discussed the assessment and treatment plan with the patient. The patient was provided an opportunity to ask questions and all were answered. The patient agreed with the plan and demonstrated an understanding of the instructions.   The patient was advised to call back or seek an in-person evaluation if the symptoms worsen or if the condition fails to improve as anticipated.  Time spent 20 to 29 minutes  Delorise Stanard, MD

## 2019-11-06 ENCOUNTER — Other Ambulatory Visit: Payer: Self-pay

## 2019-11-11 ENCOUNTER — Other Ambulatory Visit: Payer: Self-pay

## 2019-11-11 ENCOUNTER — Ambulatory Visit (INDEPENDENT_AMBULATORY_CARE_PROVIDER_SITE_OTHER): Payer: Medicare HMO | Admitting: Lab

## 2019-11-11 DIAGNOSIS — Z23 Encounter for immunization: Secondary | ICD-10-CM

## 2019-11-11 NOTE — Progress Notes (Addendum)
Pt in office today for Pneumococcal injection in Right arm. Pt tolerated well.  Pt had prevnar  [place]

## 2019-11-18 ENCOUNTER — Ambulatory Visit
Admission: RE | Admit: 2019-11-18 | Discharge: 2019-11-18 | Disposition: A | Discharge status: Home / Self Care | Payer: Medicare HMO | Source: Ambulatory Visit | Attending: Internal Medicine | Admitting: Internal Medicine

## 2019-11-18 ENCOUNTER — Other Ambulatory Visit: Payer: Self-pay

## 2019-11-18 DIAGNOSIS — M25551 Pain in right hip: Secondary | ICD-10-CM | POA: Diagnosis present

## 2019-11-18 DIAGNOSIS — M7918 Myalgia, other site: Secondary | ICD-10-CM | POA: Insufficient documentation

## 2019-11-18 DIAGNOSIS — M5416 Radiculopathy, lumbar region: Secondary | ICD-10-CM | POA: Diagnosis present

## 2019-12-28 ENCOUNTER — Telehealth: Payer: Self-pay | Admitting: Internal Medicine

## 2019-12-28 NOTE — Telephone Encounter (Signed)
Does patient want me to order hip Xrays  Insurance denied right hip MRI Or  Did his pain clinic address his hip pain?   If we do do hip Xrays and they are abnormal I will rec. Orthopedics   [employer]

## 2019-12-29 NOTE — Telephone Encounter (Signed)
Patient states his hip is no longer bothering him, states that he will call back if the pain worsens and he wants the x-rays.   Patient would like a recommendation to a Dentist in the area. Please advise

## 2019-12-29 NOTE — Telephone Encounter (Signed)
Left message to return call. Mailed this encounter notes with list to patient at address on file.

## 2019-12-29 NOTE — Telephone Encounter (Signed)
Ive listed this on his visit summary from 03/28/2019 so he should have a copy or be able to see in epic but also below if needed    Dentists  1. [facility] [place] 2. [place]  3. Dr. Johnnette Litter [place]   3. Dr. Lyanne [facility] in [place]

## 2020-01-31 ENCOUNTER — Ambulatory Visit: Payer: Medicare HMO | Attending: Internal Medicine

## 2020-01-31 DIAGNOSIS — Z23 Encounter for immunization: Secondary | ICD-10-CM

## 2020-01-31 NOTE — Progress Notes (Signed)
   Covid-19 Vaccination Clinic  Name:  Matthew Turner    MRN: [id number] DOB: [birth year]  01/31/2020  Matthew Turner was observed post Covid-19 immunization for 15 minutes without incident. He was provided with Vaccine Information Sheet and instruction to access the V-Safe system.   Matthew Turner was instructed to call 911 with any severe reactions post vaccine: Marland Kitchen Difficulty breathing  . Swelling of face and throat  . A fast heartbeat  . A bad rash all over body  . Dizziness and weakness   Immunizations Administered    Name Date Dose VIS Date Route   Pfizer COVID-19 Vaccine 01/31/2020  8:58 AM 0.3 mL 10/03/2019 Intramuscular   Manufacturer: [place]   Lot: [id number]   [place]: [id number]

## 2020-02-16 ENCOUNTER — Encounter: Payer: Self-pay | Admitting: Nurse Practitioner

## 2020-02-16 ENCOUNTER — Telehealth: Payer: Medicare HMO | Admitting: Nurse Practitioner

## 2020-02-16 ENCOUNTER — Other Ambulatory Visit: Payer: Self-pay

## 2020-02-16 VITALS — Ht 70.0 in | Wt 220.0 lb

## 2020-02-16 DIAGNOSIS — Z20822 Contact with and (suspected) exposure to covid-19: Secondary | ICD-10-CM

## 2020-02-16 DIAGNOSIS — R197 Diarrhea, unspecified: Secondary | ICD-10-CM

## 2020-02-16 DIAGNOSIS — R110 Nausea: Secondary | ICD-10-CM | POA: Diagnosis not present

## 2020-02-16 DIAGNOSIS — R509 Fever, unspecified: Secondary | ICD-10-CM

## 2020-02-16 DIAGNOSIS — R067 Sneezing: Secondary | ICD-10-CM

## 2020-02-16 MED ORDER — AZITHROMYCIN 250 MG PO TABS: ORAL_TABLET | ORAL | 0 refills | Status: DC

## 2020-02-16 NOTE — Patient Instructions (Addendum)
Please call to  get tested for Covid today. You had a serious exposure and have many of the Covid symptoms. You had your first Covid vaccine about 1 week before you attended a family event and were exposed to Covid virus.   You also mention HX of bronchitis starting out similar, so I will consider azithromycin and use your albuterol x1- 2  per day if you start to cough, drink plenty of fluids, eat protein- like scrambled eggs, rest and stay quarantined. Currently, you have no congestion, sore throat, cough or chest wheezing.  You are feeling better now than when this first started.   Watch yourself closely and go to the emergency room if develop worsening symptoms like high fevers, shortness of breath, or worsening weakness.  Instructed to rest and hydrate well.  Stay home, avoid being around other people if possible while waiting for your Covid results. Wear a mask if you have to leave the house for medical care. Good handwashing. Ask others to pick up groceries and your pharmacy items. Call us tomorrow for update. Try to get your pulse oximeter to work.

## 2020-02-16 NOTE — Progress Notes (Signed)
Virtual Visit via Video Note  I connected with@ on 02/16/20 at  2:00 PM EDT by a video enabled telemedicine application and verified that I am speaking with the correct person using two identifiers. This visit type was conducted due to national recommendations for restrictions regarding the COVID-19 Pandemic (e.g. social distancing).  This format is felt to be most appropriate for this patient at this time.   I discussed the limitations of evaluation and management by telemedicine and the availability of in person appointments. The patient expressed understanding and agreed to proceed.  Only the patient and myself were on today's video visit. The patient was at home and I was in my office at the time of today's visit.   History of Present Illness: Patient reports onset,  4 days ago on Thurs while working in the yard,  he felt a little weak.  He came in and started sneezing.  He thought his allergies were bothering him.  The next day on Friday, he rested did not work out in the yard, felt subjective fevers, had a few chills, scratchy throat, but had marked decreased energy and fatigue.  He also noted decreased appetite, and did not want to eat.  He had to push fluids,and  noted a vague sense of nausea.  By Saturday,  he had a couple episodes of diarrhea, and felt weak/fatigued  especially when climbing up stairs.  He has had a slight nagging headache, decreased energy, and a gassy, rolling stomach.  He denies any nasal congestion, had a scratchy throat initially but that resolved. He has intact smell and taste. No congestion, cough, wheezing, chest pain, pressure, heaviness, or tightness. He does feel a bit "foggy headed".  He does not feel as bad today as he did when this started.  He is not sure if this is a virus or if he is starting into bronchitis. He has a history of asthma and has albuterol to use. He has not needed it.    He did attend a funeral out of state on 4/172021 where multiple other family  members came down with Covid.  In fact,  he was riding in a car with a cousin who was hospitalized with Covid shortly after that visit.  The patient has completed his first Covid vaccine on 01/31/2020. The patient has never tested positive for Covid before.    Observations/Objective: Gen: Awake, alert, no acute distress, He is well appearing. He  was able to gt himself on the video using a different platform.  HEENT: No nasal congestion noted. Voice is clear.  Resp: Breathing appears  even and non-labored, no cough Psych: Calm/pleasant demeanor, laughing and talking MSK: Movement of upper extremities is within normal. Neuro: Alert and Oriented x 3, + facial symmetry, speech is clear.  Assessment and Plan: Your symptoms may be due to  COVID. Please get tested for COVID as well. Go online to [email or web address]  Or call [phone]  for an appointment to get a Covid test.   Encouraged to monitor for any worsening symptoms; watch for increased shortness of breath, weakness, and signs of dehydration. Advised when to seek emergency care.  Instructed to rest and hydrate well. Advised to leave the house during recommended isolation period, only if it is necessary to seek medical care.  You also mention HX of bronchitis starting out similar, so I will consider azithromycin and use your albuterol x1- 2  per day if you start to cough, drink plenty of fluids, eat protein- like  scrambled eggs, rest and stay quarantined.  Currently, you have no congestion, sore throat, cough or chest wheezing, or loss of taste or smell. You are feeling better now than when this first started.   Watch yourself closely and go to the emergency room if develop worsening symptoms like high fevers, shortness of breath, or worsening weakness.  Instructed to rest and hydrate well.   Instructed to rest and hydrate well.  Stay home, avoid being around other people if possible while waiting for your Covid results. Wear a mask if you  have to leave the house for medical care. Good handwashing. Ask others to pick up groceries and your pharmacy items. Call us tomorrow for update. Try to get your pulse oximeter to work.    Follow Up Instructions:   I discussed the assessment and treatment plan with the patient. The patient was provided an opportunity to ask questions and all were answered. The patient agreed with the plan and demonstrated an understanding of the instructions.   The patient was advised to call back or seek an in-person evaluation if the symptoms worsen or if the condition fails to improve as anticipated.    Denice Paradise, NP

## 2020-02-17 ENCOUNTER — Telehealth: Payer: Self-pay | Admitting: Nurse Practitioner

## 2020-02-17 ENCOUNTER — Telehealth: Payer: Self-pay

## 2020-02-17 NOTE — Telephone Encounter (Signed)
Called to check on patient today after his virtual visit. Patient states he is starting to get some chest congestion and some memory problems are starting to happen. Patient also states he was having some nasal drainage but that has not gotten better. He has not yet been able to get COVID tested.   PLAN: Advised to go to the Acute care for Covid testing today. He is agreeable and will proceed. He says he feels abel to drive himself.

## 2020-02-17 NOTE — Telephone Encounter (Signed)
Called to check on patient today after his virtual visit. Patient states he is starting to get some chest congestion and some memory problems are starting to happen. Patient also states he was having some nasal drainage but that has not gotten better. He has not yet been able to get COVID tested.

## 2020-02-18 ENCOUNTER — Telehealth: Payer: Self-pay | Admitting: Adult Health

## 2020-02-18 ENCOUNTER — Telehealth: Payer: Self-pay | Admitting: Nurse Practitioner

## 2020-02-18 NOTE — Telephone Encounter (Signed)
Called to discuss with Kirstie Peri about Covid symptoms and the use of bamlanivimab, a monoclonal antibody infusion for those with mild to moderate Covid symptoms and at a high risk of hospitalization.     Pt is qualified for this infusion at the [facility] infusion center due to co-morbid conditions and/or a member of an at-risk group, however declines infusion at this time. Symptoms tier reviewed as well as criteria for ending isolation.  Symptoms reviewed that would warrant ED/Hospital evaluation. Preventative practices reviewed. Patient verbalized understanding. Patient advised to call back if he decides that he does want to get infusion. Callback number to the infusion center given. Patient advised to go to Urgent care or ED with severe symptoms. Last date he  would be eligible for infusion is 02/22/20 .    Patient Active Problem List   Diagnosis Date Noted  . Hip pain, acute, right 10/29/2019  . Cataract 10/29/2019  . Lumbar radiculopathy 10/09/2019  . Encounter for screening colonoscopy   . History of prostate cancer 04/01/2019  . Hypertension   . HLD (hyperlipidemia)   . Glaucoma   . Chronic pain   . Asthma   . Spinal cord injury, C5-C7 ([place])     Barnard Sharps NP-C  [facility]   02/18/2020

## 2020-02-18 NOTE — Telephone Encounter (Signed)
Mr. Wynetta Emery came to our clinic yesterday looking for a Covid test site. He followed my car to the [facility]. He felt weak, but no respiratory symptoms or distress. He was seen in Acute Care and tested positive for Covid test. Temp 103. Lungs CTA with no wheezing. He is not taking Zpak- order never picked up  and I will discontinue it as he does not have PULM sx at this time. He has been instructed on supportive care of this virus.   I spoke to him today and he is hydrated, takes Percocet regularly and that broke his fever, drinking plenty of fluids, not hungry and DTR bringing him Ensure. I asked him to get a thermometer and Tylenol/Advil for fever prn. He says she is feeling better today and his family is checking on him daily. Advised to call back with any worsening Sx. He is very appreciative of the help.

## 2020-02-18 NOTE — Telephone Encounter (Signed)
Information given to [facility] infusion [facility] at 11:38 on 02/18/20

## 2020-02-18 NOTE — Telephone Encounter (Signed)
He tested positive for Covid at [facility]. Please ask [facility] Covid monitors to call him regarding eligibly for immunoglobulin therapy.

## 2020-02-19 ENCOUNTER — Telehealth: Payer: Self-pay | Admitting: Nurse Practitioner

## 2020-02-19 ENCOUNTER — Other Ambulatory Visit: Payer: Medicare HMO

## 2020-02-19 ENCOUNTER — Encounter: Payer: Self-pay | Admitting: Internal Medicine

## 2020-02-19 ENCOUNTER — Telehealth: Payer: Self-pay | Admitting: Internal Medicine

## 2020-02-19 NOTE — Telephone Encounter (Signed)
I tried a couple of times to reach [name], no answer, and voicemail is full. He can call [phone] if he would like [place] to call him or if has another number I am glad to call that . Thanks so much for the referral . Tammy  Please call his wife Levada Dy and give her this phone number to contact Rexene Edison, NP.   Also, how is he feeling?

## 2020-02-19 NOTE — Telephone Encounter (Signed)
Covid 19 +02/17/20  How is he feeling ?  Wife was in office yesterday and given instructions for him if + They rec monoclonal abx treatment and I do as well  Can we call Esmond Plants for this Juliann Pulse knows the #  Sch f/u if needed   [place]

## 2020-02-19 NOTE — Telephone Encounter (Signed)
I called Mr. Sarker to get an update on how he is feeling  and could not leave a message as his mailbox is full. I called his wife,Angela and told her we were trying to reach her husband to offer the immunoglobulin infusion therapy. She thinks he did not understand and will reach out to him -safely- outside of his house.  What phone number does Mr. Glenny use call to get to Clemetine Marker, NP if he decides to get the infusion?

## 2020-02-19 NOTE — Telephone Encounter (Signed)
[name] gave  me permission to call his wife Matthew Turner  and he gave me her phone number yesterday. He wants me to talk to her about his plan of care.  Matthew Turner [phone]. I have verbal permission to talk to his wife about his medical care and recommendations.

## 2020-02-19 NOTE — Telephone Encounter (Signed)
Call pt again unable to be reached 02/19/20  See  How feeling and rec monoclonal antibody infusion needs w/in 10 days   TMS

## 2020-02-19 NOTE — Telephone Encounter (Signed)
See secure chat I was able to reach out to [place] NP and she said that she would try again to reach out to Mr. Philipp Ovens.

## 2020-02-19 NOTE — Telephone Encounter (Signed)
Sending as per request

## 2020-02-20 NOTE — Telephone Encounter (Signed)
I spoke with the patient this morning and advised patient to call Rexene Edison, NP again and speak with her. Patient stated its been hard for him to get rest between bathroom visits and our office calling so many times a day. Patient stated he would call Tammy and I was going to give him her number again before he hung up on me but was unsuccessful. I tried to call patient back three times in case it was an accident he hung up but he turned his phone off; calls were going straight to voicemail. I spoke with Maudie Mercury about this as well and she wanted to me let you know what was going on and that we all have tried multiple times to speak with patient but he is being uncooperative at this point.

## 2020-02-20 NOTE — Telephone Encounter (Signed)
Call pt and rec monoclonal antibody treatment  This is important which is why we are calling so much  Call wife if needed

## 2020-02-20 NOTE — Telephone Encounter (Signed)
Patient does not wan t any more calls he staed he is cutting his phone off so we cannot harass him.

## 2020-02-20 NOTE — Telephone Encounter (Signed)
I was able to speak with patient wife, she confirmed sh e has spoken with patient and he is willing to receive the [facility] infusion he just does not have transportation , that he feels to bad to drive that far and will not let anyone else drive for risk of exposure. Patient gave verbal to NP for permission to speak with his wife. I ask that  Patient give [place] a call since he is to weak to drive and feels to bad to drive we can get him transportation or does he need to be admitted.

## 2020-02-20 NOTE — Telephone Encounter (Signed)
Noted. Will no longer attempt to contact patient about COVID-19 treatment.

## 2020-02-23 ENCOUNTER — Telehealth: Payer: Self-pay | Admitting: Nurse Practitioner

## 2020-02-23 ENCOUNTER — Inpatient Hospital Stay
Admission: EM | Admit: 2020-02-23 | Discharge: 2020-02-27 | DRG: 177 | Disposition: A | Discharge status: Home / Self Care | Payer: Medicare HMO | Attending: Internal Medicine | Admitting: Internal Medicine

## 2020-02-23 ENCOUNTER — Other Ambulatory Visit: Payer: Self-pay

## 2020-02-23 ENCOUNTER — Emergency Department: Payer: Medicare HMO

## 2020-02-23 DIAGNOSIS — H409 Unspecified glaucoma: Secondary | ICD-10-CM | POA: Diagnosis present

## 2020-02-23 DIAGNOSIS — Z87891 Personal history of nicotine dependence: Secondary | ICD-10-CM

## 2020-02-23 DIAGNOSIS — J4520 Mild intermittent asthma, uncomplicated: Secondary | ICD-10-CM | POA: Diagnosis present

## 2020-02-23 DIAGNOSIS — U071 COVID-19: Principal | ICD-10-CM

## 2020-02-23 DIAGNOSIS — I10 Essential (primary) hypertension: Secondary | ICD-10-CM | POA: Diagnosis present

## 2020-02-23 DIAGNOSIS — E785 Hyperlipidemia, unspecified: Secondary | ICD-10-CM | POA: Diagnosis present

## 2020-02-23 DIAGNOSIS — G8929 Other chronic pain: Secondary | ICD-10-CM | POA: Diagnosis present

## 2020-02-23 DIAGNOSIS — D473 Essential (hemorrhagic) thrombocythemia: Secondary | ICD-10-CM | POA: Diagnosis not present

## 2020-02-23 DIAGNOSIS — R7401 Elevation of levels of liver transaminase levels: Secondary | ICD-10-CM | POA: Diagnosis not present

## 2020-02-23 DIAGNOSIS — J1282 Pneumonia due to coronavirus disease 2019: Secondary | ICD-10-CM | POA: Diagnosis present

## 2020-02-23 DIAGNOSIS — Z8249 Family history of ischemic heart disease and other diseases of the circulatory system: Secondary | ICD-10-CM | POA: Diagnosis not present

## 2020-02-23 DIAGNOSIS — Z8042 Family history of malignant neoplasm of prostate: Secondary | ICD-10-CM

## 2020-02-23 DIAGNOSIS — Z885 Allergy status to narcotic agent status: Secondary | ICD-10-CM | POA: Diagnosis not present

## 2020-02-23 DIAGNOSIS — Z8546 Personal history of malignant neoplasm of prostate: Secondary | ICD-10-CM | POA: Diagnosis not present

## 2020-02-23 DIAGNOSIS — D649 Anemia, unspecified: Secondary | ICD-10-CM | POA: Diagnosis present

## 2020-02-23 DIAGNOSIS — G8921 Chronic pain due to trauma: Secondary | ICD-10-CM | POA: Diagnosis not present

## 2020-02-23 DIAGNOSIS — J9601 Acute respiratory failure with hypoxia: Secondary | ICD-10-CM | POA: Diagnosis present

## 2020-02-23 DIAGNOSIS — Z79899 Other long term (current) drug therapy: Secondary | ICD-10-CM | POA: Diagnosis not present

## 2020-02-23 DIAGNOSIS — R7989 Other specified abnormal findings of blood chemistry: Secondary | ICD-10-CM | POA: Diagnosis present

## 2020-02-23 DIAGNOSIS — J45909 Unspecified asthma, uncomplicated: Secondary | ICD-10-CM | POA: Diagnosis present

## 2020-02-23 LAB — CBC WITH DIFFERENTIAL/PLATELET
Abs Immature Granulocytes: 0.19 10*3/uL — ABNORMAL HIGH (ref 0.00–0.07)
Basophils Absolute: 0 10*3/uL (ref 0.0–0.1)
Basophils Relative: 0 %
Eosinophils Absolute: 0.1 10*3/uL (ref 0.0–0.5)
Eosinophils Relative: 1 %
HCT: 33.2 % — ABNORMAL LOW (ref 39.0–52.0)
Hemoglobin: 11.2 g/dL — ABNORMAL LOW (ref 13.0–17.0)
Immature Granulocytes: 2 %
Lymphocytes Relative: 10 %
Lymphs Abs: 0.8 10*3/uL (ref 0.7–4.0)
MCH: 29.7 pg (ref 26.0–34.0)
MCHC: 33.7 g/dL (ref 30.0–36.0)
MCV: 88.1 fL (ref 80.0–100.0)
Monocytes Absolute: 0.7 10*3/uL (ref 0.1–1.0)
Monocytes Relative: 8 %
Neutro Abs: 6.8 10*3/uL (ref 1.7–7.7)
Neutrophils Relative %: 79 %
Platelets: 380 10*3/uL (ref 150–400)
RBC: 3.77 MIL/uL — ABNORMAL LOW (ref 4.22–5.81)
RDW: 12.2 % (ref 11.5–15.5)
Smear Review: NORMAL
WBC: 8.6 10*3/uL (ref 4.0–10.5)
nRBC: 0 % (ref 0.0–0.2)

## 2020-02-23 LAB — COMPREHENSIVE METABOLIC PANEL
ALT: 115 U/L — ABNORMAL HIGH (ref 0–44)
AST: 70 U/L — ABNORMAL HIGH (ref 15–41)
Albumin: 3.3 g/dL — ABNORMAL LOW (ref 3.5–5.0)
Alkaline Phosphatase: 60 U/L (ref 38–126)
Anion gap: 14 (ref 5–15)
BUN: 15 mg/dL (ref 6–20)
CO2: 27 mmol/L (ref 22–32)
Calcium: 8.6 mg/dL — ABNORMAL LOW (ref 8.9–10.3)
Chloride: 96 mmol/L — ABNORMAL LOW (ref 98–111)
Creatinine, Ser: 1.15 mg/dL (ref 0.61–1.24)
GFR calc Af Amer: >60 mL/min (ref >60)
GFR calc non Af Amer: >60 mL/min (ref >60)
Glucose, Bld: 126 mg/dL — ABNORMAL HIGH (ref 70–99)
Potassium: 3.3 mmol/L — ABNORMAL LOW (ref 3.5–5.1)
Sodium: 137 mmol/L (ref 135–145)
Total Bilirubin: 1.4 mg/dL — ABNORMAL HIGH (ref 0.3–1.2)
Total Protein: 7.1 g/dL (ref 6.5–8.1)

## 2020-02-23 LAB — FIBRIN DERIVATIVES D-DIMER (ARMC ONLY): Fibrin derivatives D-dimer (ARMC): 1144.86 ng/mL (FEU) — ABNORMAL HIGH (ref 0.00–499.00)

## 2020-02-23 LAB — FERRITIN: Ferritin: 552 ng/mL — ABNORMAL HIGH (ref 24–336)

## 2020-02-23 LAB — C-REACTIVE PROTEIN: CRP: 12.3 mg/dL — ABNORMAL HIGH (ref <1.0)

## 2020-02-23 LAB — LACTATE DEHYDROGENASE: LDH: 306 U/L — ABNORMAL HIGH (ref 98–192)

## 2020-02-23 LAB — ABO/RH: ABO/RH(D): O POS

## 2020-02-23 MED ORDER — OXYCODONE-ACETAMINOPHEN 10-325 MG PO TABS: 1.0000 | ORAL_TABLET | Freq: Three times a day (TID) | ORAL | Status: DC | PRN

## 2020-02-23 MED ORDER — DEXAMETHASONE 4 MG PO TABS
6.0000 mg | ORAL_TABLET | ORAL | Status: DC
  Administered 2020-02-24 – 2020-02-26 (×4): 6 mg via ORAL
  Filled 2020-02-23 (×4): qty 2

## 2020-02-23 MED ORDER — ACETAMINOPHEN 325 MG PO TABS: 650.0000 mg | ORAL_TABLET | Freq: Four times a day (QID) | ORAL | Status: DC | PRN

## 2020-02-23 MED ORDER — POTASSIUM CHLORIDE CRYS ER 20 MEQ PO TBCR
40.0000 meq | EXTENDED_RELEASE_TABLET | Freq: Once | ORAL | Status: AC
  Administered 2020-02-23: 40 meq via ORAL
  Filled 2020-02-23: qty 2

## 2020-02-23 MED ORDER — ENSURE MAX PROTEIN PO LIQD
11.0000 [oz_av] | Freq: Every day | ORAL | Status: DC
  Administered 2020-02-25 – 2020-02-27 (×3): 11 [oz_av] via ORAL
  Filled 2020-02-23: qty 330

## 2020-02-23 MED ORDER — DEXAMETHASONE SODIUM PHOSPHATE 10 MG/ML IJ SOLN
8.0000 mg | Freq: Once | INTRAMUSCULAR | Status: AC
  Administered 2020-02-23: 8 mg via INTRAVENOUS
  Filled 2020-02-23: qty 1

## 2020-02-23 MED ORDER — SODIUM CHLORIDE 0.9 % IV SOLN
200.0000 mg | Freq: Once | INTRAVENOUS | Status: AC
  Administered 2020-02-23: 200 mg via INTRAVENOUS
  Filled 2020-02-23: qty 40

## 2020-02-23 MED ORDER — CYANOCOBALAMIN 500 MCG PO TABS
500.0000 ug | ORAL_TABLET | Freq: Every day | ORAL | Status: DC
  Administered 2020-02-24 – 2020-02-27 (×4): 500 ug via ORAL
  Filled 2020-02-23 (×4): qty 1

## 2020-02-23 MED ORDER — GABAPENTIN 400 MG PO CAPS
800.0000 mg | ORAL_CAPSULE | Freq: Three times a day (TID) | ORAL | Status: DC
  Administered 2020-02-24 – 2020-02-27 (×10): 800 mg via ORAL
  Filled 2020-02-23 (×2): qty 2
  Filled 2020-02-23: qty 8
  Filled 2020-02-23 (×8): qty 2
  Filled 2020-02-23: qty 8
  Filled 2020-02-23: qty 2
  Filled 2020-02-23 (×2): qty 8
  Filled 2020-02-23 (×2): qty 2

## 2020-02-23 MED ORDER — FLUTICASONE PROPIONATE 50 MCG/ACT NA SUSP
1.0000 | Freq: Every day | NASAL | Status: DC
  Administered 2020-02-24 – 2020-02-27 (×4): 1 via NASAL
  Filled 2020-02-23: qty 16

## 2020-02-23 MED ORDER — DOCUSATE SODIUM 100 MG PO CAPS
100.0000 mg | ORAL_CAPSULE | Freq: Two times a day (BID) | ORAL | Status: DC
  Administered 2020-02-24 – 2020-02-25 (×3): 100 mg via ORAL
  Filled 2020-02-23 (×4): qty 1

## 2020-02-23 MED ORDER — SODIUM CHLORIDE 0.9 % IV BOLUS
500.0000 mL | Freq: Once | INTRAVENOUS | Status: AC
  Administered 2020-02-23: 500 mL via INTRAVENOUS

## 2020-02-23 MED ORDER — IBUPROFEN 600 MG PO TABS
600.0000 mg | ORAL_TABLET | Freq: Once | ORAL | Status: AC
  Administered 2020-02-23: 600 mg via ORAL
  Filled 2020-02-23: qty 1

## 2020-02-23 MED ORDER — ENOXAPARIN SODIUM 40 MG/0.4ML ~~LOC~~ SOLN
40.0000 mg | SUBCUTANEOUS | Status: DC
  Administered 2020-02-24 – 2020-02-27 (×4): 40 mg via SUBCUTANEOUS
  Filled 2020-02-23 (×4): qty 0.4

## 2020-02-23 MED ORDER — ALBUTEROL SULFATE HFA 108 (90 BASE) MCG/ACT IN AERS
1.0000 | INHALATION_SPRAY | Freq: Four times a day (QID) | RESPIRATORY_TRACT | Status: DC | PRN
  Filled 2020-02-23: qty 6.7

## 2020-02-23 MED ORDER — LOSARTAN POTASSIUM 50 MG PO TABS
50.0000 mg | ORAL_TABLET | Freq: Every day | ORAL | Status: DC
  Administered 2020-02-24 – 2020-02-27 (×4): 50 mg via ORAL
  Filled 2020-02-23 (×4): qty 1

## 2020-02-23 MED ORDER — ATORVASTATIN CALCIUM 20 MG PO TABS
20.0000 mg | ORAL_TABLET | Freq: Every day | ORAL | Status: DC
  Administered 2020-02-24 – 2020-02-27 (×4): 20 mg via ORAL
  Filled 2020-02-23 (×4): qty 1

## 2020-02-23 MED ORDER — SODIUM CHLORIDE 0.9 % IV SOLN
100.0000 mg | Freq: Every day | INTRAVENOUS | Status: AC
  Administered 2020-02-24 – 2020-02-27 (×4): 100 mg via INTRAVENOUS
  Filled 2020-02-23: qty 20
  Filled 2020-02-23: qty 100
  Filled 2020-02-23 (×2): qty 20

## 2020-02-23 NOTE — ED Notes (Signed)
Admitting MD at bedside.

## 2020-02-23 NOTE — H&P (Signed)
History and Physical    Matthew Turner [id number] DOB: [birth year] DOA: 02/23/2020  PCP: McLean-Scocuzza, Nino Glow, MD (Confirm with patient/family/NH records and if not entered, this has to be entered at [facility] point of entry) Patient coming from: home  I have personally briefly reviewed patient's old medical records in [place]  Chief Complaint: SOB, weakness, fever in covid 19 Positive patient  HPI: Matthew Turner is a 59 y.o. male with medical history significant of asthma, HTN, HLD recently diagnosed with COVID-19 (02/17/2020) who presents to the emergency department for worsening COVID symptoms, particularly over the last several days.  Patient states over the last several days he has had increased shortness of breath, feels winded with long sentences.  Also reports increased episodes of confusion that seem to occur with onset/increase in his fever.  States he feels like his mind goes blank when he is trying to talk.  He is aware that this is happening.  No other associated neurological symptoms during these periods.  No seizure-like activity or history of seizures.  Also experiencing intermittent discomfort in his abdomen, describes them as gas-like pains.  Associated with nausea, no vomiting.  Has been experiencing diarrhea throughout his course.  Denies chest pain.  Reports decreased by mouth due to nausea.  Patient discussed his symptoms with his primary care doctor via the phone, who recommended he activate EMS.  On EMS arrival, patient was 87% on room air, placed on 2 L nasal cannula with improvement to the mid to high 90s.  Does not normally wear supplemental oxygen.  in the HPI section of the daily progress note).  ED Course: hemodynamically stable. Tachypnic. Labs revealed elevated LDH, ferritin, CRP pending. CXR with bilateral infiltrates c/w viral pneumonia. TRH consulted to admit for continued treatment including oxygen support, remdesivir and steroids.  Review of Systems:  As per HPI otherwise 10 point review of systems negative.    Past Medical History:  Diagnosis Date  . Asthma    dust  . Cancer ([place])    prostate; early 34s   . Chronic pain    f/u pain clinic in [place]; arms, neck, back-pain clinic in [place]  . [place]    02/17/20  . Glaucoma   . HLD (hyperlipidemia)   . Hypertension   . Spinal cord injury, C5-C7 ([place])    C6.C7 in 2008 injury at work he was paralyzed from waist down but walking again;workers comp    Past Surgical History:  Procedure Laterality Date  . COLONOSCOPY WITH PROPOFOL N/A 08/18/2019   Procedure: COLONOSCOPY WITH PROPOFOL;  Surgeon: Lin Landsman, MD;  Location: [facility] ENDOSCOPY;  Service: Gastroenterology;  Laterality: N/A;  . LUMBAR LAMINECTOMY    . PROSTATECTOMY     had radiation no injections h/o prostate pump    Soc Hx - married 42 years. He has 2 sons, 1 daughter, 6 grandchildren. 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 He lives with his wife but she spends much time away caring for her ill brother.    reports that he has quit smoking. He has never used smokeless tobacco. He reports current alcohol use. No history on file for drug.  Allergies  Allergen Reactions  . Percocet [Oxycodone-Acetaminophen]     Certain formulations itching      Family History  Problem Relation Age of Onset  . Heart failure  Mother   . Cancer Father        prostate  . Cancer Brother        prostate  . Pancreatic cancer Paternal Grandmother   . Prostate cancer Other   . Prostate cancer Brother        prostate     Prior to Admission medications   Medication Sig Start Date End Date Taking? Authorizing Provider  Ascorbic Acid (VITAMIN C) 1000 MG tablet Take 1,000 mg by mouth daily.  12/23/19  Yes [provider]  atorvastatin (LIPITOR) 20 MG tablet Take 1 tablet (20 mg total)  by mouth daily. 10/10/19  Yes McLean-Scocuzza, Nino Glow, MD  B Complex Vitamins (VITAMIN B-COMPLEX) TABS Take 1 tablet by mouth daily in the afternoon.  12/23/19  Yes [provider]  bimatoprost (LUMIGAN) 0.01 % SOLN Place 1 drop into both eyes at bedtime. 1 drop affected eye 07/09/19  Yes McLean-Scocuzza, Nino Glow, MD  Cyanocobalamin (B-12) 500 MCG TABS Take 500 mcg by mouth daily. 03/28/19  Yes McLean-Scocuzza, Nino Glow, MD  DYMISTA 137-50 MCG/ACT SUSP Place 1 spray into both nostrils 2 (two) times daily.  11/06/18  Yes [provider]  gabapentin (NEURONTIN) 800 MG tablet Take 1 tablet (800 mg total) by mouth 3 (three) times daily. 03/28/19  Yes McLean-Scocuzza, Nino Glow, MD  losartan (COZAAR) 50 MG tablet Take 1 tablet (50 mg total) by mouth daily. 03/28/19  Yes McLean-Scocuzza, Nino Glow, MD  valsartan-hydrochlorothiazide (DIOVAN-HCT) 160-12.5 MG tablet Take 1 tablet by mouth daily. 12/16/19  Yes [provider]  Vitamin D, Ergocalciferol, 50 MCG (2000 UT) CAPS Take 50,000 Units by mouth 3 (three) times a week.  11/06/18  Yes [provider]  albuterol (PROAIR HFA) 108 (90 Base) MCG/ACT inhaler Inhale 1-2 puffs into the lungs every 6 (six) hours as needed for wheezing or shortness of breath. 03/28/19   McLean-Scocuzza, Nino Glow, MD  oxyCODONE-acetaminophen (PERCOCET) 10-325 MG tablet Take 1 tablet by mouth every 8 (eight) hours as needed for pain. Dr. Vira [facility] [employer] in [facility]    [provider]    Physical Exam: Vitals:   02/23/20 1630 02/23/20 1700 02/23/20 1730 02/23/20 1800  BP: 113/77 117/74 118/74 112/72  Pulse: 96  87 88  Resp: (!) 23 (!) 25 (!) 21 (!) 23  Temp:      TempSrc:      SpO2: 97%  100% 99%  Weight:      Height:        Constitutional: NAD, calm, comfortable Vitals:   02/23/20 1630 02/23/20 1700 02/23/20 1730 02/23/20 1800  BP: 113/77 117/74 118/74 112/72  Pulse: 96  87 88  Resp: (!) 23 (!) 25 (!) 21 (!) 23  Temp:        TempSrc:      SpO2: 97%  100% 99%  Weight:      Height:       General:  WNWD man in no acute distress. Eyes: PERRL, lids and conjunctivae normal ENMT: Mucous membranes are moist. .Normal dentition.  Neck: normal, supple, no masses, no thyromegaly Respiratory: Shallow inspiration without use of accessory musculature. Diffuse feint crackles throughout both lung fields. No wheezing. Cardiovascular: Regular rate and rhythm, no murmurs / rubs / gallops. No extremity edema. 2+ pedal pulses. No carotid bruits.  Abdomen: obese, no tenderness, no masses palpated. No hepatosplenomegaly. Bowel sounds positive.  Musculoskeletal: no clubbing / cyanosis. No joint deformity upper and lower extremities. Good ROM, no  contractures. Normal muscle tone.  Skin: no rashes, lesions, ulcers. No induration Neurologic: CN 2-12 grossly intact.   Psychiatric: Normal judgment and insight. Alert and oriented x 3. Normal mood.     Labs on Admission: I have personally reviewed following labs and imaging studies  CBC: Recent Labs  Lab 02/23/20 1630  WBC 8.6  NEUTROABS 6.8  HGB 11.2*  HCT 33.2*  MCV 88.1  PLT 123XX123   Basic Metabolic Panel: Recent Labs  Lab 02/23/20 1630  NA 137  K 3.3*  CL 96*  CO2 27  GLUCOSE 126*  BUN 15  CREATININE 1.15  CALCIUM 8.6*   GFR: Estimated Creatinine Clearance: 81.3 mL/min (by C-G formula based on SCr of 1.15 mg/dL). Liver Function Tests: Recent Labs  Lab 02/23/20 1630  AST 70*  ALT 115*  ALKPHOS 60  BILITOT 1.4*  PROT 7.1  ALBUMIN 3.3*   No results for input(s): LIPASE, AMYLASE in the last 168 hours. No results for input(s): AMMONIA in the last 168 hours. Coagulation Profile: No results for input(s): INR, PROTIME in the last 168 hours. Cardiac Enzymes: No results for input(s): CKTOTAL, CKMB, CKMBINDEX, TROPONINI in the last 168 hours. BNP (last 3 results) No results for input(s): PROBNP in the last 8760 hours. HbA1C: No results for input(s): HGBA1C in  the last 72 hours. CBG: No results for input(s): GLUCAP in the last 168 hours. Lipid Profile: No results for input(s): CHOL, HDL, LDLCALC, TRIG, CHOLHDL, LDLDIRECT in the last 72 hours. Thyroid Function Tests: No results for input(s): TSH, T4TOTAL, FREET4, T3FREE, THYROIDAB in the last 72 hours. Anemia Panel: Recent Labs    02/23/20 1630  FERRITIN 552*   Urine analysis:    Component Value Date/Time   COLORURINE YELLOW 07/02/2019 0801   APPEARANCEUR TURBID (A) 07/02/2019 0801   LABSPEC 1.027 07/02/2019 0801   PHURINE < OR = 5.0 07/02/2019 0801   GLUCOSEU NEGATIVE 07/02/2019 0801   HGBUR NEGATIVE 07/02/2019 0801   KETONESUR NEGATIVE 07/02/2019 0801   PROTEINUR TRACE (A) 07/02/2019 0801   NITRITE NEGATIVE 07/02/2019 0801   LEUKOCYTESUR NEGATIVE 07/02/2019 0801    Radiological Exams on Admission: DG Chest Portable 1 View  Result Date: 02/23/2020 CLINICAL DATA:  Shortness of breath, COVID positive EXAM: PORTABLE CHEST 1 VIEW COMPARISON:  None. FINDINGS: The heart size and mediastinal contours are within normal limits. Subtle bilateral heterogeneous airspace opacity. The visualized skeletal structures are unremarkable. IMPRESSION: Subtle bilateral heterogeneous airspace opacity, in keeping with COVID airspace disease. Electronically Signed   By: Eddie Candle M.D.   On: 02/23/2020 16:52    EKG: Independently reviewed. NSR w/o acute changes  Assessment/Plan Active Problems:   Pneumonia due to COVID-19 virus   Hypertension   HLD (hyperlipidemia)   Chronic pain   Asthma    1. Covid 19 pneumonia - positive test at [facility] clinic 02/17/20 (in care everywhere) weith the subsequent development of progressive symptoms of fever, SOB and GI disturbance. He was hypoxemic to 87% on RA when EMS came to his home. Inflammatory markers were elevated. Plan med-surg admit  Remdesivir daily -had first dose in ED  Steroids - had first dose in ED and will receive decadron 6 mg daily  Oxygen at  2L  Titrated to keep O2 sat >9-%  If he stabilizes may be able to complete remdesivir as outpatient  2. HTN- he was on duplicate ARB medication - will continue losartan and hold Diovan/Hct  3. HLD- continue home meds  4. Asthma - intermittent .  Plan continue home meds - will add aerochamber  5. Chronic pain - 2/2 on the job injury - long term problem. Will continue brand Percocet.      DVT prophylaxis: Lovenox  Code Status: full code  Family Communication: Tried to call spouse - nonworking number in chart. Patient says he has spoken with her.  Disposition Plan: home 72-96 hours Consults called: none (with names) Admission status: inpatient    Adella Hare MD [facility] Pager [phone]  If 7PM-7AM, please contact night-coverage [email or web address] Password [facility]  02/23/2020, 6:36 PM

## 2020-02-23 NOTE — ED Notes (Signed)
Called dietary for ensure as it is not stocked in ED.

## 2020-02-23 NOTE — ED Notes (Signed)
Report to Steve, RN

## 2020-02-23 NOTE — Progress Notes (Signed)
Remdesivir - Pharmacy Brief Note   O:  ALT: pending SpO2: 96% on 2L   A/P:  Remdesivir 200 mg IVPB once followed by 100 mg IVPB daily x 4 days.    Rayna Sexton, PharmD, BCPS Clinical Pharmacist 02/23/2020 5:11 PM

## 2020-02-23 NOTE — ED Notes (Addendum)
Pt wants to wait to take potassium and other medications after eating ensure due to fear of becoming nauseated. Pt states that he has had poor PO intake at home other than ensure and feels sick to his stomach easily when taking medications and/or drinking water.

## 2020-02-23 NOTE — ED Provider Notes (Addendum)
[facility] Emergency Department Provider Note  ____________________________________________   First MD Initiated Contact with Patient 02/23/20 [phone]     (approximate)  I have reviewed the triage vital signs and the nursing notes.  History  Chief Complaint Hypoxia    HPI Matthew Turner is a 59 y.o. male recently diagnosed with COVID-19 (02/17/2020) who presents to the emergency department for worsening COVID symptoms, particularly over the last several days.  Patient states over the last several days he has had increased shortness of breath, feels winded with long sentences.  Also reports increased episodes of confusion that seem to occur with onset/increase in his fever.  States he feels like his mind goes blank when he is trying to talk.  He is aware that this is happening.  No other associated neurological symptoms during these periods.  No seizure-like activity or history of seizures.  Also experiencing intermittent discomfort in his abdomen, describes them as gas-like pains.  Associated with nausea, no vomiting.  Has been experiencing diarrhea throughout his course.  Denies chest pain.  Reports decreased by mouth due to nausea.  Patient discussed his symptoms with his primary care doctor via the phone, who recommended he activate EMS.  On EMS arrival, patient was 87% on room air, placed on 2 L nasal cannula with improvement to the mid to high 90s.  Does not normally wear supplemental oxygen.   Past Medical Hx Past Medical History:  Diagnosis Date  . Asthma    dust  . Cancer ([place])    prostate; early 65s   . Chronic pain    f/u pain clinic in [place]; arms, neck, back-pain clinic in [place]  . [place]    02/17/20  . Glaucoma   . HLD (hyperlipidemia)   . Hypertension   . Spinal cord injury, C5-C7 ([place])    C6.C7 in 2008 injury at work he was paralyzed from waist down but walking again;workers comp    Problem List Patient Active Problem List   Diagnosis Date  Noted  . Hip pain, acute, right 10/29/2019  . Cataract 10/29/2019  . Lumbar radiculopathy 10/09/2019  . Encounter for screening colonoscopy   . History of prostate cancer 04/01/2019  . Hypertension   . HLD (hyperlipidemia)   . Glaucoma   . Chronic pain   . Asthma   . Spinal cord injury, C5-C7 ([place])     Past Surgical Hx Past Surgical History:  Procedure Laterality Date  . COLONOSCOPY WITH PROPOFOL N/A 08/18/2019   Procedure: COLONOSCOPY WITH PROPOFOL;  Surgeon: Lin Landsman, MD;  Location: [facility] ENDOSCOPY;  Service: Gastroenterology;  Laterality: N/A;  . LUMBAR LAMINECTOMY    . PROSTATECTOMY     had radiation no injections h/o prostate pump     Medications Prior to Admission medications   Medication Sig Start Date End Date Taking? Authorizing Provider  albuterol (PROAIR HFA) 108 (90 Base) MCG/ACT inhaler Inhale 1-2 puffs into the lungs every 6 (six) hours as needed for wheezing or shortness of breath. 03/28/19   McLean-Scocuzza, Nino Glow, MD  Ascorbic Acid (VITAMIN C) 1000 MG tablet  12/23/19   [provider]  atorvastatin (LIPITOR) 20 MG tablet Take 1 tablet (20 mg total) by mouth daily. 10/10/19   McLean-Scocuzza, Nino Glow, MD  B Complex Vitamins (VITAMIN B-COMPLEX) TABS  12/23/19   [provider]  bimatoprost (LUMIGAN) 0.01 % SOLN Place 1 drop into both eyes at bedtime. 1 drop affected eye 07/09/19   McLean-Scocuzza, Nino Glow,  MD  Cyanocobalamin (B-12) 500 MCG TABS Take 500 mcg by mouth daily. 03/28/19   McLean-Scocuzza, Nino Glow, MD  DYMISTA 137-50 MCG/ACT SUSP  11/06/18   [provider]  gabapentin (NEURONTIN) 800 MG tablet Take 1 tablet (800 mg total) by mouth 3 (three) times daily. 03/28/19   McLean-Scocuzza, Nino Glow, MD  losartan (COZAAR) 50 MG tablet Take 1 tablet (50 mg total) by mouth daily. 03/28/19   McLean-Scocuzza, Nino Glow, MD  oxyCODONE-acetaminophen (PERCOCET) 10-325 MG tablet Take 1 tablet by mouth every 8 (eight) hours as needed for pain. Dr.  Vira [facility] [employer] in [facility]    [provider]  valsartan-hydrochlorothiazide (DIOVAN-HCT) 160-12.5 MG tablet Take 1 tablet by mouth daily. 12/16/19   [provider]  Vitamin D, Ergocalciferol, (DRISDOL) 1.25 MG (50000 UT) CAPS capsule  11/06/18   [provider]    Allergies Percocet [oxycodone-acetaminophen]  Family Hx Family History  Problem Relation Age of Onset  . Heart failure Mother   . Cancer Father        prostate  . Cancer Brother        prostate  . Pancreatic cancer Paternal Grandmother   . Prostate cancer Other   . Prostate cancer Brother        prostate    Social Hx Social History   Tobacco Use  . Smoking status: Former Research scientist (life sciences)  . Smokeless tobacco: Never Used  Substance Use Topics  . Alcohol use: Yes    Comment: Occasionally   . Drug use: Not on file     Review of Systems  Constitutional: Positive for fever. Negative for chills. Eyes: Negative for visual changes. ENT: Negative for sore throat. Cardiovascular: Negative for chest pain. Respiratory: Positive for shortness of breath. Gastrointestinal: Negative for nausea. Negative for vomiting.  Positive for gas pains. Genitourinary: Negative for dysuria. Musculoskeletal: Negative for leg swelling. Skin: Negative for rash. Neurological: Negative for headaches.  Positive for intermittent confusion.   Physical Exam  Vital Signs: ED Triage Vitals  Enc Vitals Group     BP 02/23/20 1628 130/70     Pulse Rate 02/23/20 1628 91     Resp 02/23/20 1628 (!) 27     Temp 02/23/20 1628 100.1 F (37.8 C)     Temp Source 02/23/20 1628 Oral     SpO2 02/23/20 1628 96 %     Weight 02/23/20 1629 222 lb (100.7 kg)     Height 02/23/20 1629 5\' 10"  (1.778 m)     Head Circumference --      Peak Flow --      Pain Score 02/23/20 1629 0     Pain Loc --      Pain Edu? --      Excl. in [place]? --     Constitutional: Alert and oriented. NAD.  Head: Normocephalic. Atraumatic.  Eyes: Conjunctivae clear. Sclera anicteric. Pupils equal and symmetric. Nose: No masses or lesions. No congestion or rhinorrhea. Mouth/Throat: Wearing mask.  Neck: No stridor. Trachea midline.  Cardiovascular: Normal rate, regular rhythm. Extremities well perfused. Respiratory: RR mid to upper 20s.  On 2 L nasal cannula with oxygen mid to high 90s. Gastrointestinal: Soft. Non-distended. Non-tender.  Genitourinary: Deferred. Musculoskeletal: No lower extremity edema. No deformities. Neurologic:  Normal speech and language. No gross focal or lateralizing neurologic deficits are appreciated.  Skin: Skin is warm, dry and intact. No rash noted. Psychiatric: Mood and affect are appropriate for situation.  EKG  Personally reviewed and  interpreted by myself.   Date: 02/22/2020 Time: 1626 Rate: 92 Rhythm: Sinus Axis: Normal Intervals: Within normal limits No acute ischemic changes No STEMI    Radiology  Personally reviewed available imaging myself.   CXR - IMPRESSION:  Subtle bilateral heterogeneous airspace opacity, in keeping with  COVID airspace disease.    Procedures  Procedure(s) performed (including critical care):  .Critical Care Performed by: Lilia Pro., MD Authorized by: Lilia Pro., MD   Critical care provider statement:    Critical care time (minutes):  35   Critical care was necessary to treat or prevent imminent or life-threatening deterioration of the following conditions:  Renal failure   Critical care was time spent personally by me on the following activities:  Discussions with consultants, evaluation of patient's response to treatment, examination of patient, ordering and performing treatments and interventions, ordering and review of laboratory studies, ordering and review of radiographic studies, pulse oximetry, re-evaluation of patient's condition, obtaining history from patient or surrogate and review of old charts     Initial Impression /  Assessment and Plan / MDM / ED Course  59 y.o. male known COVID positive who presents to the ED for worsening shortness of breath, confusion with fever, decreased intake  Ddx: progression of COVID, COVID pneumonia, febrile encephalopathy, electrolyte derangement  Will plan for labs, XR, supplemental oxygen. Temperature 100.1 on arrival, will provide ibuprofen.  Labs reveal slightly decreased K (3.3) and chloride (96), consistent with his decreased intake - will provide IVF and K repletion. AST and ALT slightly elevated as well c/s viral process.  LDH and ferritin inflammatory markers also mildly elevated.  XR reveals bilateral airspace opacities, consistent with COVID.  In this setting, along with his new oxygen requirement, will plan for Decadron, consult pharmacy for remdesivir dosing, and plan to admit.  Patient agreeable with plan.   _______________________________   As part of my medical decision making I have reviewed available labs, radiology tests, reviewed old records/performed chart review.    Final Clinical Impression(s) / ED Diagnosis  Final diagnoses:  COVID-19       Note:  This document was prepared using Dragon voice recognition software and may include unintentional dictation errors.     Lilia Pro., MD 02/23/20 1800

## 2020-02-23 NOTE — Telephone Encounter (Signed)
I spoke with Matthew Edison, NP about Matthew Turner and he is no longer eligible for MAB infusion- 1-2 days past infusion cut off. He had refused our recommendations last week- repeatedly about getting these infusions.  He also refused getting checked out at the hospital.We had called his wife- with his permission- and asked her to intervene and advise him to get the MAB infusion and or get checked out.  I called him again today. He sounds strong and has no respiratory audible symptoms. He endorses a fever of 104 degrees and  bad sweats on Saturday.  He did feel better  yesterday and today. He has been laying around  the house. He agrees to get checked out in the ED to make sure he does not have bilat PNA and /or blood clots. It is late in the disease for him to have such a high fever.  He does not have transportation, because he does not feel safe to drive. He is living alone at this time as his family is staying away from him during the illness. He agrees to call 911 after we discussed this risks of not getting evaluated.  He verbalized that he does understand the situation and promises to get dressed and call 911 for EMS transport to the ED.

## 2020-02-23 NOTE — Telephone Encounter (Signed)
He tested + 02/17/20 still not eligible for MAB infusion?  Per the + test has been <10 days He needs to go to ED agree and if they could coordinate MAB if eligible    [place]

## 2020-02-23 NOTE — ED Triage Notes (Signed)
Pt to ED via EMS from home with c/o hypoxia. Pt dx with COVID X 1 week ago, worsening SOB and fever over last few days. Pt states that he has also had episodes of confusion that occur when he feels nauseated intermittently. Pt 87% on RA with EMS, placed on 2L [place]. Denies cough. No antipyretics taken PTA. CBG WDL with EMS.

## 2020-02-23 NOTE — ED Notes (Signed)
Attempted to call report at this time. Placed on hold for 8 minutes. Will attempt to call again.

## 2020-02-24 LAB — CBC WITH DIFFERENTIAL/PLATELET
Abs Immature Granulocytes: 0.21 10*3/uL — ABNORMAL HIGH (ref 0.00–0.07)
Basophils Absolute: 0 10*3/uL (ref 0.0–0.1)
Basophils Relative: 0 %
Eosinophils Absolute: 0 10*3/uL (ref 0.0–0.5)
Eosinophils Relative: 0 %
HCT: 30.9 % — ABNORMAL LOW (ref 39.0–52.0)
Hemoglobin: 10.5 g/dL — ABNORMAL LOW (ref 13.0–17.0)
Immature Granulocytes: 2 %
Lymphocytes Relative: 9 %
Lymphs Abs: 0.8 10*3/uL (ref 0.7–4.0)
MCH: 30.1 pg (ref 26.0–34.0)
MCHC: 34 g/dL (ref 30.0–36.0)
MCV: 88.5 fL (ref 80.0–100.0)
Monocytes Absolute: 0.4 10*3/uL (ref 0.1–1.0)
Monocytes Relative: 4 %
Neutro Abs: 7.4 10*3/uL (ref 1.7–7.7)
Neutrophils Relative %: 85 %
Platelets: 395 10*3/uL (ref 150–400)
RBC: 3.49 MIL/uL — ABNORMAL LOW (ref 4.22–5.81)
RDW: 12.1 % (ref 11.5–15.5)
WBC: 8.8 10*3/uL (ref 4.0–10.5)
nRBC: 0 % (ref 0.0–0.2)

## 2020-02-24 LAB — GLUCOSE, CAPILLARY
Glucose-Capillary: 179 mg/dL — ABNORMAL HIGH (ref 70–99)
Glucose-Capillary: 163 mg/dL — ABNORMAL HIGH (ref 70–99)
Glucose-Capillary: 132 mg/dL — ABNORMAL HIGH (ref 70–99)

## 2020-02-24 LAB — C-REACTIVE PROTEIN: CRP: 12.2 mg/dL — ABNORMAL HIGH (ref <1.0)

## 2020-02-24 LAB — HEMOGLOBIN A1C
Hgb A1c MFr Bld: 5.6 % (ref 4.8–5.6)
Mean Plasma Glucose: 114.02 mg/dL

## 2020-02-24 LAB — BASIC METABOLIC PANEL
Anion gap: 5 (ref 5–15)
BUN: 17 mg/dL (ref 6–20)
CO2: 30 mmol/L (ref 22–32)
Calcium: 8.7 mg/dL — ABNORMAL LOW (ref 8.9–10.3)
Chloride: 99 mmol/L (ref 98–111)
Creatinine, Ser: 1.02 mg/dL (ref 0.61–1.24)
GFR calc Af Amer: >60 mL/min (ref >60)
GFR calc non Af Amer: >60 mL/min (ref >60)
Glucose, Bld: 249 mg/dL — ABNORMAL HIGH (ref 70–99)
Potassium: 4.4 mmol/L (ref 3.5–5.1)
Sodium: 134 mmol/L — ABNORMAL LOW (ref 135–145)

## 2020-02-24 LAB — MAGNESIUM: Magnesium: 2.8 mg/dL — ABNORMAL HIGH (ref 1.7–2.4)

## 2020-02-24 MED ORDER — NON FORMULARY: 1.0000 | Freq: Three times a day (TID) | Status: DC | PRN

## 2020-02-24 MED ORDER — ZINC SULFATE 220 (50 ZN) MG PO CAPS
220.0000 mg | ORAL_CAPSULE | Freq: Every day | ORAL | Status: DC
  Administered 2020-02-24 – 2020-02-27 (×4): 220 mg via ORAL
  Filled 2020-02-24 (×5): qty 1

## 2020-02-24 MED ORDER — OXYCODONE-ACETAMINOPHEN 10-325 MG PO TABS
1.0000 | ORAL_TABLET | Freq: Three times a day (TID) | ORAL | Status: DC | PRN
  Administered 2020-02-24 – 2020-02-27 (×4): 1 via ORAL
  Filled 2020-02-24 (×4): qty 1

## 2020-02-24 MED ORDER — ASCORBIC ACID 500 MG PO TABS
500.0000 mg | ORAL_TABLET | Freq: Every day | ORAL | Status: DC
  Administered 2020-02-24 – 2020-02-27 (×4): 500 mg via ORAL
  Filled 2020-02-24 (×4): qty 1

## 2020-02-24 MED ORDER — OXYCODONE-ACETAMINOPHEN 10-325 MG PO TABS: 1.0000 | ORAL_TABLET | ORAL | Status: DC | PRN

## 2020-02-24 MED ORDER — LATANOPROST 0.005 % OP SOLN
1.0000 [drp] | Freq: Every day | OPHTHALMIC | Status: DC
  Administered 2020-02-24 – 2020-02-26 (×3): 1 [drp] via OPHTHALMIC
  Filled 2020-02-24: qty 2.5

## 2020-02-24 MED ORDER — INSULIN ASPART 100 UNIT/ML ~~LOC~~ SOLN
0.0000 [IU] | Freq: Three times a day (TID) | SUBCUTANEOUS | Status: DC
  Administered 2020-02-24 (×2): 2 [IU] via SUBCUTANEOUS
  Administered 2020-02-25: 1 [IU] via SUBCUTANEOUS
  Administered 2020-02-25: 14:00:00 3 [IU] via SUBCUTANEOUS
  Administered 2020-02-26 – 2020-02-27 (×2): 2 [IU] via SUBCUTANEOUS
  Administered 2020-02-27: 1 [IU] via SUBCUTANEOUS
  Filled 2020-02-24 (×7): qty 1

## 2020-02-24 MED ORDER — INSULIN ASPART 100 UNIT/ML ~~LOC~~ SOLN: 0.0000 [IU] | Freq: Every day | SUBCUTANEOUS | Status: DC

## 2020-02-24 NOTE — Progress Notes (Signed)
PROGRESS NOTE    Matthew Turner  [id number] DOB: 1961-08-02 DOA: 02/23/2020 PCP: McLean-Scocuzza, Nino Glow, MD    Assessment & Plan:   Active Problems:   Hypertension   HLD (hyperlipidemia)   Chronic pain   Asthma   Pneumonia due to COVID-19 virus    Matthew Turner is a 59 y.o. male with medical history significant of asthma, HTN, HLD recently diagnosed with COVID-19 (02/17/2020) who presents to the emergency department for worsening COVID symptoms, particularly over the last several days. Patient states over the last several days he has had increased shortness of breath, feels winded with long sentences. Also reports increased episodes of confusion that seem to occur with onset/increase in his fever.    1. Acute hypoxic respiratory failure 2/2 Covid 19 pneumonia  - positive test at [facility] 02/17/20 (in care everywhere) with the subsequent development of progressive symptoms of fever, SOB and GI disturbance. He was hypoxemic to 87% on RA when EMS came to his home. Inflammatory markers were elevated at 12. PLAN: --continue Remdesivir and decadron 6 mg daily --suppl O2 to keep O2 sat >=90%, wean as able, currently on 2L --vitamins  2. HTN - he was on duplicate ARB medication  - will continue losartan and hold Diovan/Hct  3. HLD- continue home meds  4. Asthma - intermittent . Plan continue home meds - will add aerochamber  5. Chronic pain - 2/2 on the job injury - long term problem.  --continue brand Percocet (use supply from home) as non-formulary, 10mg  TID PRN   DVT prophylaxis: Lovenox SQ Code Status: Full code  Family Communication:  Status is: inpatient Dispo:   The patient is from: home Anticipated d/c is to: home Anticipated d/c date is: likely 2-3 days Patient currently is not medically stable to d/c due to: still on suppl O2 and IV Remdesivir, with very elevated CRP (not trended down yet).   Subjective and Interval History:  Pt reported  cough and diarrhea improved, appetite improved, but still had dyspnea.  No fever, chest pain, abdominal pain.   Objective: Vitals:   02/24/20 1400 02/24/20 1500 02/24/20 1600 02/24/20 1700  BP:    107/62  Pulse: 69 81 71 71  Resp: (!) 23 (!) 21 17 19   Temp:    98.5 F (36.9 C)  TempSrc:    Oral  SpO2: 96% 98% 98% 98%  Weight:      Height:        Intake/Output Summary (Last 24 hours) at 02/24/2020 1847 Last data filed at 02/23/2020 2031 Gross per 24 hour  Intake 500 ml  Output -  Net 500 ml   Filed Weights   02/23/20 1629 02/23/20 2239  Weight: 100.7 kg 96.3 kg    Examination:   Constitutional: NAD, AAOx3 HEENT: conjunctivae and lids normal, EOMI CV: RRR no M,R,G. Distal pulses +2.  No cyanosis.   RESP: reduced lung sounds, normal respiratory effort, on 2L GI: +BS, NTND Extremities: No effusions, edema, or tenderness in BLE SKIN: warm, dry and intact Neuro: II - XII grossly intact.  Sensation intact Psych: Normal mood and affect.  Appropriate judgement and reason   Data Reviewed: I have personally reviewed following labs and imaging studies  CBC: Recent Labs  Lab 02/23/20 1630 02/24/20 0443  WBC 8.6 8.8  NEUTROABS 6.8 7.4  HGB 11.2* 10.5*  HCT 33.2* 30.9*  MCV 88.1 88.5  PLT 380 [id number]   Basic Metabolic Panel: Recent Labs  Lab 02/23/20 1630 02/24/20  0443  NA 137 134*  K 3.3* 4.4  CL 96* 99  CO2 27 30  GLUCOSE 126* 249*  BUN 15 17  CREATININE 1.15 1.02  CALCIUM 8.6* 8.7*  MG  --  2.8*   GFR: Estimated Creatinine Clearance: 89.7 mL/min (by C-G formula based on SCr of 1.02 mg/dL). Liver Function Tests: Recent Labs  Lab 02/23/20 1630  AST 70*  ALT 115*  ALKPHOS 60  BILITOT 1.4*  PROT 7.1  ALBUMIN 3.3*   No results for input(s): LIPASE, AMYLASE in the last 168 hours. No results for input(s): AMMONIA in the last 168 hours. Coagulation Profile: No results for input(s): INR, PROTIME in the last 168 hours. Cardiac Enzymes: No results for  input(s): CKTOTAL, CKMB, CKMBINDEX, TROPONINI in the last 168 hours. BNP (last 3 results) No results for input(s): PROBNP in the last 8760 hours. HbA1C: Recent Labs    02/24/20 0443  HGBA1C 5.6   CBG: Recent Labs  Lab 02/24/20 1145 02/24/20 1644  GLUCAP 179* 163*   Lipid Profile: No results for input(s): CHOL, HDL, LDLCALC, TRIG, CHOLHDL, LDLDIRECT in the last 72 hours. Thyroid Function Tests: No results for input(s): TSH, T4TOTAL, FREET4, T3FREE, THYROIDAB in the last 72 hours. Anemia Panel: Recent Labs    02/23/20 1630  FERRITIN 552*   Sepsis Labs: No results for input(s): PROCALCITON, LATICACIDVEN in the last 168 hours.  No results found for this or any previous visit (from the past 240 hour(s)).    Radiology Studies: DG Chest Portable 1 View  Result Date: 02/23/2020 CLINICAL DATA:  Shortness of breath, COVID positive EXAM: PORTABLE CHEST 1 VIEW COMPARISON:  None. FINDINGS: The heart size and mediastinal contours are within normal limits. Subtle bilateral heterogeneous airspace opacity. The visualized skeletal structures are unremarkable. IMPRESSION: Subtle bilateral heterogeneous airspace opacity, in keeping with COVID airspace disease. Electronically Signed   By: Eddie Candle M.D.   On: 02/23/2020 16:52     Scheduled Meds: . atorvastatin  20 mg Oral Daily  . dexamethasone  6 mg Oral Q24H  . docusate sodium  100 mg Oral BID  . enoxaparin (LOVENOX) injection  40 mg Subcutaneous Q24H  . fluticasone  1 spray Each Nare Daily  . gabapentin  800 mg Oral TID  . insulin aspart  0-9 Units Subcutaneous TID WC  . losartan  50 mg Oral Daily  . Ensure Max Protein  11 oz Oral Daily  . vitamin B-12  500 mcg Oral Daily   Continuous Infusions: . remdesivir 100 mg in NS 100 mL 100 mg (02/24/20 0947)     LOS: 1 day     Enzo Bi, MD [facility] If 7PM-7AM, please contact night-coverage 02/24/2020, 6:47 PM

## 2020-02-24 NOTE — Progress Notes (Signed)
Pt has bag of meds at bedside. Controlled meds (percocet 10/325mg  and Gabapentin 800 tabs counted by 2 nurses bagged and sent to pharmacy.Provider to place order for patient to take own percocet. Per pharmacy nursing staff will need to go to pharmacy when patient requests this medication.

## 2020-02-25 ENCOUNTER — Ambulatory Visit: Payer: Medicare HMO

## 2020-02-25 DIAGNOSIS — D473 Essential (hemorrhagic) thrombocythemia: Secondary | ICD-10-CM

## 2020-02-25 DIAGNOSIS — R7401 Elevation of levels of liver transaminase levels: Secondary | ICD-10-CM

## 2020-02-25 LAB — CBC
HCT: 31.4 % — ABNORMAL LOW (ref 39.0–52.0)
Hemoglobin: 10.6 g/dL — ABNORMAL LOW (ref 13.0–17.0)
MCH: 30.3 pg (ref 26.0–34.0)
MCHC: 33.8 g/dL (ref 30.0–36.0)
MCV: 89.7 fL (ref 80.0–100.0)
Platelets: 422 10*3/uL — ABNORMAL HIGH (ref 150–400)
RBC: 3.5 MIL/uL — ABNORMAL LOW (ref 4.22–5.81)
RDW: 12.3 % (ref 11.5–15.5)
WBC: 13.4 10*3/uL — ABNORMAL HIGH (ref 4.0–10.5)
nRBC: 0 % (ref 0.0–0.2)

## 2020-02-25 LAB — BASIC METABOLIC PANEL
Anion gap: 9 (ref 5–15)
BUN: 18 mg/dL (ref 6–20)
CO2: 28 mmol/L (ref 22–32)
Calcium: 8.5 mg/dL — ABNORMAL LOW (ref 8.9–10.3)
Chloride: 100 mmol/L (ref 98–111)
Creatinine, Ser: 0.88 mg/dL (ref 0.61–1.24)
GFR calc Af Amer: >60 mL/min (ref >60)
GFR calc non Af Amer: >60 mL/min (ref >60)
Glucose, Bld: 156 mg/dL — ABNORMAL HIGH (ref 70–99)
Potassium: 3.9 mmol/L (ref 3.5–5.1)
Sodium: 137 mmol/L (ref 135–145)

## 2020-02-25 LAB — GLUCOSE, CAPILLARY
Glucose-Capillary: 132 mg/dL — ABNORMAL HIGH (ref 70–99)
Glucose-Capillary: 243 mg/dL — ABNORMAL HIGH (ref 70–99)
Glucose-Capillary: 112 mg/dL — ABNORMAL HIGH (ref 70–99)
Glucose-Capillary: 157 mg/dL — ABNORMAL HIGH (ref 70–99)

## 2020-02-25 LAB — C-REACTIVE PROTEIN: CRP: 7.6 mg/dL — ABNORMAL HIGH (ref <1.0)

## 2020-02-25 LAB — MAGNESIUM: Magnesium: 2.3 mg/dL (ref 1.7–2.4)

## 2020-02-25 MED ORDER — DOCUSATE SODIUM 100 MG PO CAPS
200.0000 mg | ORAL_CAPSULE | Freq: Two times a day (BID) | ORAL | Status: DC
  Administered 2020-02-25 – 2020-02-27 (×4): 200 mg via ORAL
  Filled 2020-02-25 (×4): qty 2

## 2020-02-25 MED ORDER — PSYLLIUM 95 % PO PACK
1.0000 | PACK | Freq: Every day | ORAL | Status: DC
  Administered 2020-02-25 – 2020-02-27 (×3): 1 via ORAL
  Filled 2020-02-25 (×3): qty 1

## 2020-02-25 NOTE — Progress Notes (Signed)
PROGRESS NOTE    Matthew Turner  [id number] DOB: 11-15-1960 DOA: 02/23/2020 PCP: McLean-Scocuzza, Nino Glow, MD      Assessment & Plan:   Active Problems:   Hypertension   HLD (hyperlipidemia)   Chronic pain   Asthma   Pneumonia due to COVID-19 virus   Acute hypoxic respiratory failure: secondary to COVID19 pneumonia dx [facility] 02/17/20. Inflammatory markers were elevated. Continue on  remdesivir, decadron, zinc & vitamin C. Continue on supplemental oxygen and wean as tolerated. Encourage incentive spirometry & flutter valve  COVID19 pneumonia:  Inflammatory markers were elevated. Continue on  remdesivir, decadron, zinc & vitamin C. Continue on supplemental oxygen and wean as tolerated. Encourage incentive spirometry & flutter valve   HTN: will continue losartan and hold Diovan/Hct  HLD: continue on statin   Asthma: intermittent . Continue home dose of albuterol   Chronic pain: secondary to on the job injury, long term problem. Continue brand Percocet (use supply from home) as non-formulary, 10mg  TID PRN  Thrombocytosis: etiology unclear, likely reactive. Will continue to monitor   Leukocytosis: infection vs steroid use. Will continue to monitor   Transaminitis: likely secondary COVID19. Will continue to monitor    Normocytic anemia: no need for a transfusion at this time. Will continue to monitor    DVT prophylaxis: lovenox Code Status: full  Family Communication:  Disposition Plan: likely d/c back home vs home health. Likely no barriers to d/c  Consultants:      Procedures:    Antimicrobials:   Status is: Inpatient  Remains inpatient appropriate because:IV treatments appropriate due to intensity of illness or inability to take PO   Dispo: The patient is from: Home              Anticipated d/c is to: Home              Anticipated d/c date is: 2 days              Patient currently is not medically stable to d/c.      Subjective: Pt  c/o shortness of breath   Objective: Vitals:   02/24/20 1500 02/24/20 1600 02/24/20 1700 02/25/20 0011  BP:   107/62 122/84  Pulse: 81 71 71 69  Resp: (!) 21 17 19 16   Temp:   98.5 F (36.9 C) 97.7 F (36.5 C)  TempSrc:   Oral Oral  SpO2: 98% 98% 98% 98%  Weight:      Height:        Intake/Output Summary (Last 24 hours) at 02/25/2020 0718 Last data filed at 02/25/2020 0552 Gross per 24 hour  Intake 240 ml  Output 800 ml  Net -560 ml   Filed Weights   02/23/20 1629 02/23/20 2239  Weight: 100.7 kg 96.3 kg    Examination:  General exam: Appears calm and comfortable  Respiratory system: decreased breath sounds b/l.  Cardiovascular system: S1 & S2+. No rubs, gallops or clicks.  Gastrointestinal system: Abdomen is nondistended, soft and nontender.  Normal bowel sounds heard. Central nervous system: Alert and oriented. Moves all 4 extremities  Psychiatry: Judgement and insight appear normal. Flat mood and affect     Data Reviewed: I have personally reviewed following labs and imaging studies  CBC: Recent Labs  Lab 02/23/20 1630 02/24/20 0443 02/25/20 0422  WBC 8.6 8.8 13.4*  NEUTROABS 6.8 7.4  --   HGB 11.2* 10.5* 10.6*  HCT 33.2* 30.9* 31.4*  MCV 88.1 88.5 89.7  PLT 380 395  [id number]*   Basic Metabolic Panel: Recent Labs  Lab 02/23/20 1630 02/24/20 0443 02/25/20 0422  NA 137 134* 137  K 3.3* 4.4 3.9  CL 96* 99 100  CO2 27 30 28   GLUCOSE 126* 249* 156*  BUN 15 17 18   CREATININE 1.15 1.02 0.88  CALCIUM 8.6* 8.7* 8.5*  MG  --  2.8* 2.3   GFR: Estimated Creatinine Clearance: 103.9 mL/min (by C-G formula based on SCr of 0.88 mg/dL). Liver Function Tests: Recent Labs  Lab 02/23/20 1630  AST 70*  ALT 115*  ALKPHOS 60  BILITOT 1.4*  PROT 7.1  ALBUMIN 3.3*   No results for input(s): LIPASE, AMYLASE in the last 168 hours. No results for input(s): AMMONIA in the last 168 hours. Coagulation Profile: No results for input(s): INR, PROTIME in the last 168  hours. Cardiac Enzymes: No results for input(s): CKTOTAL, CKMB, CKMBINDEX, TROPONINI in the last 168 hours. BNP (last 3 results) No results for input(s): PROBNP in the last 8760 hours. HbA1C: Recent Labs    02/24/20 0443  HGBA1C 5.6   CBG: Recent Labs  Lab 02/24/20 1145 02/24/20 1644 02/24/20 2101  GLUCAP 179* 163* 132*   Lipid Profile: No results for input(s): CHOL, HDL, LDLCALC, TRIG, CHOLHDL, LDLDIRECT in the last 72 hours. Thyroid Function Tests: No results for input(s): TSH, T4TOTAL, FREET4, T3FREE, THYROIDAB in the last 72 hours. Anemia Panel: Recent Labs    02/23/20 1630  FERRITIN 552*   Sepsis Labs: No results for input(s): PROCALCITON, LATICACIDVEN in the last 168 hours.  No results found for this or any previous visit (from the past 240 hour(s)).       Radiology Studies: DG Chest Portable 1 View  Result Date: 02/23/2020 CLINICAL DATA:  Shortness of breath, COVID positive EXAM: PORTABLE CHEST 1 VIEW COMPARISON:  None. FINDINGS: The heart size and mediastinal contours are within normal limits. Subtle bilateral heterogeneous airspace opacity. The visualized skeletal structures are unremarkable. IMPRESSION: Subtle bilateral heterogeneous airspace opacity, in keeping with COVID airspace disease. Electronically Signed   By: Eddie Candle M.D.   On: 02/23/2020 16:52        Scheduled Meds: . vitamin C  500 mg Oral Daily  . atorvastatin  20 mg Oral Daily  . dexamethasone  6 mg Oral Q24H  . docusate sodium  100 mg Oral BID  . enoxaparin (LOVENOX) injection  40 mg Subcutaneous Q24H  . fluticasone  1 spray Each Nare Daily  . gabapentin  800 mg Oral TID  . insulin aspart  0-9 Units Subcutaneous TID WC  . latanoprost  1 drop Both Eyes QHS  . losartan  50 mg Oral Daily  . Ensure Max Protein  11 oz Oral Daily  . vitamin B-12  500 mcg Oral Daily  . zinc sulfate  220 mg Oral Daily   Continuous Infusions: . remdesivir 100 mg in NS 100 mL 100 mg (02/24/20 0947)      LOS: 2 days    Time spent: 33 mins    Wyvonnia Dusky, MD [facility] Pager [phone]  If 7PM-7AM, please contact night-coverage [email or web address] 02/25/2020, 7:18 AM

## 2020-02-25 NOTE — Evaluation (Signed)
Physical Therapy Evaluation Patient Details Name: Matthew Turner MRN: [id number] DOB: 05/11/1961 Today's Date: 02/25/2020   History of Present Illness  presented to ER secondary to SOB, AMS, weakness; admitted for management of COVID-19 PNA (positive diagnosed 02/17/20 per chart).  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 Eager for mobility and "wash up" (CNA informed/aware).  Mild SOB noted with exertional activities, but recovers with activity pacing and intermittent rest periods; does report SOB much improved from initial admission.  Bilat UE/LE strength and ROM grossly WFL for basic transfers and gait; mild residual weakness L > R, LE > UE (previous SCI).  Able to complete bed mobility with mod indep; sit/stand, basic transfers and gait (100') with RW, cga/min assist.  Demonstrates reciprocal stepping pattern with fair step height/length; decreased cadence with slow, guarded turns; mod WBing bilat UEs on RW; no buckling, LOB or safety concern noted. Does desat to 86-87% on RA with exertional activities; will continue to monitor/assess need for supplemental O2 with activities in subsequent sessions.  REturned to 1L O2, sats >91% end of session. Would benefit from skilled PT to address above deficits and promote optimal return to PLOF.; Recommend transition to HHPT upon discharge from acute hospitalization.  SaO2 on room air at rest = 93% SaO2 on room air while ambulating = 86%     Follow Up Recommendations Home health PT    Equipment Recommendations  Rolling walker with 5" wheels    Recommendations for Other Services       Precautions / Restrictions Precautions Precautions: Fall Restrictions Weight Bearing Restrictions: No      Mobility  Bed Mobility Overal bed mobility: Modified Independent                 Transfers Overall transfer level: Needs assistance Equipment used: Rolling walker (2 wheeled) Transfers: Sit to/from Stand Sit to Stand: Min guard;Min assist         General transfer comment: heavy use of UEs to complete lift off  Ambulation/Gait Ambulation/Gait assistance: Min guard;Min assist Gait Distance (Feet): 100 Feet Assistive device: Rolling walker (2 wheeled)       General Gait Details: reciprocal stepping pattern with fair step height/length; decreased cadence with slow, guarded turns; mod WBing bilat UEs on RW; no buckling, LOB or safety concern noted  Stairs            Wheelchair Mobility    Modified Rankin (Stroke Patients Only)       Balance Overall balance assessment: Needs assistance Sitting-balance support: No upper extremity supported;Feet supported Sitting balance-Leahy Scale: Good     Standing balance support: Bilateral upper extremity supported Standing balance-Leahy Scale: Fair                               Pertinent Vitals/Pain Pain Assessment: No/denies pain    Home Living Family/patient expects to be discharged to:: Private residence Living Arrangements: Alone   Type of Home: House       Home Layout: Multi-level;Bed/bath upstairs        Prior Function Level of Independence: Independent         Comments: Indep with ADLs, household and community mobilization without assist device; denies fall history; no home O2.     Hand Dominance        Extremity/Trunk Assessment  Upper Extremity Assessment Upper Extremity Assessment: Overall WFL for tasks assessed    Lower Extremity Assessment Lower Extremity Assessment: Overall WFL for tasks assessed(R LE grossly 4 to 4+/5; L LE grossly 4-/5 throughout)       Communication   Communication: No difficulties  Cognition Arousal/Alertness: Awake/alert Behavior During Therapy: WFL for tasks assessed/performed Overall Cognitive Status: Within Functional  Limits for tasks assessed                                        General Comments      Exercises Other Exercises Other Exercises: Educated in role of PT, importance of progressive mobility, pursed lip breathing, signs/symptoms of fatigue; patient voiced understanding of all infomation.   Assessment/Plan    PT Assessment Patient needs continued PT services  PT Problem List Decreased strength;Decreased activity tolerance;Decreased balance;Decreased mobility;Decreased knowledge of precautions;Cardiopulmonary status limiting activity       PT Treatment Interventions DME instruction;Gait training;Stair training;Functional mobility training;Therapeutic activities;Therapeutic exercise;Balance training;Neuromuscular re-education;Patient/family education    PT Goals (Current goals can be found in the Care Plan section)  Acute Rehab PT Goals Patient Stated Goal: to get my strength back PT Goal Formulation: With patient Time For Goal Achievement: 03/10/20 Potential to Achieve Goals: Good    Frequency Min 2X/week   Barriers to discharge Decreased caregiver support      Co-evaluation               AM-PAC PT "6 Clicks" Mobility  Outcome Measure Help needed turning from your back to your side while in a flat bed without using bedrails?: None Help needed moving from lying on your back to sitting on the side of a flat bed without using bedrails?: None Help needed moving to and from a bed to a chair (including a wheelchair)?: A Little Help needed standing up from a chair using your arms (e.g., wheelchair or bedside chair)?: A Little Help needed to walk in hospital room?: A Little Help needed climbing 3-5 steps with a railing? : A Little 6 Click Score: 20    End of Session Equipment Utilized During Treatment: Gait belt Activity Tolerance: Patient tolerated treatment well Patient left: in chair;with call bell/phone within reach(no alarm required, fall risk  <10) Nurse Communication: Mobility status PT Visit Diagnosis: Muscle weakness (generalized) (M62.81);Difficulty in walking, not elsewhere classified (R26.2)    Time: [id number] PT Time Calculation (min) (ACUTE ONLY): 27 min   Charges:   PT Evaluation $PT Eval Moderate Complexity: 1 Mod PT Treatments $Therapeutic Activity: 8-22 mins        Acsa Estey H. Owens Shark, PT, DPT, NCS 02/25/20, 3:51 PM [phone]

## 2020-02-26 LAB — COMPREHENSIVE METABOLIC PANEL
ALT: 84 U/L — ABNORMAL HIGH (ref 0–44)
AST: 35 U/L (ref 15–41)
Albumin: 3 g/dL — ABNORMAL LOW (ref 3.5–5.0)
Alkaline Phosphatase: 60 U/L (ref 38–126)
Anion gap: 7 (ref 5–15)
BUN: 15 mg/dL (ref 6–20)
CO2: 30 mmol/L (ref 22–32)
Calcium: 8.2 mg/dL — ABNORMAL LOW (ref 8.9–10.3)
Chloride: 101 mmol/L (ref 98–111)
Creatinine, Ser: 0.89 mg/dL (ref 0.61–1.24)
GFR calc Af Amer: >60 mL/min (ref >60)
GFR calc non Af Amer: >60 mL/min (ref >60)
Glucose, Bld: 151 mg/dL — ABNORMAL HIGH (ref 70–99)
Potassium: 4.2 mmol/L (ref 3.5–5.1)
Sodium: 138 mmol/L (ref 135–145)
Total Bilirubin: 0.7 mg/dL (ref 0.3–1.2)
Total Protein: 6.7 g/dL (ref 6.5–8.1)

## 2020-02-26 LAB — CBC
HCT: 34.5 % — ABNORMAL LOW (ref 39.0–52.0)
Hemoglobin: 11.7 g/dL — ABNORMAL LOW (ref 13.0–17.0)
MCH: 30.2 pg (ref 26.0–34.0)
MCHC: 33.9 g/dL (ref 30.0–36.0)
MCV: 88.9 fL (ref 80.0–100.0)
Platelets: 544 10*3/uL — ABNORMAL HIGH (ref 150–400)
RBC: 3.88 MIL/uL — ABNORMAL LOW (ref 4.22–5.81)
RDW: 12.4 % (ref 11.5–15.5)
WBC: 10.6 10*3/uL — ABNORMAL HIGH (ref 4.0–10.5)
nRBC: 0 % (ref 0.0–0.2)

## 2020-02-26 LAB — GLUCOSE, CAPILLARY
Glucose-Capillary: 119 mg/dL — ABNORMAL HIGH (ref 70–99)
Glucose-Capillary: 151 mg/dL — ABNORMAL HIGH (ref 70–99)
Glucose-Capillary: 118 mg/dL — ABNORMAL HIGH (ref 70–99)
Glucose-Capillary: 90 mg/dL (ref 70–99)

## 2020-02-26 LAB — MAGNESIUM: Magnesium: 2.2 mg/dL (ref 1.7–2.4)

## 2020-02-26 LAB — C-REACTIVE PROTEIN: CRP: 3.7 mg/dL — ABNORMAL HIGH (ref <1.0)

## 2020-02-26 NOTE — Progress Notes (Signed)
PROGRESS NOTE    Matthew Turner  [id number] DOB: January 09, 1961 DOA: 02/23/2020 PCP: McLean-Scocuzza, Nino Glow, MD      Assessment & Plan:   Active Problems:   Hypertension   HLD (hyperlipidemia)   Chronic pain   Asthma   Pneumonia due to COVID-19 virus   Acute hypoxic respiratory failure: improving daily. Secondary to COVID19 pneumonia dx [place] [facility] 02/17/20. Inflammatory markers were elevated. Continue on remdesivir, decadron, zinc & vitamin C. Continue on supplemental oxygen and wean as tolerated. Encourage incentive spirometry & flutter valve  COVID19 pneumonia:  Inflammatory markers were elevated. Continue on  remdesivir, decadron, zinc & vitamin C. Continue on supplemental oxygen and wean as tolerated. Encourage incentive spirometry & flutter valve   HTN: will continue losartan and hold Diovan/Hct  HLD: continue on statin   Asthma: intermittent . Continue home dose of albuterol   Chronic pain: secondary to on the job injury, long term problem. Continue brand Percocet (use supply from home) as non-formulary, 10mg  TID PRN  Thrombocytosis: etiology unclear, likely reactive. Will continue to monitor   Leukocytosis: infection vs steroid use. Will continue to monitor   Transaminitis: likely secondary COVID19. AST is WNL today. Will continue to monitor    Normocytic anemia: no need for a transfusion at this time. Will continue to monitor    DVT prophylaxis: lovenox Code Status: full  Family Communication:  Disposition Plan: likely d/c home health. Likely no barriers to d/c  Consultants:      Procedures:    Antimicrobials:   Status is: Inpatient  Remains inpatient appropriate because:IV treatments appropriate due to intensity of illness or inability to take PO   Dispo: The patient is from: Home              Anticipated d/c is to: Home w/ home health               Anticipated d/c date is: 1 day              Patient currently is not medically stable  to d/c.      Subjective: Pt c/o shortness of breath but improved from day prior.  Objective: Vitals:   02/26/20 0400 02/26/20 0505 02/26/20 0545 02/26/20 0814  BP: (!) 118/51   (!) 141/100  Pulse:  74 66 73  Resp: 16 17 19 17   Temp:    98.2 F (36.8 C)  TempSrc:    Oral  SpO2: 100% 98% 100% 99%  Weight:      Height:        Intake/Output Summary (Last 24 hours) at 02/26/2020 1210 Last data filed at 02/26/2020 1000 Gross per 24 hour  Intake --  Output 2200 ml  Net -2200 ml   Filed Weights   02/23/20 1629 02/23/20 2239  Weight: 100.7 kg 96.3 kg    Examination:  General exam: Appears calm and comfortable  Respiratory system: diminished breath sounds b/l.  Cardiovascular system: S1 & S2+. No rubs, gallops or clicks.  Gastrointestinal system: Abdomen is nondistended, soft and nontender.  Normal bowel sounds heard. Central nervous system: Alert and oriented. Moves all 4 extremities  Psychiatry: Judgement and insight appear normal. Flat mood and affect     Data Reviewed: I have personally reviewed following labs and imaging studies  CBC: Recent Labs  Lab 02/23/20 1630 02/24/20 0443 02/25/20 0422 02/26/20 0712  WBC 8.6 8.8 13.4* 10.6*  NEUTROABS 6.8 7.4  --   --   HGB 11.2* 10.5* 10.6* 11.7*  HCT 33.2* 30.9* 31.4* 34.5*  MCV 88.1 88.5 89.7 88.9  PLT 380 395 422* 0000000*   Basic Metabolic Panel: Recent Labs  Lab 02/23/20 1630 02/24/20 0443 02/25/20 0422 02/26/20 0712  NA 137 134* 137 138  K 3.3* 4.4 3.9 4.2  CL 96* 99 100 101  CO2 27 30 28 30   GLUCOSE 126* 249* 156* 151*  BUN 15 17 18 15   CREATININE 1.15 1.02 0.88 0.89  CALCIUM 8.6* 8.7* 8.5* 8.2*  MG  --  2.8* 2.3 2.2   GFR: Estimated Creatinine Clearance: 102.7 mL/min (by C-G formula based on SCr of 0.89 mg/dL). Liver Function Tests: Recent Labs  Lab 02/23/20 1630 02/26/20 0712  AST 70* 35  ALT 115* 84*  ALKPHOS 60 60  BILITOT 1.4* 0.7  PROT 7.1 6.7  ALBUMIN 3.3* 3.0*   No results for  input(s): LIPASE, AMYLASE in the last 168 hours. No results for input(s): AMMONIA in the last 168 hours. Coagulation Profile: No results for input(s): INR, PROTIME in the last 168 hours. Cardiac Enzymes: No results for input(s): CKTOTAL, CKMB, CKMBINDEX, TROPONINI in the last 168 hours. BNP (last 3 results) No results for input(s): PROBNP in the last 8760 hours. HbA1C: Recent Labs    02/24/20 0443  HGBA1C 5.6   CBG: Recent Labs  Lab 02/25/20 1159 02/25/20 1642 02/25/20 2053 02/26/20 0819 02/26/20 1155  GLUCAP 243* 112* 157* 119* 151*   Lipid Profile: No results for input(s): CHOL, HDL, LDLCALC, TRIG, CHOLHDL, LDLDIRECT in the last 72 hours. Thyroid Function Tests: No results for input(s): TSH, T4TOTAL, FREET4, T3FREE, THYROIDAB in the last 72 hours. Anemia Panel: Recent Labs    02/23/20 1630  FERRITIN 552*   Sepsis Labs: No results for input(s): PROCALCITON, LATICACIDVEN in the last 168 hours.  No results found for this or any previous visit (from the past 240 hour(s)).       Radiology Studies: No results found.      Scheduled Meds: . vitamin C  500 mg Oral Daily  . atorvastatin  20 mg Oral Daily  . dexamethasone  6 mg Oral Q24H  . docusate sodium  200 mg Oral BID  . enoxaparin (LOVENOX) injection  40 mg Subcutaneous Q24H  . fluticasone  1 spray Each Nare Daily  . gabapentin  800 mg Oral TID  . insulin aspart  0-9 Units Subcutaneous TID WC  . latanoprost  1 drop Both Eyes QHS  . losartan  50 mg Oral Daily  . Ensure Max Protein  11 oz Oral Daily  . psyllium  1 packet Oral Daily  . vitamin B-12  500 mcg Oral Daily  . zinc sulfate  220 mg Oral Daily   Continuous Infusions: . remdesivir 100 mg in NS 100 mL 100 mg (02/26/20 0958)     LOS: 3 days    Time spent: 34 mins    Wyvonnia Dusky, MD [facility] Pager [phone]  If 7PM-7AM, please contact night-coverage [email or web address] 02/26/2020, 12:10 PM

## 2020-02-26 NOTE — TOC Initial Note (Signed)
Transition of Care [facility]) - Initial/Assessment Note    Patient Details  Name: Matthew Turner MRN: [id number] Date of Birth: 06-Apr-1961  Transition [facility]) CM/SW Contact:    Shelbie Hutching, RN Phone Number: 02/26/2020, 3:46 PM  Clinical Narrative:                 RNCM introduced self and explained role in discharge planning.  PT has recommended home health PT.  Patient does not feel that he needs any home health services at this time and does not want a walker.  Patient reports that he walks with a cane and he feels he will be just fine.  RNCM instructed patient to contact his nurse or TOC team and home health can be arranged.   Expected Discharge Plan: Home/Self Care Barriers to Discharge: Continued Medical Work up   Patient Goals and CMS Choice        Expected Discharge Plan and Services Expected Discharge Plan: Home/Self Care       Living arrangements for the past 2 months: Single Family Home                           HH Arranged: Refused HH          Prior Living Arrangements/Services Living arrangements for the past 2 months: Single Family Home Lives with:: Self Patient language and need for interpreter reviewed:: Yes Do you feel safe going back to the place where you live?: Yes      Need for Family Participation in Patient Care: No (Comment) Care giver support system in place?: No (comment) Current home services: DME(cane) Criminal Activity/Legal Involvement Pertinent to Current Situation/Hospitalization: No - Comment as needed  Activities of Daily Living Home Assistive Devices/Equipment: Dentures (specify type) ADL Screening (condition at time of admission) Patient's cognitive ability adequate to safely complete daily activities?: Yes Is the patient deaf or have difficulty hearing?: No Does the patient have difficulty seeing, even when wearing glasses/contacts?: No Does the patient have difficulty concentrating, remembering, or making decisions?:  No Patient able to express need for assistance with ADLs?: Yes Does the patient have difficulty dressing or bathing?: No Independently performs ADLs?: Yes (appropriate for developmental age) Does the patient have difficulty walking or climbing stairs?: No Weakness of Legs: None Weakness of Arms/Hands: None  Permission Sought/Granted                  Emotional Assessment   Attitude/Demeanor/Rapport: Engaged Affect (typically observed): Accepting Orientation: : Oriented to Self, Oriented to Place, Oriented to  Time, Oriented to Situation Alcohol / Substance Use: Not Applicable Psych Involvement: No (comment)  Admission diagnosis:  Pneumonia due to COVID-19 virus [U07.1, J12.82] COVID-19 [U07.1] Patient Active Problem List   Diagnosis Date Noted  . Pneumonia due to COVID-19 virus 02/23/2020  . Hip pain, acute, right 10/29/2019  . Cataract 10/29/2019  . Lumbar radiculopathy 10/09/2019  . Encounter for screening colonoscopy   . History of prostate cancer 04/01/2019  . Hypertension   . HLD (hyperlipidemia)   . Glaucoma   . Chronic pain   . Asthma   . Spinal cord injury, C5-C7 ([place])    PCP:  McLean-Scocuzza, Nino Glow, MD Pharmacy:   [facility] [phone] [address], [place] [address] Phone: [phone] Fax: [phone]     Social Determinants of Health (  SDOH) Interventions    Readmission Risk Interventions No flowsheet data found.

## 2020-02-27 ENCOUNTER — Telehealth: Payer: Self-pay | Admitting: Internal Medicine

## 2020-02-27 LAB — GLUCOSE, CAPILLARY
Glucose-Capillary: 129 mg/dL — ABNORMAL HIGH (ref 70–99)
Glucose-Capillary: 159 mg/dL — ABNORMAL HIGH (ref 70–99)

## 2020-02-27 LAB — COMPREHENSIVE METABOLIC PANEL
ALT: 71 U/L — ABNORMAL HIGH (ref 0–44)
AST: 27 U/L (ref 15–41)
Albumin: 3 g/dL — ABNORMAL LOW (ref 3.5–5.0)
Alkaline Phosphatase: 60 U/L (ref 38–126)
Anion gap: 6 (ref 5–15)
BUN: 16 mg/dL (ref 6–20)
CO2: 29 mmol/L (ref 22–32)
Calcium: 8.4 mg/dL — ABNORMAL LOW (ref 8.9–10.3)
Chloride: 104 mmol/L (ref 98–111)
Creatinine, Ser: 0.85 mg/dL (ref 0.61–1.24)
GFR calc Af Amer: >60 mL/min (ref >60)
GFR calc non Af Amer: >60 mL/min (ref >60)
Glucose, Bld: 150 mg/dL — ABNORMAL HIGH (ref 70–99)
Potassium: 4.3 mmol/L (ref 3.5–5.1)
Sodium: 139 mmol/L (ref 135–145)
Total Bilirubin: 0.6 mg/dL (ref 0.3–1.2)
Total Protein: 6.5 g/dL (ref 6.5–8.1)

## 2020-02-27 LAB — CBC
HCT: 34.7 % — ABNORMAL LOW (ref 39.0–52.0)
Hemoglobin: 11.5 g/dL — ABNORMAL LOW (ref 13.0–17.0)
MCH: 30.3 pg (ref 26.0–34.0)
MCHC: 33.1 g/dL (ref 30.0–36.0)
MCV: 91.3 fL (ref 80.0–100.0)
Platelets: 570 10*3/uL — ABNORMAL HIGH (ref 150–400)
RBC: 3.8 MIL/uL — ABNORMAL LOW (ref 4.22–5.81)
RDW: 12.4 % (ref 11.5–15.5)
WBC: 9.4 10*3/uL (ref 4.0–10.5)
nRBC: 0 % (ref 0.0–0.2)

## 2020-02-27 LAB — C-REACTIVE PROTEIN: CRP: 2.4 mg/dL — ABNORMAL HIGH (ref <1.0)

## 2020-02-27 MED ORDER — DEXAMETHASONE 6 MG PO TABS: 6.0000 mg | ORAL_TABLET | Freq: Every day | ORAL | 0 refills | Status: AC

## 2020-02-27 NOTE — Telephone Encounter (Signed)
Yes please make sure virtual visit   Thanks Gillis

## 2020-02-27 NOTE — Telephone Encounter (Signed)
[place] called pt is being discharged today for COVID  Pt is scheduled for 03/09/20 for a HFU

## 2020-02-27 NOTE — Telephone Encounter (Signed)
Noted. Will follow as appropriate.  

## 2020-02-27 NOTE — Care Management Important Message (Signed)
Important Message  Patient Details  Name: Matthew Turner MRN: [id number] Date of Birth: 1961/08/26   Medicare Important Message Given:  Yes     Shelbie Hutching, RN 02/27/2020, 11:40 AM

## 2020-02-27 NOTE — Telephone Encounter (Signed)
I made a note in appt notes to myself to call pt closer to appt date. I will make sure he understands this is a virtual.

## 2020-02-27 NOTE — TOC Transition Note (Signed)
Transition of Care [facility]) - CM/SW Discharge Note   Patient Details  Name: Matthew Turner MRN: [id number] Date of Birth: 10-07-1961  Transition of Care [facility]) CM/SW Contact:  Shelbie Hutching, RN Phone Number: 02/27/2020, 2:47 PM   Clinical Narrative:     Patient is medically ready for discharge home.  Patient is unable to get a ride home, he reports that he can't expose his daughter and his wife is out of town.  [facility] will provide transportation home because patient is under quarantine for COVID 19.    Final next level of care: Home/Self Care Barriers to Discharge: Barriers Resolved   Patient Goals and CMS Choice        Discharge Placement              Patient chooses bed at: (Going home) Patient to be transferred to facility by: Transport home by [place] Name of family member notified: Patient Patient and family notified of of transfer: 02/27/20  Discharge Plan and Services   Discharge Planning Services: Other - See comment(transportation)                      HH Arranged: Refused HH          Social Determinants of Health (SDOH) Interventions     Readmission Risk Interventions No flowsheet data found.

## 2020-02-27 NOTE — Discharge Summary (Signed)
Physician Discharge Summary  Matthew Turner [id number] DOB: 03/28/1961 DOA: 02/23/2020  PCP: McLean-Scocuzza, Nino Glow, MD  Admit date: 02/23/2020 Discharge date: 02/27/2020  Admitted From: home Disposition:  home  Recommendations for Outpatient Follow-up:  1. Follow up with PCP in 2 weeks   Home Health: no Equipment/Devices:  Discharge Condition: stable  CODE STATUS: full  Diet recommendation: Heart Healthy  Brief/Interim Summary: HPI was taken from Dr. Linda Hedges: Matthew Turner is a 59 y.o. male with medical history significant of asthma, HTN, HLD recently diagnosed with COVID-19 (02/17/2020) who presents to the emergency department for worsening COVID symptoms, particularly over the last several days. Patient states over the last several days he has had increased shortness of breath, feels winded with long sentences. Also reports increased episodes of confusion that seem to occur with onset/increase in his fever. States he feels like his mind goes blank when he is trying to talk. He is aware that this is happening. No other associated neurological symptoms during these periods. No seizure-like activity or history of seizures. Also experiencing intermittent discomfort in his abdomen, describes them as gas-like pains. Associated with nausea, no vomiting. Has been experiencing diarrhea throughout his course. Denies chest pain. Reports decreased by mouth due to nausea.Patient discussed his symptoms with his primary care doctor via the phone, who recommended he activate EMS. On EMS arrival, patient was 87% on room air, placed on 2 L nasal cannula with improvement to the mid to high 90s. Does not normally wear supplemental oxygen.  in the HPI section of the daily progress note).  ED Course: hemodynamically stable. Tachypnic. Labs revealed elevated LDH, ferritin, CRP pending. CXR with bilateral infiltrates c/w viral pneumonia. TRH consulted to admit for continued treatment including  oxygen support, remdesivir and steroids.  Hospital Course from Dr. Lenise Herald 5/5-02/27/20: Pt was found to have COVID19 pneumonia and was treated w/ IV remdesivir, decadron, zinc, vitamin C, bronchodilators & incentive spirometry. Pt did require supplemental oxygen but since has been weaned off of oxygen. Pt was d/c home w/ po decadron to finish the course at home. Pt knows he must quarantine for a total of 14 days. Pt verbalized his understanding.    Discharge Diagnoses:  Active Problems:   Hypertension   HLD (hyperlipidemia)   Chronic pain   Asthma   Pneumonia due to COVID-19 virus  Acute hypoxic respiratory failure: resolved. Secondary to COVID19 pneumonia dx [place] [facility] 02/17/20. Inflammatory markers were elevated. Continue on remdesivir,decadron, zinc & vitamin C. Weaned off of supplemental oxygen. Encourage incentive spirometry & flutter valve  COVID19 pneumonia:  Inflammatory markers were elevated. Continue on  remdesivir,decadron, zinc & vitamin C. Continue on supplemental oxygen and wean as tolerated. Encourage incentive spirometry & flutter valve   HTN: will continue losartan and hold Diovan/Hct  HLD: continue on statin   Asthma: intermittent . Continue home dose of albuterol   Chronic pain: secondary to on the job injury, long term problem.Continue brand Percocet(use supply from home) as non-formulary, 10mg  TID PRN  Thrombocytosis: etiology unclear, likely reactive. Will continue to monitor   Leukocytosis: infection vs steroid use. Will continue to monitor   Transaminitis: likely secondary COVID19. AST is WNL today. ALT is trending down. Will continue to monitor    Normocytic anemia: no need for a transfusion at this time. Will continue to monitor   Discharge Instructions  Discharge Instructions    Diet - low sodium heart healthy   Complete by: As directed    Discharge instructions  Complete by: As directed    F/u PCP in 2 weeks; Must quarantine  for a total of 14 days starting with the day you tested positive for COVID19   Increase activity slowly   Complete by: As directed      Allergies as of 02/27/2020      Reactions   Percocet [oxycodone-acetaminophen]    Certain formulations itching       Medication List    TAKE these medications   albuterol 108 (90 Base) MCG/ACT inhaler Commonly known as: ProAir HFA Inhale 1-2 puffs into the lungs every 6 (six) hours as needed for wheezing or shortness of breath.   atorvastatin 20 MG tablet Commonly known as: LIPITOR Take 1 tablet (20 mg total) by mouth daily.   B-12 500 MCG Tabs Take 500 mcg by mouth daily.   dexamethasone 6 MG tablet Commonly known as: DECADRON Take 1 tablet (6 mg total) by mouth daily for 7 days.   Dymista 137-50 MCG/ACT Susp Generic drug: Azelastine-Fluticasone Place 1 spray into both nostrils 2 (two) times daily.   gabapentin 800 MG tablet Commonly known as: NEURONTIN Take 1 tablet (800 mg total) by mouth 3 (three) times daily.   losartan 50 MG tablet Commonly known as: COZAAR Take 1 tablet (50 mg total) by mouth daily.   Lumigan 0.01 % Soln Generic drug: bimatoprost Place 1 drop into both eyes at bedtime. 1 drop affected eye   oxyCODONE-acetaminophen 10-325 MG tablet Commonly known as: PERCOCET Take 1 tablet by mouth every 8 (eight) hours as needed for pain. Dr. Vira Agar [facility] [place]   valsartan-hydrochlorothiazide 160-12.5 MG tablet Commonly known as: DIOVAN-HCT Take 1 tablet by mouth daily.   Vitamin B-Complex Tabs Take 1 tablet by mouth daily in the afternoon.   vitamin C 1000 MG tablet Take 1,000 mg by mouth daily.   Vitamin D (Ergocalciferol) 50 MCG (2000 UT) Caps Take 50,000 Units by mouth 3 (three) times a week.       Allergies  Allergen Reactions  . Percocet [Oxycodone-Acetaminophen]     Certain formulations itching      Consultations:     Procedures/Studies: DG Chest Portable 1  View  Result Date: 02/23/2020 CLINICAL DATA:  Shortness of breath, COVID positive EXAM: PORTABLE CHEST 1 VIEW COMPARISON:  None. FINDINGS: The heart size and mediastinal contours are within normal limits. Subtle bilateral heterogeneous airspace opacity. The visualized skeletal structures are unremarkable. IMPRESSION: Subtle bilateral heterogeneous airspace opacity, in keeping with COVID airspace disease. Electronically Signed   By: Eddie Candle M.D.   On: 02/23/2020 16:52      Subjective: Pt denies any complaints    Discharge Exam: Vitals:   02/27/20 0620 02/27/20 0822  BP: (!) 134/96 125/84  Pulse: 78 88  Resp: 16 17  Temp: 97.7 F (36.5 C) 98.6 F (37 C)  SpO2: 98% 100%   Vitals:   02/26/20 0814 02/26/20 1704 02/27/20 0620 02/27/20 0822  BP: (!) 141/100 101/64 (!) 134/96 125/84  Pulse: 73 71 78 88  Resp: 17 19 16 17   Temp: 98.2 F (36.8 C) 98.2 F (36.8 C) 97.7 F (36.5 C) 98.6 F (37 C)  TempSrc: Oral  Oral   SpO2: 99% 97% 98% 100%  Weight:      Height:        General: Pt is alert, awake, not in acute distress Cardiovascular:S1/S2 +, no rubs, no gallops Respiratory: decreased breath sounds b/l otherwise clear  Abdominal: Soft, NT, ND,  bowel sounds + Extremities: no edema, no cyanosis    The results of significant diagnostics from this hospitalization (including imaging, microbiology, ancillary and laboratory) are listed below for reference.     Microbiology: No results found for this or any previous visit (from the past 240 hour(s)).   Labs: BNP (last 3 results) No results for input(s): BNP in the last 8760 hours. Basic Metabolic Panel: Recent Labs  Lab 02/23/20 1630 02/24/20 0443 02/25/20 0422 02/26/20 0712 02/27/20 0623  NA 137 134* 137 138 139  K 3.3* 4.4 3.9 4.2 4.3  CL 96* 99 100 101 104  CO2 27 30 28 30 29   GLUCOSE 126* 249* 156* 151* 150*  BUN 15 17 18 15 16   CREATININE 1.15 1.02 0.88 0.89 0.85  CALCIUM 8.6* 8.7* 8.5* 8.2* 8.4*  MG  --   2.8* 2.3 2.2  --    Liver Function Tests: Recent Labs  Lab 02/23/20 1630 02/26/20 0712 02/27/20 0623  AST 70* 35 27  ALT 115* 84* 71*  ALKPHOS 60 60 60  BILITOT 1.4* 0.7 0.6  PROT 7.1 6.7 6.5  ALBUMIN 3.3* 3.0* 3.0*   No results for input(s): LIPASE, AMYLASE in the last 168 hours. No results for input(s): AMMONIA in the last 168 hours. CBC: Recent Labs  Lab 02/23/20 1630 02/24/20 0443 02/25/20 0422 02/26/20 0712 02/27/20 0623  WBC 8.6 8.8 13.4* 10.6* 9.4  NEUTROABS 6.8 7.4  --   --   --   HGB 11.2* 10.5* 10.6* 11.7* 11.5*  HCT 33.2* 30.9* 31.4* 34.5* 34.7*  MCV 88.1 88.5 89.7 88.9 91.3  PLT 380 395 422* 544* 570*   Cardiac Enzymes: No results for input(s): CKTOTAL, CKMB, CKMBINDEX, TROPONINI in the last 168 hours. BNP: Invalid input(s): POCBNP CBG: Recent Labs  Lab 02/26/20 0819 02/26/20 1155 02/26/20 1703 02/26/20 2014 02/27/20 0821  GLUCAP 119* 151* 118* 90 129*   D-Dimer No results for input(s): DDIMER in the last 72 hours. Hgb A1c No results for input(s): HGBA1C in the last 72 hours. Lipid Profile No results for input(s): CHOL, HDL, LDLCALC, TRIG, CHOLHDL, LDLDIRECT in the last 72 hours. Thyroid function studies No results for input(s): TSH, T4TOTAL, T3FREE, THYROIDAB in the last 72 hours.  Invalid input(s): FREET3 Anemia work up No results for input(s): VITAMINB12, FOLATE, FERRITIN, TIBC, IRON, RETICCTPCT in the last 72 hours. Urinalysis    Component Value Date/Time   COLORURINE YELLOW 07/02/2019 0801   APPEARANCEUR TURBID (A) 07/02/2019 0801   LABSPEC 1.027 07/02/2019 0801   PHURINE < OR = 5.0 07/02/2019 0801   GLUCOSEU NEGATIVE 07/02/2019 0801   HGBUR NEGATIVE 07/02/2019 0801   KETONESUR NEGATIVE 07/02/2019 0801   PROTEINUR TRACE (A) 07/02/2019 0801   NITRITE NEGATIVE 07/02/2019 0801   LEUKOCYTESUR NEGATIVE 07/02/2019 0801   Sepsis Labs Invalid input(s): PROCALCITONIN,  WBC,  LACTICIDVEN Microbiology No results found for this or any  previous visit (from the past 240 hour(s)).   Time coordinating discharge: Over 30 minutes  SIGNED:   Wyvonnia Dusky, MD  [facility] 02/27/2020, 11:04 AM Pager   If 7PM-7AM, please contact night-coverage [email or web address]

## 2020-02-27 NOTE — Telephone Encounter (Signed)
For your information  

## 2020-02-27 NOTE — Discharge Instructions (Signed)
  COVID-19: Quarantine vs. Isolation QUARANTINE keeps someone who was in close contact with someone who has COVID-19 away from others. If you had close contact with a person who has COVID-19  Stay home until 14 days after your last contact.  Check your temperature twice a day and watch for symptoms of COVID-19.  If possible, stay away from people who are at higher-risk for getting very sick from COVID-19. ISOLATION keeps someone who is sick or tested positive for COVID-19 without symptoms away from others, even in their own home. If you are sick and think or know you have COVID-19  Stay home until after ? At least 10 days since symptoms first appeared and ? At least 24 hours with no fever without fever-reducing medication and ? Symptoms have improved If you tested positive for COVID-19 but do not have symptoms  Stay home until after ? 10 days have passed since your positive test If you live with others, stay in a specific "sick room" or area and away from other people or animals, including pets. Use a separate bathroom, if available. [email or web address] 05/12/2019 This information is not intended to replace advice given to you by your health care provider. Make sure you discuss any questions you have with your health care provider. Document Revised: 09/25/2019 Document Reviewed: 09/25/2019 Elsevier Patient Education  [place] Can Do to Manage Your COVID-19 Symptoms at Home If you have possible or confirmed COVID-19: 1. Stay home from work and school. And stay away from other public places. If you must go out, avoid using any kind of public transportation, ridesharing, or taxis. 2. Monitor your symptoms carefully. If your symptoms get worse, call your healthcare provider immediately. 3. Get rest and stay hydrated. 4. If you have a medical appointment, call the healthcare provider ahead of time and tell them that you have or may have COVID-19. 5. For medical  emergencies, call 911 and notify the dispatch personnel that you have or may have COVID-19. 6. Cover your cough and sneezes with a tissue or use the inside of your elbow. 7. Wash your hands often with soap and water for at least 20 seconds or clean your hands with an alcohol-based hand sanitizer that contains at least 60% alcohol. 8. As much as possible, stay in a specific room and away from other people in your home. Also, you should use a separate bathroom, if available. If you need to be around other people in or outside of the home, wear a mask. 9. Avoid sharing personal items with other people in your household, like dishes, towels, and bedding. 10. Clean all surfaces that are touched often, like counters, tabletops, and doorknobs. Use household cleaning sprays or wipes according to the label instructions. [email or web address] 04/23/2019 This information is not intended to replace advice given to you by your health care provider. Make sure you discuss any questions you have with your health care provider. Document Revised: 09/25/2019 Document Reviewed: 09/25/2019 Elsevier Patient Education  [place].

## 2020-03-01 ENCOUNTER — Telehealth: Payer: Self-pay

## 2020-03-01 NOTE — Telephone Encounter (Signed)
First failed attempt to reach patient for transition of care. No answer. Left message stating I will call back tomorrow as appropriate.

## 2020-03-02 NOTE — Telephone Encounter (Signed)
Transition Care Management Follow-up Telephone Call  Date of discharge and from where: 02/27/20 from [facility].  How have you been since you were released from the hospital? Patient states," I am doing much better but still weak. I wake up at night sweating. Appetite and fluid intake are good. Bladder/BM appropriate." Denies dizziness, falls, nausea, shortness of breath, confusion, chest pain, ADB pain.  Any questions or concerns? None. Encouraged to call back with any questions or concerns.   Items Reviewed:  Did the pt receive and understand the discharge instructions provided? Increase activity as tolerated. Take all medications as directed.   Medications -taking all medications as directed with no issues.   Any new allergies since your discharge? No  Dietary orders reviewed? Low sodium, heart healthy. Drinking Ensure daily.   Do you have support at home? Lives alone. Daughter and neighbors call and check in.   Functional Questionnaire: (I = Independent and D = Dependent) ADLs: I  Follow up appointments reviewed:   PCP Hospital f/u appt confirmed?  Scheduled to see Dr. Linus Orn McLean-Scocuzza on 03/09/20 @ 10:00, virtual.  Are transportation arrangements needed? None.  If their condition worsens, is the pt aware to call PCP or go to the Emergency Dept.? Yes  Was the patient provided with contact information for the PCP's office or ED? Yes  Was to pt encouraged to call back with questions or concerns? Yes

## 2020-03-09 ENCOUNTER — Encounter: Payer: Self-pay | Admitting: Internal Medicine

## 2020-03-09 ENCOUNTER — Telehealth (INDEPENDENT_AMBULATORY_CARE_PROVIDER_SITE_OTHER): Payer: Medicare HMO | Admitting: Internal Medicine

## 2020-03-09 VITALS — BP 138/82 | HR 84 | Ht 70.0 in | Wt 210.0 lb

## 2020-03-09 DIAGNOSIS — U071 COVID-19: Secondary | ICD-10-CM

## 2020-03-09 DIAGNOSIS — R4189 Other symptoms and signs involving cognitive functions and awareness: Secondary | ICD-10-CM

## 2020-03-09 DIAGNOSIS — R413 Other amnesia: Secondary | ICD-10-CM | POA: Diagnosis not present

## 2020-03-09 DIAGNOSIS — F488 Other specified nonpsychotic mental disorders: Secondary | ICD-10-CM

## 2020-03-09 DIAGNOSIS — R748 Abnormal levels of other serum enzymes: Secondary | ICD-10-CM

## 2020-03-09 DIAGNOSIS — J4520 Mild intermittent asthma, uncomplicated: Secondary | ICD-10-CM

## 2020-03-09 DIAGNOSIS — J1282 Pneumonia due to coronavirus disease 2019: Secondary | ICD-10-CM

## 2020-03-09 DIAGNOSIS — I10 Essential (primary) hypertension: Secondary | ICD-10-CM

## 2020-03-09 DIAGNOSIS — E876 Hypokalemia: Secondary | ICD-10-CM

## 2020-03-09 MED ORDER — LOSARTAN POTASSIUM 50 MG PO TABS: 50.0000 mg | ORAL_TABLET | Freq: Every day | ORAL | 3 refills | Status: DC

## 2020-03-09 MED ORDER — ALBUTEROL SULFATE HFA 108 (90 BASE) MCG/ACT IN AERS: 1.0000 | INHALATION_SPRAY | Freq: Four times a day (QID) | RESPIRATORY_TRACT | 11 refills | Status: DC | PRN

## 2020-03-09 MED ORDER — ZINC 100 MG PO TABS: 1.0000 | ORAL_TABLET | Freq: Every day | ORAL | 3 refills | Status: AC

## 2020-03-09 MED ORDER — VITAMIN D (ERGOCALCIFEROL) 50 MCG (2000 UT) PO CAPS: 4000.0000 [IU] | ORAL_CAPSULE | Freq: Every day | ORAL | 3 refills | Status: DC

## 2020-03-09 MED ORDER — QUERCETIN 250 MG PO TABS: 1.0000 | ORAL_TABLET | Freq: Two times a day (BID) | ORAL | 3 refills | Status: DC

## 2020-03-09 MED ORDER — DYMISTA 137-50 MCG/ACT NA SUSP: 1.0000 | Freq: Two times a day (BID) | NASAL | 11 refills | Status: DC | PRN

## 2020-03-09 NOTE — Progress Notes (Signed)
Virtual Visit via Video Note  I connected with [name]  on 03/09/20 at 10:20 AM EDT by a video enabled telemedicine application and verified that I am speaking with the correct person using two identifiers.  Location patient: home Location provider:work or home office Persons participating in the virtual visit: patient, provider  I discussed the limitations of evaluation and management by telemedicine and the availability of in person appointments. The patient expressed understanding and agreed to proceed.   HPI: 1. HFU 02/23/20 to 5/7/21[facility] covid pneumonia with hypoxia given O2 (not currently on) remdesivir, decadron, vitamin C 1000, zinc 100 , vitamin D 2000 IU, mucinex pt taking and feeling better still has mild sob and feels weak but declines PT for now. C/o memory loss and brain fog  He contracted covid from cousins wife who is in the hospital on ventilator and his cousin who he rode in the car with to a funeral died recently.  This is the only time he did not wear his mask and contracted covid  Reduced po but appetite coming back lost 13 lbs since sick and appetite improved Labs indicate elevated lfts and covid 19 pneumonia will repeat   2. Memory loss with FH dementia father, sister. Brain fog with covid 19    ROS: See pertinent positives and negatives per HPI.  Past Medical History:  Diagnosis Date  . Asthma    dust  . Cancer ([place])    prostate; early 52s   . Chronic pain    f/u pain clinic in [place]; arms, neck, back-pain clinic in [place]  . [place]    02/17/20  . Glaucoma   . HLD (hyperlipidemia)   . Hypertension   . Spinal cord injury, C5-C7 ([place])    C6.C7 in 2008 injury at work he was paralyzed from waist down but walking again;workers comp    Past Surgical History:  Procedure Laterality Date  . COLONOSCOPY WITH PROPOFOL N/A 08/18/2019   Procedure: COLONOSCOPY WITH PROPOFOL;  Surgeon: Lin Landsman, MD;  Location: [facility] ENDOSCOPY;  Service: Gastroenterology;   Laterality: N/A;  . LUMBAR LAMINECTOMY    . PROSTATECTOMY     had radiation no injections h/o prostate pump     Family History  Problem Relation Age of Onset  . Heart failure Mother   . Cancer Father        prostate  . Dementia Father   . Cancer Brother        prostate  . Pancreatic cancer Paternal Grandmother   . Prostate cancer Other   . Prostate cancer Brother        prostate  . Dementia Sister     SOCIAL HX: married    Current Outpatient Medications:  .  albuterol (PROAIR HFA) 108 (90 Base) MCG/ACT inhaler, Inhale 1-2 puffs into the lungs every 6 (six) hours as needed for wheezing or shortness of breath., Disp: 18 g, Rfl: 11 .  Ascorbic Acid (VITAMIN C) 1000 MG tablet, Take 1,000 mg by mouth daily. , Disp: , Rfl:  .  atorvastatin (LIPITOR) 20 MG tablet, Take 1 tablet (20 mg total) by mouth daily., Disp: 90 tablet, Rfl: 3 .  B Complex Vitamins (VITAMIN B-COMPLEX) TABS, Take 1 tablet by mouth daily in the afternoon. , Disp: , Rfl:  .  bimatoprost (LUMIGAN) 0.01 % SOLN, Place 1 drop into both eyes at bedtime. 1 drop affected eye, Disp: 7.5 mL, Rfl: 5 .  Cyanocobalamin (B-12) 500 MCG TABS, Take 500 mcg by mouth  daily., Disp: 90 tablet, Rfl: 3 .  DYMISTA 137-50 MCG/ACT SUSP, Place 1 spray into both nostrils 2 (two) times daily as needed., Disp: 23 g, Rfl: 11 .  gabapentin (NEURONTIN) 800 MG tablet, Take 1 tablet (800 mg total) by mouth 3 (three) times daily., Disp: , Rfl:  .  losartan (COZAAR) 50 MG tablet, Take 1 tablet (50 mg total) by mouth daily., Disp: 90 tablet, Rfl: 3 .  oxyCODONE-acetaminophen (PERCOCET) 10-325 MG tablet, Take 1 tablet by mouth every 8 (eight) hours as needed for pain. Dr. Vira [facility] [place], Disp: , Rfl:  .  Polyethylene Glycol 400 (BLINK TEARS OP), Apply to eye., Disp: , Rfl:  .  Vitamin D, Ergocalciferol, 50 MCG (2000 UT) CAPS, Take 4,000 Units by mouth daily., Disp: 180 capsule, Rfl: 3 .  Quercetin 250 MG TABS, Take 1 tablet (250  mg total) by mouth in the morning and at bedtime., Disp: 180 tablet, Rfl: 3 .  Zinc 100 MG TABS, Take 1 tablet (100 mg total) by mouth daily., Disp: 90 tablet, Rfl: 3  EXAM:  VITALS per patient if applicable:  GENERAL: alert, oriented, appears well and in no acute distress  HEENT: atraumatic, conjunttiva clear, no obvious abnormalities on inspection of external nose and ears  NECK: normal movements of the head and neck  LUNGS: on inspection no signs of respiratory distress, breathing rate appears normal, no obvious gross SOB, gasping or wheezing  CV: no obvious cyanosis  MS: moves all visible extremities without noticeable abnormality  PSYCH/NEURO: pleasant and cooperative, no obvious depression or anxiety, speech and thought processing grossly intact  ASSESSMENT AND PLAN:  Discussed the following assessment and plan:  Pneumonia due to COVID-19 virus - Plan: Zinc 100 MG TABS, Quercetin 250 MG TABS bid, vitamin D 4000 IU qd, vitamin C 1000 mg qd mucinex dm otc DG Chest 2 View in 3 months  Doing well off O2  Given zpack 02/16/20  Prn albuterol inhaler   Essential hypertension - Plan: losartan (COZAAR) 50 MG tablet  Memory loss - Plan: Ambulatory referral to Neurology Dr. Manuella Ghazi Brain fog - Plan: Ambulatory referral to Neurology  Elevated liver enzymes - Plan: Comprehensive metabolic panel Hypokalemia - Plan: Comprehensive metabolic panel  HM Flu shot utd  prevnar utd pna 23 in future 1 year 11/10/20  [facility] 01/31/20 will need 2nd dose in 05/29/20  Tdap as well and shingrixsent Rx today   Colonoscopy10/26/20 tubular adenoma f/u in 3 years  UndetectablePSA9/9/2020h/o prostate cancer with h/o removal and radiation no injections Former smoker quit in 42s  Occasional etoh   Given names of eye MD and dentists in the area  Former PCP Dr. Genia Plants [place] in [place] [address]  [place]  [place]GD 09/25/10 esophagitis duodenitis mildly enlarged major papilla  path neg h pylori chronic peptic duodentitis c/w adenomatous change; GERD   EGD path 06/05/12 gastric antrum bx negative duodenum negative no H pylori  EGD 01/23/15 mild erythema in stomach   EGD 01/24/17-see report scanned in path 01/24/17 gastric antrum bx and duodenum bx negative no H pylori  Upper EUS 03/08/11 see report scanned in chart Upper EUS 01/23/15 see report scanned inchart Upper EUS 01/24/17 GB sludge, no gallstones, bile duct normal, hyperechoic foci and strands in pancreas and lobularity but no parenchymal calcifications/pancreatic ductal dilatation noted, 2 small cysts in pancreas body 4-5 mm liver with 6 mm cyst no lymphadenopathy  Colonoscopy 08/02/13 hyperplastic polyp sigmoid colon  and mild hyperplastic changes rectum   Colonoscopy 07/27/10 semi pedunculated polyp splenic flexure IH/EH  Colonoscopy 07/31/08 pedunculated polyp transverse colon few diminutive sessile polyps rectum diverticulosis IH/EH   -we discussed possible serious and likely etiologies, options for evaluation and workup, limitations of telemedicine visit vs in person visit, treatment, treatment risks and precautions. Pt prefers to treat via telemedicine empirically rather then risking or undertaking an in person visit at this moment. Patient agrees to seek prompt in person care if worsening, new symptoms arise, or if is not improving with treatment.   I discussed the assessment and treatment plan with the patient. The patient was provided an opportunity to ask questions and all were answered. The patient agreed with the plan and demonstrated an understanding of the instructions.   The patient was advised to call back or seek an in-person evaluation if the symptoms worsen or if the condition fails to improve as anticipated.  Time 20 min Delorise Depriest, MD

## 2020-04-28 ENCOUNTER — Ambulatory Visit: Payer: Medicare HMO | Admitting: Internal Medicine

## 2020-05-05 ENCOUNTER — Other Ambulatory Visit: Payer: Self-pay

## 2020-05-05 ENCOUNTER — Telehealth (INDEPENDENT_AMBULATORY_CARE_PROVIDER_SITE_OTHER): Payer: Medicare HMO | Admitting: Internal Medicine

## 2020-05-05 ENCOUNTER — Encounter: Payer: Self-pay | Admitting: Internal Medicine

## 2020-05-05 VITALS — Ht 70.0 in | Wt 220.0 lb

## 2020-05-05 DIAGNOSIS — S14105S Unspecified injury at C5 level of cervical spinal cord, sequela: Secondary | ICD-10-CM

## 2020-05-05 DIAGNOSIS — H409 Unspecified glaucoma: Secondary | ICD-10-CM

## 2020-05-05 DIAGNOSIS — G8921 Chronic pain due to trauma: Secondary | ICD-10-CM

## 2020-05-05 DIAGNOSIS — F488 Other specified nonpsychotic mental disorders: Secondary | ICD-10-CM

## 2020-05-05 DIAGNOSIS — I10 Essential (primary) hypertension: Secondary | ICD-10-CM

## 2020-05-05 DIAGNOSIS — H269 Unspecified cataract: Secondary | ICD-10-CM

## 2020-05-05 DIAGNOSIS — E669 Obesity, unspecified: Secondary | ICD-10-CM

## 2020-05-05 DIAGNOSIS — M5416 Radiculopathy, lumbar region: Secondary | ICD-10-CM

## 2020-05-05 DIAGNOSIS — R4189 Other symptoms and signs involving cognitive functions and awareness: Secondary | ICD-10-CM

## 2020-05-05 DIAGNOSIS — E66811 Obesity, class 1: Secondary | ICD-10-CM

## 2020-05-05 DIAGNOSIS — R748 Abnormal levels of other serum enzymes: Secondary | ICD-10-CM

## 2020-05-05 NOTE — Progress Notes (Signed)
Patient had steroid injections in to the back on the 7 th of this month. States he is now having arm, hand, and back pain.

## 2020-05-05 NOTE — Progress Notes (Signed)
Chief Complaint  Patient presents with  . Follow-up   F/u  1. Chronic back pain last week 04/28/20 he had steroid epidural in lower back and helped slightly with pain not had this in > 1 year and he thinks this helped with his leg sx's.  No med changes still on chronic pain meds with pain clinic in [place] but this is pain clinic through workers comp and he is thinking about transferring care to [place] b/c lives in [place] and this is 10 hour drive. Next appt 05/13/20 and he will ask them what to do. [facility] [place] or Dr. Hardin Negus [place]  2. HTN on losartan 50 mg qd not checked BP in 3 weeks and cant recall BP readings then will check BP at f/u lab visit  3. 02/27/20 ALT 71 elevated will repeat upcoming  4. Cataracts and glaucoma eye exam and pressure not of concern for now and cataracts mild  5. Brain fog s/p covid 19 02/17/20. He had to resch neurology appt to 06/21/20 due to getting lost could not find [facility] neurology with initial appt His close cousin also died of covid 25 around the time he was sick with covid  Review of Systems  Constitutional: Negative for weight loss.  HENT: Negative for hearing loss.   Eyes: Negative for blurred vision.  Respiratory: Negative for shortness of breath.   Cardiovascular: Negative for chest pain.  Musculoskeletal: Positive for back pain.       Improved back pain    Skin: Negative for rash.  Psychiatric/Behavioral: Positive for memory loss.   Past Medical History:  Diagnosis Date  . Asthma    dust  . Cancer ([place])    prostate; early 13s   . Chronic pain    f/u pain clinic in [place]; arms, neck, back-pain clinic in [place]  . COVID-19    02/17/20 hosp 5/3-02/27/20 covid pneumonia  . Glaucoma   . HLD (hyperlipidemia)   . Hypertension   . Spinal cord injury, C5-C7 ([place])    C6.C7 in 2008 injury at work he was paralyzed from waist down but walking again;workers comp   Past Surgical History:  Procedure Laterality Date  . COLONOSCOPY WITH PROPOFOL  N/A 08/18/2019   Procedure: COLONOSCOPY WITH PROPOFOL;  Surgeon: Lin Landsman, MD;  Location: [facility] ENDOSCOPY;  Service: Gastroenterology;  Laterality: N/A;  . LUMBAR LAMINECTOMY    . PROSTATECTOMY     had radiation no injections h/o prostate pump    Family History  Problem Relation Age of Onset  . Heart failure Mother   . Cancer Father        prostate  . Dementia Father   . Cancer Brother        prostate  . Pancreatic cancer Paternal Grandmother   . Prostate cancer Other   . Prostate cancer Brother        prostate  . Dementia Sister    Social History   Socioeconomic History  . Marital status: Married    Spouse name: Not on file  . Number of children: Not on file  . Years of education: Not on file  . Highest education level: Not on file  Occupational History  . Not on file  Tobacco Use  . Smoking status: Former Research scientist (life sciences)  . Smokeless tobacco: Never Used  Substance and Sexual Activity  . Alcohol use: Yes    Comment: Occasionally   . Drug use: Not on file  . Sexual activity: Not  on file  Other Topics Concern  . Not on file  Social History Narrative   Former smoker quit 38    Occasional etoh    Married with 2 sons and 1 daughter    From [place]   Retired    Social Determinants of Radio broadcast assistant Strain: [place]   . Difficulty of Paying Living Expenses: Not hard at all  Food Insecurity: No Food Insecurity  . Worried About Charity fundraiser in the Last Year: Never true  . Ran Out of Food in the Last Year: Never true  Transportation Needs: No Transportation Needs  . Lack of Transportation (Medical): No  . Lack of Transportation (Non-Medical): No  Physical Activity: Insufficiently Active  . Days of Exercise per Week: 3 days  . Minutes of Exercise per Session: 20 min  Stress: No Stress Concern Present  . Feeling of Stress : Not at all  Social Connections:   . Frequency of Communication with Friends and Family:   . Frequency of Social Gatherings  with Friends and Family:   . Attends Religious Services:   . Active Member of Clubs or Organizations:   . Attends Archivist Meetings:   Marland Kitchen Marital Status:   Intimate Partner Violence: Not At Risk  . Fear of Current or Ex-Partner: No  . Emotionally Abused: No  . Physically Abused: No  . Sexually Abused: No   Current Meds  Medication Sig  . albuterol (PROAIR HFA) 108 (90 Base) MCG/ACT inhaler Inhale 1-2 puffs into the lungs every 6 (six) hours as needed for wheezing or shortness of breath.  . Ascorbic Acid (VITAMIN C) 1000 MG tablet Take 1,000 mg by mouth daily.   Marland Kitchen atorvastatin (LIPITOR) 20 MG tablet Take 1 tablet (20 mg total) by mouth daily.  . B Complex Vitamins (VITAMIN B-COMPLEX) TABS Take 1 tablet by mouth daily in the afternoon.   . bimatoprost (LUMIGAN) 0.01 % SOLN Place 1 drop into both eyes at bedtime. 1 drop affected eye  . Cyanocobalamin (B-12) 500 MCG TABS Take 500 mcg by mouth daily.  Marland Kitchen DYMISTA 137-50 MCG/ACT SUSP Place 1 spray into both nostrils 2 (two) times daily as needed.  . gabapentin (NEURONTIN) 800 MG tablet Take 1 tablet (800 mg total) by mouth 3 (three) times daily.  Marland Kitchen losartan (COZAAR) 50 MG tablet Take 1 tablet (50 mg total) by mouth daily.  Marland Kitchen oxyCODONE-acetaminophen (PERCOCET) 10-325 MG tablet Take 1 tablet by mouth every 8 (eight) hours as needed for pain. Dr. Vira [facility] in [place]  . Polyethylene Glycol 400 (BLINK TEARS OP) Apply to eye.  . Quercetin 250 MG TABS Take 1 tablet (250 mg total) by mouth in the morning and at bedtime.  . Vitamin D, Ergocalciferol, 50 MCG (2000 UT) CAPS Take 4,000 Units by mouth daily.  . Zinc 100 MG TABS Take 1 tablet (100 mg total) by mouth daily.   Allergies  Allergen Reactions  . Percocet [Oxycodone-Acetaminophen]     Certain formulations itching     Recent Results (from the past 2160 hour(s))  CBC with Differential/Platelet     Status: Abnormal   Collection Time: 02/23/20  4:30 PM  Result  Value Ref Range   WBC 8.6 4.0 - 10.5 K/uL   RBC 3.77 (L) 4.22 - 5.81 MIL/uL   Hemoglobin 11.2 (L) 13.0 - 17.0 g/dL   HCT 33.2 (L) 39 - 52 %   MCV 88.1 80.0 - 100.0 fL  MCH 29.7 26.0 - 34.0 pg   MCHC 33.7 30.0 - 36.0 g/dL   RDW 12.2 11.5 - 15.5 %   Platelets 380 150 - 400 K/uL   nRBC 0.0 0.0 - 0.2 %   Neutrophils Relative % 79 %   Neutro Abs 6.8 1.7 - 7.7 K/uL   Lymphocytes Relative 10 %   Lymphs Abs 0.8 0.7 - 4.0 K/uL   Monocytes Relative 8 %   Monocytes Absolute 0.7 0 - 1 K/uL   Eosinophils Relative 1 %   Eosinophils Absolute 0.1 0 - 0 K/uL   Basophils Relative 0 %   Basophils Absolute 0.0 0 - 0 K/uL   WBC Morphology MORPHOLOGY UNREMARKABLE    RBC Morphology MORPHOLOGY UNREMARKABLE    Smear Review Normal platelet morphology    Immature Granulocytes 2 %   Abs Immature Granulocytes 0.19 (H) 0.00 - 0.07 K/uL    Comment: Performed at [facility], [address] [place], [place] [address]  Comprehensive metabolic panel     Status: Abnormal   Collection Time: 02/23/20  4:30 PM  Result Value Ref Range   Sodium 137 135 - 145 mmol/L   Potassium 3.3 (L) 3.5 - 5.1 mmol/L   Chloride 96 (L) 98 - 111 mmol/L   CO2 27 22 - 32 mmol/L   Glucose, Bld 126 (H) 70 - 99 mg/dL    Comment: Glucose reference range applies only to samples taken after fasting for at least 8 hours.   BUN 15 6 - 20 mg/dL   Creatinine, Ser 1.15 0.61 - 1.24 mg/dL   Calcium 8.6 (L) 8.9 - 10.3 mg/dL   Total Protein 7.1 6.5 - 8.1 g/dL   Albumin 3.3 (L) 3.5 - 5.0 g/dL   AST 70 (H) 15 - 41 U/L   ALT 115 (H) 0 - 44 U/L   Alkaline Phosphatase 60 38 - 126 U/L   Total Bilirubin 1.4 (H) 0.3 - 1.2 mg/dL   GFR calc non Af Amer >60 >60 mL/min   GFR calc Af Amer >60 >60 mL/min   Anion gap 14 5 - 15    Comment: Performed at [facility], [address] [place], [place] [address]  C-reactive protein     Status: Abnormal   Collection Time: 02/23/20  4:30 PM  Result Value Ref Range   CRP 12.3 (H) <1.0  mg/dL    Comment: Performed at [facility] [address], [place], [place] [address]  Ferritin     Status: Abnormal   Collection Time: 02/23/20  4:30 PM  Result Value Ref Range   Ferritin 552 (H) 24 - 336 ng/mL    Comment: Performed at [facility], [address] [place], [place] [address]  Lactate dehydrogenase     Status: Abnormal   Collection Time: 02/23/20  4:30 PM  Result Value Ref Range   LDH 306 (H) 98 - 192 U/L    Comment: Performed at [facility], [address] [place], [place] [address]  Fibrin derivatives D-[facility] only)     Status: Abnormal   Collection Time: 02/23/20  4:30 PM  Result Value Ref Range   Fibrin derivatives D-dimer ([facility]) 1,144.86 (H) 0.00 - 499.00 ng/mL (FEU)    Comment: (NOTE) <> Exclusion of Venous Thromboembolism (VTE) - OUTPATIENT ONLY   (Emergency Department or [place])   0-499 ng/ml (FEU): With a low to intermediate pretest probability  for VTE this test result excludes the diagnosis                      of VTE.   >499 ng/ml (FEU) : VTE not excluded; additional work up for VTE is                      required. <> Testing on Inpatients and Evaluation of Disseminated Intravascular   Coagulation (DIC) Reference Range:   0-499 ng/ml (FEU) Performed at [facility], [address] [place], [place] [address]   ABO/Rh     Status: None   Collection Time: 02/23/20  8:32 PM  Result Value Ref Range   ABO/RH(D)      O POS Performed at [facility], [address] [place], [place] [address]   CBC with Differential/Platelet     Status: Abnormal   Collection Time: 02/24/20  4:43 AM  Result Value Ref Range   WBC 8.8 4.0 - 10.5 K/uL   RBC 3.49 (L) 4.22 - 5.81 MIL/uL   Hemoglobin 10.5 (L) 13.0 - 17.0 g/dL   HCT 30.9 (L) 39 - 52 %   MCV 88.5 80.0 - 100.0 fL   MCH 30.1 26.0 - 34.0 pg   MCHC 34.0 30.0 - 36.0 g/dL   RDW 12.1 11.5 - 15.5 %   Platelets 395 150 - 400 K/uL   nRBC 0.0  0.0 - 0.2 %   Neutrophils Relative % 85 %   Neutro Abs 7.4 1.7 - 7.7 K/uL   Lymphocytes Relative 9 %   Lymphs Abs 0.8 0.7 - 4.0 K/uL   Monocytes Relative 4 %   Monocytes Absolute 0.4 0 - 1 K/uL   Eosinophils Relative 0 %   Eosinophils Absolute 0.0 0 - 0 K/uL   Basophils Relative 0 %   Basophils Absolute 0.0 0 - 0 K/uL   Immature Granulocytes 2 %   Abs Immature Granulocytes 0.21 (H) 0.00 - 0.07 K/uL    Comment: Performed at [facility], [address] [place], [place] [address]  C-reactive protein     Status: Abnormal   Collection Time: 02/24/20  4:43 AM  Result Value Ref Range   CRP 12.2 (H) <1.0 mg/dL    Comment: Performed at [facility], [address] [place], [place] [address]  Basic metabolic panel     Status: Abnormal   Collection Time: 02/24/20  4:43 AM  Result Value Ref Range   Sodium 134 (L) 135 - 145 mmol/L   Potassium 4.4 3.5 - 5.1 mmol/L   Chloride 99 98 - 111 mmol/L   CO2 30 22 - 32 mmol/L   Glucose, Bld 249 (H) 70 - 99 mg/dL    Comment: Glucose reference range applies only to samples taken after fasting for at least 8 hours.   BUN 17 6 - 20 mg/dL   Creatinine, Ser 1.02 0.61 - 1.24 mg/dL   Calcium 8.7 (L) 8.9 - 10.3 mg/dL   GFR calc non Af Amer >60 >60 mL/min   GFR calc Af Amer >60 >60 mL/min   Anion gap 5 5 - 15    Comment: Performed at [facility], [address], [place], [place] [address]  Magnesium     Status: Abnormal   Collection Time: 02/24/20  4:43 AM  Result Value Ref Range   Magnesium 2.8 (H) 1.7 - 2.4 mg/dL    Comment: Performed at [facility],  [place], [place] [address]  Hemoglobin A1c     Status: None   Collection Time: 02/24/20  4:43 AM  Result Value Ref Range   Hgb A1c MFr Bld 5.6 4.8 - 5.6 %    Comment: (NOTE) Pre diabetes:          5.7%-6.4% Diabetes:              >6.4% Glycemic control for   <7.0% adults with diabetes    Mean Plasma Glucose 114.02 mg/dL    Comment: Performed at  [facility] [address], [place], [place] [address]  Glucose, capillary     Status: Abnormal   Collection Time: 02/24/20 11:45 AM  Result Value Ref Range   Glucose-Capillary 179 (H) 70 - 99 mg/dL    Comment: Glucose reference range applies only to samples taken after fasting for at least 8 hours.  Glucose, capillary     Status: Abnormal   Collection Time: 02/24/20  4:44 PM  Result Value Ref Range   Glucose-Capillary 163 (H) 70 - 99 mg/dL    Comment: Glucose reference range applies only to samples taken after fasting for at least 8 hours.  Glucose, capillary     Status: Abnormal   Collection Time: 02/24/20  9:01 PM  Result Value Ref Range   Glucose-Capillary 132 (H) 70 - 99 mg/dL    Comment: Glucose reference range applies only to samples taken after fasting for at least 8 hours.  C-reactive protein     Status: Abnormal   Collection Time: 02/25/20  4:22 AM  Result Value Ref Range   CRP 7.6 (H) <1.0 mg/dL    Comment: Performed at [facility] [address], [place], [place] [address]  Basic metabolic panel     Status: Abnormal   Collection Time: 02/25/20  4:22 AM  Result Value Ref Range   Sodium 137 135 - 145 mmol/L   Potassium 3.9 3.5 - 5.1 mmol/L   Chloride 100 98 - 111 mmol/L   CO2 28 22 - 32 mmol/L   Glucose, Bld 156 (H) 70 - 99 mg/dL    Comment: Glucose reference range applies only to samples taken after fasting for at least 8 hours.   BUN 18 6 - 20 mg/dL   Creatinine, Ser 0.88 0.61 - 1.24 mg/dL   Calcium 8.5 (L) 8.9 - 10.3 mg/dL   GFR calc non Af Amer >60 >60 mL/min   GFR calc Af Amer >60 >60 mL/min   Anion gap 9 5 - 15    Comment: Performed at [facility], [address] [place], [place] [address]  Magnesium     Status: None   Collection Time: 02/25/20  4:22 AM  Result Value Ref Range   Magnesium 2.3 1.7 - 2.4 mg/dL    Comment: Performed at [facility], [address] [place], [place] [address]  CBC     Status: Abnormal    Collection Time: 02/25/20  4:22 AM  Result Value Ref Range   WBC 13.4 (H) 4.0 - 10.5 K/uL   RBC 3.50 (L) 4.22 - 5.81 MIL/uL   Hemoglobin 10.6 (L) 13.0 - 17.0 g/dL   HCT 31.4 (L) 39 - 52 %   MCV 89.7 80.0 - 100.0 fL   MCH 30.3 26.0 - 34.0 pg   MCHC 33.8 30.0 - 36.0 g/dL   RDW 12.3 11.5 - 15.5 %   Platelets 422 (H) 150 - 400 K/uL  nRBC 0.0 0.0 - 0.2 %    Comment: Performed at [facility], [address] [place], [place] [address]  Glucose, capillary     Status: Abnormal   Collection Time: 02/25/20  8:27 AM  Result Value Ref Range   Glucose-Capillary 132 (H) 70 - 99 mg/dL    Comment: Glucose reference range applies only to samples taken after fasting for at least 8 hours.  Glucose, capillary     Status: Abnormal   Collection Time: 02/25/20 11:59 AM  Result Value Ref Range   Glucose-Capillary 243 (H) 70 - 99 mg/dL    Comment: Glucose reference range applies only to samples taken after fasting for at least 8 hours.  Glucose, capillary     Status: Abnormal   Collection Time: 02/25/20  4:42 PM  Result Value Ref Range   Glucose-Capillary 112 (H) 70 - 99 mg/dL    Comment: Glucose reference range applies only to samples taken after fasting for at least 8 hours.  Glucose, capillary     Status: Abnormal   Collection Time: 02/25/20  8:53 PM  Result Value Ref Range   Glucose-Capillary 157 (H) 70 - 99 mg/dL    Comment: Glucose reference range applies only to samples taken after fasting for at least 8 hours.  C-reactive protein     Status: Abnormal   Collection Time: 02/26/20  7:12 AM  Result Value Ref Range   CRP 3.7 (H) <1.0 mg/dL    Comment: Performed at [facility] [address], [place], [place] [address]  Magnesium     Status: None   Collection Time: 02/26/20  7:12 AM  Result Value Ref Range   Magnesium 2.2 1.7 - 2.4 mg/dL    Comment: Performed at [facility], [address] [place], [place] [address]  CBC     Status: Abnormal   Collection Time:  02/26/20  7:12 AM  Result Value Ref Range   WBC 10.6 (H) 4.0 - 10.5 K/uL   RBC 3.88 (L) 4.22 - 5.81 MIL/uL   Hemoglobin 11.7 (L) 13.0 - 17.0 g/dL   HCT 34.5 (L) 39 - 52 %   MCV 88.9 80.0 - 100.0 fL   MCH 30.2 26.0 - 34.0 pg   MCHC 33.9 30.0 - 36.0 g/dL   RDW 12.4 11.5 - 15.5 %   Platelets 544 (H) 150 - 400 K/uL   nRBC 0.0 0.0 - 0.2 %    Comment: Performed at [facility], [address] [place], [place] [address]  Comprehensive metabolic panel     Status: Abnormal   Collection Time: 02/26/20  7:12 AM  Result Value Ref Range   Sodium 138 135 - 145 mmol/L   Potassium 4.2 3.5 - 5.1 mmol/L   Chloride 101 98 - 111 mmol/L   CO2 30 22 - 32 mmol/L   Glucose, Bld 151 (H) 70 - 99 mg/dL    Comment: Glucose reference range applies only to samples taken after fasting for at least 8 hours.   BUN 15 6 - 20 mg/dL   Creatinine, Ser 0.89 0.61 - 1.24 mg/dL   Calcium 8.2 (L) 8.9 - 10.3 mg/dL   Total Protein 6.7 6.5 - 8.1 g/dL   Albumin 3.0 (L) 3.5 - 5.0 g/dL   AST 35 15 - 41 U/L   ALT 84 (H) 0 - 44 U/L   Alkaline Phosphatase 60 38 - 126 U/L   Total Bilirubin 0.7 0.3 - 1.2 mg/dL  GFR calc non Af Amer >60 >60 mL/min   GFR calc Af Amer >60 >60 mL/min   Anion gap 7 5 - 15    Comment: Performed at [facility], [address] [place], [place] [address]  Glucose, capillary     Status: Abnormal   Collection Time: 02/26/20  8:19 AM  Result Value Ref Range   Glucose-Capillary 119 (H) 70 - 99 mg/dL    Comment: Glucose reference range applies only to samples taken after fasting for at least 8 hours.  Glucose, capillary     Status: Abnormal   Collection Time: 02/26/20 11:55 AM  Result Value Ref Range   Glucose-Capillary 151 (H) 70 - 99 mg/dL    Comment: Glucose reference range applies only to samples taken after fasting for at least 8 hours.  Glucose, capillary     Status: Abnormal   Collection Time: 02/26/20  5:03 PM  Result Value Ref Range   Glucose-Capillary 118 (H) 70 - 99  mg/dL    Comment: Glucose reference range applies only to samples taken after fasting for at least 8 hours.  Glucose, capillary     Status: None   Collection Time: 02/26/20  8:14 PM  Result Value Ref Range   Glucose-Capillary 90 70 - 99 mg/dL    Comment: Glucose reference range applies only to samples taken after fasting for at least 8 hours.  C-reactive protein     Status: Abnormal   Collection Time: 02/27/20  6:23 AM  Result Value Ref Range   CRP 2.4 (H) <1.0 mg/dL    Comment: Performed at [facility] [address] [place], [place] [address]  CBC     Status: Abnormal   Collection Time: 02/27/20  6:23 AM  Result Value Ref Range   WBC 9.4 4.0 - 10.5 K/uL   RBC 3.80 (L) 4.22 - 5.81 MIL/uL   Hemoglobin 11.5 (L) 13.0 - 17.0 g/dL   HCT 34.7 (L) 39 - 52 %   MCV 91.3 80.0 - 100.0 fL   MCH 30.3 26.0 - 34.0 pg   MCHC 33.1 30.0 - 36.0 g/dL   RDW 12.4 11.5 - 15.5 %   Platelets 570 (H) 150 - 400 K/uL   nRBC 0.0 0.0 - 0.2 %    Comment: Performed at [facility], [address] [place], [place] [address]  Comprehensive metabolic panel     Status: Abnormal   Collection Time: 02/27/20  6:23 AM  Result Value Ref Range   Sodium 139 135 - 145 mmol/L   Potassium 4.3 3.5 - 5.1 mmol/L   Chloride 104 98 - 111 mmol/L   CO2 29 22 - 32 mmol/L   Glucose, Bld 150 (H) 70 - 99 mg/dL    Comment: Glucose reference range applies only to samples taken after fasting for at least 8 hours.   BUN 16 6 - 20 mg/dL   Creatinine, Ser 0.85 0.61 - 1.24 mg/dL   Calcium 8.4 (L) 8.9 - 10.3 mg/dL   Total Protein 6.5 6.5 - 8.1 g/dL   Albumin 3.0 (L) 3.5 - 5.0 g/dL   AST 27 15 - 41 U/L   ALT 71 (H) 0 - 44 U/L   Alkaline Phosphatase 60 38 - 126 U/L   Total Bilirubin 0.6 0.3 - 1.2 mg/dL   GFR calc non Af Amer >60 >60 mL/min   GFR calc Af Amer >60 >60 mL/min   Anion gap 6 5 - 15  Comment: Performed at [facility], [address] [place], [place] [address]  Glucose, capillary      Status: Abnormal   Collection Time: 02/27/20  8:21 AM  Result Value Ref Range   Glucose-Capillary 129 (H) 70 - 99 mg/dL    Comment: Glucose reference range applies only to samples taken after fasting for at least 8 hours.  Glucose, capillary     Status: Abnormal   Collection Time: 02/27/20 11:51 AM  Result Value Ref Range   Glucose-Capillary 159 (H) 70 - 99 mg/dL    Comment: Glucose reference range applies only to samples taken after fasting for at least 8 hours.   Objective  Body mass index is 31.57 kg/m. Wt Readings from Last 3 Encounters:  05/05/20 220 lb (99.8 kg)  03/09/20 210 lb (95.3 kg)  02/23/20 212 lb 6.4 oz (96.3 kg)   Temp Readings from Last 3 Encounters:  02/27/20 98.6 F (37 C)  08/18/19 (!) 96.9 F (36.1 C) (Tympanic)   BP Readings from Last 3 Encounters:  03/09/20 138/82  02/27/20 125/84  08/18/19 (!) 124/91   Pulse Readings from Last 3 Encounters:  03/09/20 84  02/27/20 88  08/18/19 76    Physical Exam Vitals and nursing note reviewed.  Constitutional:      Appearance: Normal appearance. He is well-developed and well-groomed. He is obese.  HENT:     Head: Normocephalic and atraumatic.  Eyes:     Conjunctiva/sclera: Conjunctivae normal.     Pupils: Pupils are equal, round, and reactive to light.  Cardiovascular:     Rate and Rhythm: Normal rate and regular rhythm.     Heart sounds: Normal heart sounds. No murmur heard.   Pulmonary:     Effort: Pulmonary effort is normal.     Breath sounds: Normal breath sounds.  Skin:    General: Skin is warm and dry.  Neurological:     General: No focal deficit present.     Mental Status: He is alert and oriented to person, place, and time. Mental status is at baseline.     Gait: Gait normal.  Psychiatric:        Attention and Perception: Attention and perception normal.        Mood and Affect: Mood and affect normal.        Speech: Speech normal.        Behavior: Behavior normal. Behavior is cooperative.         Thought Content: Thought content normal.        Cognition and Memory: Cognition and memory normal.        Judgment: Judgment normal.     Assessment  Plan  Essential hypertension BP check with upcoming labs   Cataract, unspecified cataract type, unspecified laterality Glaucoma, unspecified glaucoma type, unspecified laterality F/u with eye md   C5-C7 level spinal cord injury, sequela ([place]) Lumbar radiculopathy S/p epidural inj 04/28/20  Pt thinking about switching pain clinic [place] to [place] [facility] in [place], [facility] vs Dr. Hardin Negus   Elevated liver enzymes Check lfts repeat  Brain fog Neuro appt [facility] neuro sch 06/21/20   Obesity (BMI 30.0-34.9) rec healthy diet and exercise    HM Flu shot utd  prevnar utd pna 23in future 1 year 11/10/20  [employer] 01/31/20 will need 2nd dose in 05/29/20  Tdap as well and shingrixsent Rx today  Colonoscopy10/26/20 tubular adenoma f/u in 3 years  UndetectablePSA9/9/2020h/o prostate cancer with h/o removal and radiation no  injections Former smoker quit in 81s  Occasional etoh   Given names of eye MD and dentists in the area  Former PCP Dr. Genia Plants [place] in [place] [phone]  [place]  EGD 09/25/10 esophagitis duodenitis mildly enlarged major papilla path neg h pylori chronic peptic duodentitis c/w adenomatous change; GERD   EGD path 06/05/12 gastric antrum bx negative duodenum negative no H pylori  EGD 01/23/15 mild erythema in stomach   EGD 01/24/17-see report scanned in path 01/24/17 gastric antrum bx and duodenum bx negative no H pylori  Upper EUS 03/08/11 see report scanned in chart Upper EUS 01/23/15 see report scanned inchart Upper EUS 01/24/17 GB sludge, no gallstones, bile duct normal, hyperechoic foci and strands in pancreas and lobularity but no parenchymal calcifications/pancreatic ductal dilatation noted, 2 small cysts in pancreas body 4-5 mm liver with 6 mm cyst no  lymphadenopathy  Colonoscopy 08/02/13 hyperplastic polyp sigmoid colon and mild hyperplastic changes rectum   Colonoscopy 07/27/10 semi pedunculated polyp splenic flexure IH/EH  Colonoscopy 07/31/08 pedunculated polyp transverse colon few diminutive sessile polyps rectum diverticulosis IH/EH   Provider: Dr. Olivia Mackie [facility]

## 2020-06-04 ENCOUNTER — Other Ambulatory Visit: Payer: Self-pay

## 2020-06-04 ENCOUNTER — Other Ambulatory Visit (INDEPENDENT_AMBULATORY_CARE_PROVIDER_SITE_OTHER): Payer: Medicare HMO

## 2020-06-04 ENCOUNTER — Telehealth: Payer: Self-pay | Admitting: Internal Medicine

## 2020-06-04 DIAGNOSIS — R748 Abnormal levels of other serum enzymes: Secondary | ICD-10-CM

## 2020-06-04 LAB — HEPATIC FUNCTION PANEL
ALT: 20 U/L (ref 0–53)
AST: 17 U/L (ref 0–37)
Albumin: 4.4 g/dL (ref 3.5–5.2)
Alkaline Phosphatase: 77 U/L (ref 39–117)
Bilirubin, Direct: 0.2 mg/dL (ref 0.0–0.3)
Total Bilirubin: 1 mg/dL (ref 0.2–1.2)
Total Protein: 6.6 g/dL (ref 6.0–8.3)

## 2020-06-04 NOTE — Telephone Encounter (Signed)
Pt states that he is due for a shot but not sure which one. He thinks possibly pneumonia or shingles? Please advise

## 2020-06-04 NOTE — Telephone Encounter (Signed)
Left message to return call.  According to [facility] Mc[facility]ocuzza last note: HM Flu shot utd  prevnar utd pna 23 in future 1 year 11/10/20  [place] 01/31/20 will need 2nd dose in 05/29/20  Tdap as well and shingrix sent Rx today

## 2020-06-04 NOTE — Telephone Encounter (Signed)
Called and spoke to the patient while giving lab results. Matthew Turner asked about which shots he needs to be up to date. Informed him of the needed shots according to [facility] McLean-Scocuzzas Notes. Matthew Turner Verbalized understanding and had no further questions .

## 2020-06-22 ENCOUNTER — Other Ambulatory Visit: Payer: Self-pay | Admitting: Neurology

## 2020-06-22 ENCOUNTER — Other Ambulatory Visit (HOSPITAL_COMMUNITY): Payer: Self-pay | Admitting: Neurology

## 2020-06-22 DIAGNOSIS — R4189 Other symptoms and signs involving cognitive functions and awareness: Secondary | ICD-10-CM

## 2020-06-25 ENCOUNTER — Telehealth: Payer: Self-pay | Admitting: Internal Medicine

## 2020-06-25 NOTE — Telephone Encounter (Signed)
Patient called to get a referral for a pain management doctor.

## 2020-06-27 ENCOUNTER — Other Ambulatory Visit: Payer: Self-pay

## 2020-06-27 ENCOUNTER — Ambulatory Visit (HOSPITAL_COMMUNITY)
Admission: RE | Admit: 2020-06-27 | Discharge: 2020-06-27 | Disposition: A | Discharge status: Home / Self Care | Payer: Medicare HMO | Source: Ambulatory Visit | Attending: Neurology | Admitting: Neurology

## 2020-06-27 DIAGNOSIS — R4189 Other symptoms and signs involving cognitive functions and awareness: Secondary | ICD-10-CM | POA: Diagnosis not present

## 2020-06-29 NOTE — Telephone Encounter (Signed)
According to last note: "1. Chronic back pain last week 04/28/20 he had steroid epidural in lower back and helped slightly with pain not had this in > 1 year and he thinks this helped with his leg sx's.  No med changes still on chronic pain meds with pain clinic in [place] but this is pain clinic through workers comp and he is thinking about transferring care to [place] b/c lives in [place] and this is 10 [place] drive. Next appt 05/13/20 and he will ask them what to do. [facility] in [place] or Dr. Hardin Negus in [facility] with patient and he lives in [place]. He would like to see someone closer so he will go with [facility].

## 2020-07-12 NOTE — Addendum Note (Signed)
Addended by: Orland Mustard on: 07/12/2020 09:40 PM   Modules accepted: Orders

## 2020-07-12 NOTE — Telephone Encounter (Signed)
I think [facility] in [place] will be better fit for him referred to Dr. Foy Guadalajara urgently They will call with appt

## 2020-07-13 NOTE — Telephone Encounter (Signed)
Patient informed and verbalized understanding

## 2020-07-19 NOTE — Telephone Encounter (Signed)
Patient calling in as he has not heard back from the referral. Informed him that this has been sent through on our end as of 07/13/20 and gave the Patient the number to call and be scheduled.  [phone]

## 2020-09-22 ENCOUNTER — Ambulatory Visit (INDEPENDENT_AMBULATORY_CARE_PROVIDER_SITE_OTHER): Payer: Medicare HMO

## 2020-09-22 VITALS — Ht 70.0 in | Wt 220.0 lb

## 2020-09-22 DIAGNOSIS — Z0000 Encounter for general adult medical examination without abnormal findings: Secondary | ICD-10-CM

## 2020-09-22 NOTE — Patient Instructions (Addendum)
Matthew Turner , Thank you for taking time to come for your Medicare Wellness Visit. I appreciate your ongoing commitment to your health goals. Please review the following plan we discussed and let me know if I can assist you in the future.   These are the goals we discussed: Goals      Patient Stated   .  Follow up with Primary Care Provider (pt-stated)      Schedule eye and dental exam Drink less soda       This is a list of the screening recommended for you and due dates:  Health Maintenance  Topic Date Due  . Flu Shot  01/20/2021*  . Tetanus Vaccine  09/22/2021*  . Colon Cancer Screening  08/17/2022  . COVID-19 Vaccine  Completed  .  Hepatitis C: One time screening is recommended by Center for Disease Control  (CDC) for  adults born from [birth year] through 1967.   Completed  . HIV Screening  Completed  *Topic was postponed. The date shown is not the original due date.    Immunizations Immunization History  Administered Date(s) Administered  . Influenza Inj Mdck Quad Pf 06/26/2019  . PFIZER SARS-COV-2 Vaccination 01/31/2020, 05/29/2020  . Pneumococcal Conjugate-13 11/11/2019   Follow up 10/07/20 @ 8:00  Advanced directives: not yet completed.  Conditions/risks identified: none new.  Follow up in one year for your annual wellness visit.  Preventive Care 40-64 Years, Male Preventive care refers to lifestyle choices and visits with your health care provider that can promote health and wellness. What does preventive care include?  A yearly physical exam. This is also called an annual well check.  Dental exams once or twice a year.  Routine eye exams. Ask your health care provider how often you should have your eyes checked.  Personal lifestyle choices, including:  Daily care of your teeth and gums.  Regular physical activity.  Eating a healthy diet.  Avoiding tobacco and drug use.  Limiting alcohol use.  Practicing safe sex.  Taking low-dose aspirin every day  starting at age 74. What happens during an annual well check? The services and screenings done by your health care provider during your annual well check will depend on your age, overall health, lifestyle risk factors, and family history of disease. Counseling  Your health care provider may ask you questions about your:  Alcohol use.  Tobacco use.  Drug use.  Emotional well-being.  Home and relationship well-being.  Sexual activity.  Eating habits.  Work and work Statistician. Screening  You may have the following tests or measurements:  Height, weight, and BMI.  Blood pressure.  Lipid and cholesterol levels. These may be checked every 5 years, or more frequently if you are over 31 years old.  Skin check.  Lung cancer screening. You may have this screening every year starting at age 54 if you have a 30-pack-year history of smoking and currently smoke or have quit within the past 15 years.  Fecal occult blood test (FOBT) of the stool. You may have this test every year starting at age 57.  Flexible sigmoidoscopy or colonoscopy. You may have a sigmoidoscopy every 5 years or a colonoscopy every 10 years starting at age 17.  Prostate cancer screening. Recommendations will vary depending on your family history and other risks.  Hepatitis C blood test.  Hepatitis B blood test.  Sexually transmitted disease (STD) testing.  Diabetes screening. This is done by checking your blood sugar (glucose) after you have not eaten  for a while (fasting). You may have this done every 1-3 years. Discuss your test results, treatment options, and if necessary, the need for more tests with your health care provider. Vaccines  Your health care provider may recommend certain vaccines, such as:  Influenza vaccine. This is recommended every year.  Tetanus, diphtheria, and acellular pertussis (Tdap, Td) vaccine. You may need a Td booster every 10 years.  Zoster vaccine. You may need this after  age 27.  Pneumococcal 13-valent conjugate (PCV13) vaccine. You may need this if you have certain conditions and have not been vaccinated.  Pneumococcal polysaccharide (PPSV23) vaccine. You may need one or two doses if you smoke cigarettes or if you have certain conditions. Talk to your health care provider about which screenings and vaccines you need and how often you need them. This information is not intended to replace advice given to you by your health care provider. Make sure you discuss any questions you have with your health care provider. Document Released: 11/05/2015 Document Revised: 06/28/2016 Document Reviewed: 08/10/2015 Elsevier Interactive Patient Education  2017 [place] Prevention in the Home Falls can cause injuries. They can happen to people of all ages. There are many things you can do to make your home safe and to help prevent falls. What can I do on the outside of my home?  Regularly fix the edges of walkways and driveways and fix any cracks.  Remove anything that might make you trip as you walk through a door, such as a raised step or threshold.  Trim any bushes or trees on the path to your home.  Use bright outdoor lighting.  Clear any walking paths of anything that might make someone trip, such as rocks or tools.  Regularly check to see if handrails are loose or broken. Make sure that both sides of any steps have handrails.  Any raised decks and porches should have guardrails on the edges.  Have any leaves, snow, or ice cleared regularly.  Use sand or salt on walking paths during winter.  Clean up any spills in your garage right away. This includes oil or grease spills. What can I do in the bathroom?  Use night lights.  Install grab bars by the toilet and in the tub and shower. Do not use towel bars as grab bars.  Use non-skid mats or decals in the tub or shower.  If you need to sit down in the shower, use a plastic, non-slip  stool.  Keep the floor dry. Clean up any water that spills on the floor as soon as it happens.  Remove soap buildup in the tub or shower regularly.  Attach bath mats securely with double-sided non-slip rug tape.  Do not have throw rugs and other things on the floor that can make you trip. What can I do in the bedroom?  Use night lights.  Make sure that you have a light by your bed that is easy to reach.  Do not use any sheets or blankets that are too big for your bed. They should not hang down onto the floor.  Have a firm chair that has side arms. You can use this for support while you get dressed.  Do not have throw rugs and other things on the floor that can make you trip. What can I do in the kitchen?  Clean up any spills right away.  Avoid walking on wet floors.  Keep items that you use a lot in easy-to-reach places.  If you need to reach something above you, use a strong step stool that has a grab bar.  Keep electrical cords out of the way.  Do not use floor polish or wax that makes floors slippery. If you must use wax, use non-skid floor wax.  Do not have throw rugs and other things on the floor that can make you trip. What can I do with my stairs?  Do not leave any items on the stairs.  Make sure that there are handrails on both sides of the stairs and use them. Fix handrails that are broken or loose. Make sure that handrails are as long as the stairways.  Check any carpeting to make sure that it is firmly attached to the stairs. Fix any carpet that is loose or worn.  Avoid having throw rugs at the top or bottom of the stairs. If you do have throw rugs, attach them to the floor with carpet tape.  Make sure that you have a light switch at the top of the stairs and the bottom of the stairs. If you do not have them, ask someone to add them for you. What else can I do to help prevent falls?  Wear shoes that:  Do not have high heels.  Have rubber bottoms.  Are  comfortable and fit you well.  Are closed at the toe. Do not wear sandals.  If you use a stepladder:  Make sure that it is fully opened. Do not climb a closed stepladder.  Make sure that both sides of the stepladder are locked into place.  Ask someone to hold it for you, if possible.  Clearly mark and make sure that you can see:  Any grab bars or handrails.  First and last steps.  Where the edge of each step is.  Use tools that help you move around (mobility aids) if they are needed. These include:  Canes.  Walkers.  Scooters.  Crutches.  Turn on the lights when you go into a dark area. Replace any light bulbs as soon as they burn out.  Set up your furniture so you have a clear path. Avoid moving your furniture around.  If any of your floors are uneven, fix them.  If there are any pets around you, be aware of where they are.  Review your medicines with your doctor. Some medicines can make you feel dizzy. This can increase your chance of falling. Ask your doctor what other things that you can do to help prevent falls. This information is not intended to replace advice given to you by your health care provider. Make sure you discuss any questions you have with your health care provider. Document Released: 08/05/2009 Document Revised: 03/16/2016 Document Reviewed: 11/13/2014 Elsevier Interactive Patient Education  2017 [employer].

## 2020-09-22 NOTE — Progress Notes (Signed)
Subjective:   Matthew Turner is a 59 y.o. male who presents for Medicare Annual/Subsequent preventive examination.  Review of Systems    No ROS.  Medicare Wellness Virtual Visit.    Cardiac Risk Factors include: advanced age (>37men, >11 women);male gender;hypertension     Objective:    Today's Vitals   09/22/20 0855  Weight: 220 lb (99.8 kg)  Height: 5\' 10"  (1.778 m)   Body mass index is 31.57 kg/m.  Advanced Directives 09/22/2020 02/23/2020 02/23/2020 09/22/2019 08/18/2019  Does Patient Have a Medical Advance Directive? No No No No No  Would patient like information on creating a medical advance directive? No - Patient declined No - Patient declined - Yes (MAU/Ambulatory/Procedural Areas - Information given) -    Current Medications (verified) Outpatient Encounter Medications as of 09/22/2020  Medication Sig  . albuterol (PROAIR HFA) 108 (90 Base) MCG/ACT inhaler Inhale 1-2 puffs into the lungs every 6 (six) hours as needed for wheezing or shortness of breath.  . Ascorbic Acid (VITAMIN C) 1000 MG tablet Take 1,000 mg by mouth daily.   Marland Kitchen atorvastatin (LIPITOR) 20 MG tablet Take 1 tablet (20 mg total) by mouth daily.  . B Complex Vitamins (VITAMIN B-COMPLEX) TABS Take 1 tablet by mouth daily in the afternoon.   . bimatoprost (LUMIGAN) 0.01 % SOLN Place 1 drop into both eyes at bedtime. 1 drop affected eye  . Cyanocobalamin (B-12) 500 MCG TABS Take 500 mcg by mouth daily.  Marland Kitchen DYMISTA 137-50 MCG/ACT SUSP Place 1 spray into both nostrils 2 (two) times daily as needed.  . gabapentin (NEURONTIN) 800 MG tablet Take 1 tablet (800 mg total) by mouth 3 (three) times daily.  Marland Kitchen losartan (COZAAR) 50 MG tablet Take 1 tablet (50 mg total) by mouth daily.  Marland Kitchen oxyCODONE-acetaminophen (PERCOCET) 10-325 MG tablet Take 1 tablet by mouth every 8 (eight) hours as needed for pain. Dr. Vira [facility] [place]  . Polyethylene Glycol 400 (BLINK TEARS OP) Apply to eye.  . Quercetin 250  MG TABS Take 1 tablet (250 mg total) by mouth in the morning and at bedtime.  . Vitamin D, Ergocalciferol, 50 MCG (2000 UT) CAPS Take 4,000 Units by mouth daily.  . Zinc 100 MG TABS Take 1 tablet (100 mg total) by mouth daily.   No facility-administered encounter medications on file as of 09/22/2020.    Allergies (verified) Percocet [oxycodone-acetaminophen]   History: Past Medical History:  Diagnosis Date  . Asthma    dust  . Cancer ([place])    prostate; early 67s   . Chronic pain    f/u pain clinic in [place]; arms, neck, back-pain clinic in [place]  . COVID-19    02/17/20 hosp 5/3-02/27/20 covid pneumonia  . Glaucoma   . HLD (hyperlipidemia)   . Hypertension   . Spinal cord injury, C5-C7 ([place])    C6.C7 in 2008 injury at work he was paralyzed from waist down but walking again;workers comp   Past Surgical History:  Procedure Laterality Date  . COLONOSCOPY WITH PROPOFOL N/A 08/18/2019   Procedure: COLONOSCOPY WITH PROPOFOL;  Surgeon: Lin Landsman, MD;  Location: [facility] ENDOSCOPY;  Service: Gastroenterology;  Laterality: N/A;  . LUMBAR LAMINECTOMY    . PROSTATECTOMY     had radiation no injections h/o prostate pump    Family History  Problem Relation Age of Onset  . Heart failure Mother   . Cancer Father        prostate  .  Dementia Father   . Cancer Brother        prostate  . Prostate cancer Brother   . Pancreatic cancer Paternal Grandmother   . Prostate cancer Other   . Prostate cancer Brother        prostate  . Dementia Sister    Social History   Socioeconomic History  . Marital status: Married    Spouse name: Not on file  . Number of children: Not on file  . Years of education: Not on file  . Highest education level: Not on file  Occupational History  . Not on file  Tobacco Use  . Smoking status: Former Research scientist (life sciences)  . Smokeless tobacco: Never Used  Substance and Sexual Activity  . Alcohol use: Yes    Comment: Occasionally   . Drug use: Not on file  . Sexual  activity: Not on file  Other Topics Concern  . Not on file  Social History Narrative   Former smoker quit 38    Occasional etoh    Married with 2 sons and 1 daughter    From [place]   Retired    Social Determinants of Radio broadcast assistant Strain: [place]   . Difficulty of Paying Living Expenses: Not hard at all  Food Insecurity: No Food Insecurity  . Worried About Charity fundraiser in the Last Year: Never true  . Ran Out of Food in the Last Year: Never true  Transportation Needs: No Transportation Needs  . Lack of Transportation (Medical): No  . Lack of Transportation (Non-Medical): No  Physical Activity:   . Days of Exercise per Week: Not on file  . Minutes of Exercise per Session: Not on file  Stress: No Stress Concern Present  . Feeling of Stress : Not at all  Social Connections: Unknown  . Frequency of Communication with Friends and Family: Not on file  . Frequency of Social Gatherings with Friends and Family: Not on file  . Attends Religious Services: Not on file  . Active Member of Clubs or Organizations: Not on file  . Attends Archivist Meetings: Not on file  . Marital Status: Married    Tobacco Counseling Counseling given: Not Answered   Clinical Intake:  Pre-visit preparation completed: Yes        Diabetes: No  How often do you need to have someone help you when you read instructions, pamphlets, or other written materials from your doctor or pharmacy?: 1 - Never   Interpreter Needed?: No      Activities of Daily Living In your present state of health, do you have any difficulty performing the following activities: 09/22/2020 02/23/2020  Hearing? N N  Vision? N N  Difficulty concentrating or making decisions? N N  Walking or climbing stairs? N N  Comment Paces self -  Dressing or bathing? N N  Doing errands, shopping? N N  Preparing Food and eating ? N -  Using the Toilet? N -  In the past six months, have you accidently leaked  urine? Y -  Comment Leaks at night. Managed with pad. -  Do you have problems with loss of bowel control? N -  Managing your Medications? N -  Managing your Finances? N -  Housekeeping or managing your Housekeeping? N -  Some recent data might be hidden    Patient Care Team: McLean-Scocuzza, Nino Glow, MD as PCP - General (Internal Medicine)  Indicate any recent Medical Services you may have  received from other than [facility] providers in the past year (date may be approximate).     Assessment:   This is a routine wellness examination for [place].  I connected with Livan today by telephone and verified that I am speaking with the correct person using two identifiers. Location patient: home Location provider: work Persons participating in the virtual visit: patient, Marine scientist.    I discussed the limitations, risks, security and privacy concerns of performing an evaluation and management service by telephone and the availability of in person appointments.  The patient expressed understanding and verbally consented to this telephonic visit.    Interactive audio and video telecommunications were attempted between this provider and patient, however failed, due to patient having technical difficulties OR patient did not have access to video capability.  We continued and completed visit with audio only.  Some vital signs may be absent or patient reported.   Hearing/Vision screen  Hearing Screening   125Hz  250Hz  500Hz  1000Hz  2000Hz  3000Hz  4000Hz  6000Hz  8000Hz   Right ear:           Left ear:           Comments: Patient is able to hear conversational tones without difficulty. No issues reported.  Vision Screening Comments: Followed by [facility] Wears corrective lenses Visual acuity not assessed, virtual visit.  They have seen their ophthalmologist in the last 6 months.    Dietary issues and exercise activities discussed: Current Exercise Habits: Home exercise routine, Intensity: Mild    Regular diet Good water intake  Physical activity- active with yard work. Paces self.   Goals      Patient Stated   .  Follow up with Primary Care Provider (pt-stated)      Schedule eye and dental exam Drink less soda      Depression Screen PHQ 2/9 Scores 09/22/2020 05/05/2020 03/09/2020 09/22/2019  PHQ - 2 Score 0 0 1 0    Fall Risk Fall Risk  09/22/2020 05/05/2020 03/09/2020 02/16/2020 09/22/2019  Falls in the past year? 0 0 0 0 0  Number falls in past yr: 0 0 0 - -  Injury with Fall? 0 0 0 - -  Follow up Falls evaluation completed Falls evaluation completed Falls evaluation completed Falls evaluation completed Falls prevention discussed;Education provided   Handrails in use when climbing stairs? Yes Home free of loose throw rugs in walkways, pet beds, electrical cords, etc? Yes  Adequate lighting in your home to reduce risk of falls? Yes   ASSISTIVE DEVICES UTILIZED TO PREVENT FALLS: Use of a cane, walker or w/c? No   TIMED UP AND GO: Was the test performed? No . Virtual visit.   Cognitive Function:  Followed by Neurology.    6CIT Screen 09/22/2019  What Year? 0 points  What month? 0 points  What time? 0 points  Count back from 20 0 points  Months in reverse 0 points  Repeat phrase 0 points  Total Score 0   Immunizations Immunization History  Administered Date(s) Administered  . Influenza Inj Mdck Quad Pf 06/26/2019  . PFIZER SARS-COV-2 Vaccination 01/31/2020, 05/29/2020  . Pneumococcal Conjugate-13 11/11/2019   TDAP status: Due, Education has been provided regarding the importance of this vaccine. Advised may receive this vaccine at local pharmacy or Health Dept. Aware to provide a copy of the vaccination record if obtained from local pharmacy or Health Dept. Verbalized acceptance and understanding. Deferred.   Health Maintenance Health Maintenance  Topic Date Due  . INFLUENZA  VACCINE  01/20/2021 (Originally 05/23/2020)  . TETANUS/TDAP  09/22/2021 (Originally  05[birth year]04/1978)  . COLONOSCOPY  08/17/2022  . COVID-19 Vaccine  Completed  . Hepatitis C Screening  Completed  . HIV Screening  Completed   Colorectal cancer screening: Completed 08/18/19. Repeat every 3 years  Lung Cancer Screening: (Low Dose CT Chest recommended if Age 55-80 years, 30 pack-year currently smoking OR have quit w/in 15years.) does not qualify.   Hepatitis C Screening: Completed 07/02/19.  Vision Screening: Recommended annual ophthalmology exams for early detection of glaucoma and other disorders of the eye. Is the patient up to date with their annual eye exam?  Yes  Who is the provider or what is the name of the office in which the patient attends annual eye exams? [facility]. Glaucoma. Visits every 6 months.   Dental Screening: Recommended annual dental exams for proper oral hygiene.  Community Resource Referral / Chronic Care Management: CRR required this visit?  No   CCM required this visit?  No      Plan:   Keep all routine maintenance appointments.   Follow up 10/07/20 @ 8:00  I have personally reviewed and noted the following in the patient's chart:   . Medical and social history . Use of alcohol, tobacco or illicit drugs  . Current medications and supplements . Functional ability and status . Nutritional status . Physical activity . Advanced directives . List of other physicians . Hospitalizations, surgeries, and ER visits in previous 12 months . Vitals . Screenings to include cognitive, depression, and falls . Referrals and appointments  In addition, I have reviewed and discussed with patient certain preventive protocols, quality metrics, and best practice recommendations. A written personalized care plan for preventive services as well as general preventive health recommendations were provided to patient via mychart.     Varney Biles, LPN   [id number]

## 2020-10-07 ENCOUNTER — Other Ambulatory Visit: Payer: Self-pay

## 2020-10-07 ENCOUNTER — Ambulatory Visit: Payer: Medicare HMO | Admitting: Internal Medicine

## 2020-10-07 ENCOUNTER — Encounter: Payer: Self-pay | Admitting: Internal Medicine

## 2020-10-07 VITALS — BP 130/80 | HR 73 | Temp 97.6°F | Ht 70.0 in | Wt 232.0 lb

## 2020-10-07 DIAGNOSIS — E669 Obesity, unspecified: Secondary | ICD-10-CM

## 2020-10-07 DIAGNOSIS — E538 Deficiency of other specified B group vitamins: Secondary | ICD-10-CM

## 2020-10-07 DIAGNOSIS — Z13220 Encounter for screening for lipoid disorders: Secondary | ICD-10-CM | POA: Diagnosis not present

## 2020-10-07 DIAGNOSIS — E559 Vitamin D deficiency, unspecified: Secondary | ICD-10-CM

## 2020-10-07 DIAGNOSIS — Z0000 Encounter for general adult medical examination without abnormal findings: Secondary | ICD-10-CM | POA: Diagnosis not present

## 2020-10-07 DIAGNOSIS — Z23 Encounter for immunization: Secondary | ICD-10-CM

## 2020-10-07 DIAGNOSIS — E66811 Obesity, class 1: Secondary | ICD-10-CM

## 2020-10-07 DIAGNOSIS — E785 Hyperlipidemia, unspecified: Secondary | ICD-10-CM

## 2020-10-07 DIAGNOSIS — Z125 Encounter for screening for malignant neoplasm of prostate: Secondary | ICD-10-CM

## 2020-10-07 DIAGNOSIS — Z1389 Encounter for screening for other disorder: Secondary | ICD-10-CM

## 2020-10-07 DIAGNOSIS — R739 Hyperglycemia, unspecified: Secondary | ICD-10-CM | POA: Diagnosis not present

## 2020-10-07 LAB — VITAMIN D 25 HYDROXY (VIT D DEFICIENCY, FRACTURES): VITD: 30.65 ng/mL (ref 30.00–100.00)

## 2020-10-07 LAB — COMPREHENSIVE METABOLIC PANEL
ALT: 23 U/L (ref 0–53)
AST: 15 U/L (ref 0–37)
Albumin: 4.4 g/dL (ref 3.5–5.2)
Alkaline Phosphatase: 78 U/L (ref 39–117)
BUN: 15 mg/dL (ref 6–23)
CO2: 29 mEq/L (ref 19–32)
Calcium: 9 mg/dL (ref 8.4–10.5)
Chloride: 105 mEq/L (ref 96–112)
Creatinine, Ser: 1.12 mg/dL (ref 0.40–1.50)
GFR: 70.94 mL/min (ref >60.00)
Glucose, Bld: 105 mg/dL — ABNORMAL HIGH (ref 70–99)
Potassium: 4.6 mEq/L (ref 3.5–5.1)
Sodium: 139 mEq/L (ref 135–145)
Total Bilirubin: 0.7 mg/dL (ref 0.2–1.2)
Total Protein: 6.9 g/dL (ref 6.0–8.3)

## 2020-10-07 LAB — LIPID PANEL
Cholesterol: 159 mg/dL (ref 0–200)
HDL: 58.2 mg/dL (ref >39.00)
LDL Cholesterol: 85 mg/dL (ref 0–99)
NonHDL: 100.47
Total CHOL/HDL Ratio: 3
Triglycerides: 77 mg/dL (ref 0.0–149.0)
VLDL: 15.4 mg/dL (ref 0.0–40.0)

## 2020-10-07 LAB — CBC WITH DIFFERENTIAL/PLATELET
Basophils Absolute: 0 10*3/uL (ref 0.0–0.1)
Basophils Relative: 0.6 % (ref 0.0–3.0)
Eosinophils Absolute: 0.2 10*3/uL (ref 0.0–0.7)
Eosinophils Relative: 3.6 % (ref 0.0–5.0)
HCT: 41.4 % (ref 39.0–52.0)
Hemoglobin: 14 g/dL (ref 13.0–17.0)
Lymphocytes Relative: 34.4 % (ref 12.0–46.0)
Lymphs Abs: 1.5 10*3/uL (ref 0.7–4.0)
MCHC: 33.8 g/dL (ref 30.0–36.0)
MCV: 89.9 fl (ref 78.0–100.0)
Monocytes Absolute: 0.3 10*3/uL (ref 0.1–1.0)
Monocytes Relative: 6.7 % (ref 3.0–12.0)
Neutro Abs: 2.3 10*3/uL (ref 1.4–7.7)
Neutrophils Relative %: 54.7 % (ref 43.0–77.0)
Platelets: 207 10*3/uL (ref 150.0–400.0)
RBC: 4.61 Mil/uL (ref 4.22–5.81)
RDW: 13 % (ref 11.5–15.5)
WBC: 4.2 10*3/uL (ref 4.0–10.5)

## 2020-10-07 LAB — HEMOGLOBIN A1C: Hgb A1c MFr Bld: 5.4 % (ref 4.6–6.5)

## 2020-10-07 LAB — PSA: PSA: 0 ng/mL — ABNORMAL LOW (ref 0.10–4.00)

## 2020-10-07 MED ORDER — ATORVASTATIN CALCIUM 20 MG PO TABS: 20.0000 mg | ORAL_TABLET | Freq: Every day | ORAL | 3 refills | Status: DC

## 2020-10-07 MED ORDER — B-12 500 MCG PO TABS
1000.0000 ug | ORAL_TABLET | Freq: Every day | ORAL | 3 refills | Status: AC
Start: 2020-10-07 — End: ?

## 2020-10-07 NOTE — Progress Notes (Signed)
Chief Complaint  Patient presents with  . Follow-up  . Immunizations   Annual 1. htn controlled on losartan 50 mg qd  2. Given flu shot today  3.s/p covid 01/2020 doing well  4. Chronic pain workers comp case still seeing [place] pain clinic needs to coordinate from pain clinic to pain clinic transfer of care to [facility] Dr. Noelle Penner [facility] comp through [employer]    Review of Systems  Constitutional: Negative for weight loss.  HENT: Negative for hearing loss.   Eyes: Negative for blurred vision.  Respiratory: Negative for shortness of breath.   Cardiovascular: Negative for chest pain.  Gastrointestinal: Negative for abdominal pain.  Musculoskeletal: Positive for back pain. Negative for falls.  Skin: Negative for rash.  Neurological: Negative for headaches.  Psychiatric/Behavioral: Negative for depression.   Past Medical History:  Diagnosis Date  . Asthma    dust  . Cancer ([place])    prostate; early 92s   . Chronic pain    f/u pain clinic in [place]; arms, neck, back-pain clinic in [place]  . COVID-19    02/17/20 hosp 5/3-02/27/20 covid pneumonia  . Glaucoma   . HLD (hyperlipidemia)   . Hypertension   . Spinal cord injury, C5-C7 ([place])    C6.C7 in 2008 injury at work he was paralyzed from waist down but walking again;workers comp   Past Surgical History:  Procedure Laterality Date  . COLONOSCOPY WITH PROPOFOL N/A 08/18/2019   Procedure: COLONOSCOPY WITH PROPOFOL;  Surgeon: Lin Landsman, MD;  Location: [facility] ENDOSCOPY;  Service: Gastroenterology;  Laterality: N/A;  . LUMBAR LAMINECTOMY    . PROSTATECTOMY     had radiation no injections h/o prostate pump    Family History  Problem Relation Age of Onset  . Heart failure Mother   . Cancer Father        prostate  . Dementia Father   . Cancer Brother        prostate  . Prostate cancer Brother   . Pancreatic cancer Paternal Grandmother   . Prostate cancer Other   . Prostate cancer Brother        prostate  .  Dementia Sister    Social History   Socioeconomic History  . Marital status: Married    Spouse name: Not on file  . Number of children: Not on file  . Years of education: Not on file  . Highest education level: Not on file  Occupational History  . Not on file  Tobacco Use  . Smoking status: Former Research scientist (life sciences)  . Smokeless tobacco: Never Used  Substance and Sexual Activity  . Alcohol use: Yes    Comment: Occasionally   . Drug use: Not on file  . Sexual activity: Not on file  Other Topics Concern  . Not on file  Social History Narrative   Former smoker quit 38    Occasional etoh    Married with 2 sons and 1 daughter    From [place]   Retired    Social Determinants of Radio broadcast assistant Strain: [place]   . Difficulty of Paying Living Expenses: Not hard at all  Food Insecurity: No Food Insecurity  . Worried About Charity fundraiser in the Last Year: Never true  . Ran Out of Food in the Last Year: Never true  Transportation Needs: No Transportation Needs  . Lack of Transportation (Medical): No  . Lack of Transportation (Non-Medical): No  Physical Activity: Not on file  Stress: No Stress Concern Present  . Feeling of Stress : Not at all  Social Connections: Unknown  . Frequency of Communication with Friends and Family: Not on file  . Frequency of Social Gatherings with Friends and Family: Not on file  . Attends Religious Services: Not on file  . Active Member of Clubs or Organizations: Not on file  . Attends Archivist Meetings: Not on file  . Marital Status: Married  Human resources officer Violence: Not At Risk  . Fear of Current or Ex-Partner: No  . Emotionally Abused: No  . Physically Abused: No  . Sexually Abused: No   Current Meds  Medication Sig  . albuterol (PROAIR HFA) 108 (90 Base) MCG/ACT inhaler Inhale 1-2 puffs into the lungs every 6 (six) hours as needed for wheezing or shortness of breath.  . Ascorbic Acid (VITAMIN C) 1000 MG tablet Take 1,000 mg  by mouth daily.   . B Complex Vitamins (VITAMIN B-COMPLEX) TABS Take 1 tablet by mouth daily in the afternoon.   . bimatoprost (LUMIGAN) 0.01 % SOLN Place 1 drop into both eyes at bedtime. 1 drop affected eye  . DYMISTA 137-50 MCG/ACT SUSP Place 1 spray into both nostrils 2 (two) times daily as needed.  . gabapentin (NEURONTIN) 800 MG tablet Take 1 tablet (800 mg total) by mouth 3 (three) times daily.  Marland Kitchen losartan (COZAAR) 50 MG tablet Take 1 tablet (50 mg total) by mouth daily.  Marland Kitchen oxyCODONE-acetaminophen (PERCOCET) 10-325 MG tablet Take 1 tablet by mouth every 8 (eight) hours as needed for pain. Dr. Vira [facility] in [place]  . Polyethylene Glycol 400 (BLINK TEARS OP) Apply to eye.  . Quercetin 250 MG TABS Take 1 tablet (250 mg total) by mouth in the morning and at bedtime.  . Vitamin D, Ergocalciferol, 50 MCG (2000 UT) CAPS Take 4,000 Units by mouth daily.  . Zinc 100 MG TABS Take 1 tablet (100 mg total) by mouth daily.  . [DISCONTINUED] atorvastatin (LIPITOR) 20 MG tablet Take 1 tablet (20 mg total) by mouth daily.  . [DISCONTINUED] Cyanocobalamin (B-12) 500 MCG TABS Take 500 mcg by mouth daily.   Allergies  Allergen Reactions  . Percocet [Oxycodone-Acetaminophen]     Certain formulations itching     No results found for this or any previous visit (from the past 2160 hour(s)). Objective  Body mass index is 33.29 kg/m. Wt Readings from Last 3 Encounters:  10/07/20 232 lb (105.2 kg)  09/22/20 220 lb (99.8 kg)  05/05/20 220 lb (99.8 kg)   Temp Readings from Last 3 Encounters:  10/07/20 97.6 F (36.4 C) (Oral)  02/27/20 98.6 F (37 C)  08/18/19 (!) 96.9 F (36.1 C) (Tympanic)   BP Readings from Last 3 Encounters:  10/07/20 130/80  03/09/20 138/82  02/27/20 125/84   Pulse Readings from Last 3 Encounters:  10/07/20 73  03/09/20 84  02/27/20 88    Physical Exam Vitals and nursing note reviewed.  Constitutional:      Appearance: Normal appearance. He is  well-developed and well-groomed. He is obese.  HENT:     Head: Normocephalic and atraumatic.  Eyes:     Conjunctiva/sclera: Conjunctivae normal.     Pupils: Pupils are equal, round, and reactive to light.  Cardiovascular:     Rate and Rhythm: Normal rate and regular rhythm.     Heart sounds: Normal heart sounds. No murmur heard.   Pulmonary:     Effort: Pulmonary effort is normal.  Breath sounds: Normal breath sounds.  Abdominal:     General: Abdomen is flat.     Tenderness: There is no abdominal tenderness.  Skin:    General: Skin is warm and dry.  Neurological:     General: No focal deficit present.     Mental Status: He is alert and oriented to person, place, and time. Mental status is at baseline.     Gait: Gait normal.  Psychiatric:        Attention and Perception: Attention and perception normal.        Mood and Affect: Mood and affect normal.        Speech: Speech normal.        Behavior: Behavior normal. Behavior is cooperative.        Thought Content: Thought content normal.        Cognition and Memory: Cognition and memory normal.        Judgment: Judgment normal.     Assessment  Plan  Annual physical exam - Plan: Fasting labs today  Flu shot utdgiven today prevnar utdpna 23in future 1 year1/19/22  Pfizer 01/31/20 and 8/7/21disc booster Tdap as well and shingrixsent Rx today  Colonoscopy10/26/20 tubular adenoma f/u in 3 years  UndetectablePSA9/9/2020h/o prostate cancer with h/o removal and radiation no injections Former smoker quit in 45s  Occasional etoh   Given names of eye MD and dentists in the area  [place] Dr. Genia Plants [place]  [place]GD 09/25/10 esophagitis duodenitis mildly enlarged major papilla path neg h pylori chronic peptic duodentitis c/w adenomatous change; GERD   EGD path 06/05/12 gastric antrum bx negative duodenum negative no H pylori  EGD 01/23/15 mild erythema in  stomach   EGD 01/24/17-see report scanned in path 01/24/17 gastric antrum bx and duodenum bx negative no H pylori  Upper EUS 03/08/11 see report scanned in chart Upper EUS 01/23/15 see report scanned inchart Upper EUS 01/24/17 GB sludge, no gallstones, bile duct normal, hyperechoic foci and strands in pancreas and lobularity but no parenchymal calcifications/pancreatic ductal dilatation noted, 2 small cysts in pancreas body 4-5 mm liver with 6 mm cyst no lymphadenopathy  Colonoscopy 08/02/13 hyperplastic polyp sigmoid colon and mild hyperplastic changes rectum   Colonoscopy 07/27/10 semi pedunculated polyp splenic flexure IH/EH  Colonoscopy 07/31/08 pedunculated polyp transverse colon few diminutive sessile polyps rectum diverticulosis IH/EH  rec health diet and exercise  Hyperlipidemia, unspecified hyperlipidemia type - Plan: atorvastatin (LIPITOR) 20 MG tablet  B12 deficiency - Plan: Cyanocobalamin (B-12) 1000 MCG TABS  Provider: Dr. Olivia Mackie McLean-Scocuzza-Internal Medicine

## 2020-10-07 NOTE — Patient Instructions (Addendum)
Consider Tdap and shingrix vaccines at pharmacy   covid booster in 6-8 months pfizer vaccine after 2nd dose -12/2020 at pharmacy   Pneumonia 23 vaccine 11/10/20 at our clinic    B12 1000 MCG daily

## 2020-10-08 LAB — URINALYSIS, ROUTINE W REFLEX MICROSCOPIC
Bilirubin Urine: NEGATIVE
Glucose, UA: NEGATIVE
Hgb urine dipstick: NEGATIVE
Ketones, ur: NEGATIVE
Leukocytes,Ua: NEGATIVE
Nitrite: NEGATIVE
Protein, ur: NEGATIVE
Specific Gravity, Urine: 1.025 (ref 1.001–1.03)
pH: <=5 (ref 5.0–8.0)

## 2020-10-11 ENCOUNTER — Encounter: Payer: Self-pay | Admitting: Internal Medicine

## 2020-11-23 ENCOUNTER — Other Ambulatory Visit: Payer: Self-pay

## 2020-11-23 DIAGNOSIS — H409 Unspecified glaucoma: Secondary | ICD-10-CM

## 2020-11-23 MED ORDER — LUMIGAN 0.01 % OP SOLN: 1.0000 [drp] | Freq: Every day | OPHTHALMIC | 5 refills | Status: DC

## 2020-11-24 ENCOUNTER — Other Ambulatory Visit: Payer: Self-pay | Admitting: Internal Medicine

## 2020-11-24 ENCOUNTER — Telehealth: Payer: Self-pay | Admitting: Internal Medicine

## 2020-11-24 DIAGNOSIS — J309 Allergic rhinitis, unspecified: Secondary | ICD-10-CM

## 2020-11-24 MED ORDER — FLUTICASONE PROPIONATE 50 MCG/ACT NA SUSP: 2.0000 | Freq: Every day | NASAL | 11 refills | Status: DC | PRN

## 2020-11-24 MED ORDER — AZELASTINE HCL 0.1 % NA SOLN: 2.0000 | Freq: Two times a day (BID) | NASAL | 11 refills | Status: DC | PRN

## 2020-11-24 NOTE — Telephone Encounter (Signed)
Prior authorization has been denied.   sing available formulary data, we have determined that the following medications may be covered:  PA Requirement Azelastine-Fluticasone,  PA Required Azelastine (Spra),   PA Not Required Mometasone (Spry),   PA Not Required Flunisolide 25 Mcg (0.025 %) Spry, [place] Not Required

## 2020-11-24 NOTE — Telephone Encounter (Signed)
Left message to return call 

## 2020-11-24 NOTE — Telephone Encounter (Signed)
Prior authorization has been submitted for patient's Dymista.   Awaiting approval or denial.

## 2020-11-24 NOTE — Telephone Encounter (Signed)
Patient informed and verbalized understanding.  He is okay with this being sent in separate

## 2020-11-24 NOTE — Telephone Encounter (Signed)
Does he want to try Astelin and flonase separately

## 2021-02-21 ENCOUNTER — Other Ambulatory Visit: Payer: Self-pay | Admitting: Internal Medicine

## 2021-02-21 DIAGNOSIS — I10 Essential (primary) hypertension: Secondary | ICD-10-CM

## 2021-04-07 ENCOUNTER — Other Ambulatory Visit: Payer: Self-pay

## 2021-04-07 ENCOUNTER — Encounter: Payer: Self-pay | Admitting: Internal Medicine

## 2021-04-07 ENCOUNTER — Ambulatory Visit: Payer: Medicare HMO | Admitting: Internal Medicine

## 2021-04-07 VITALS — BP 122/88 | HR 76 | Temp 98.4°F | Ht 70.0 in | Wt 232.4 lb

## 2021-04-07 DIAGNOSIS — E785 Hyperlipidemia, unspecified: Secondary | ICD-10-CM | POA: Diagnosis not present

## 2021-04-07 DIAGNOSIS — I739 Peripheral vascular disease, unspecified: Secondary | ICD-10-CM

## 2021-04-07 DIAGNOSIS — L309 Dermatitis, unspecified: Secondary | ICD-10-CM

## 2021-04-07 DIAGNOSIS — I10 Essential (primary) hypertension: Secondary | ICD-10-CM | POA: Diagnosis not present

## 2021-04-07 DIAGNOSIS — J309 Allergic rhinitis, unspecified: Secondary | ICD-10-CM

## 2021-04-07 DIAGNOSIS — J4520 Mild intermittent asthma, uncomplicated: Secondary | ICD-10-CM | POA: Diagnosis not present

## 2021-04-07 DIAGNOSIS — H409 Unspecified glaucoma: Secondary | ICD-10-CM

## 2021-04-07 DIAGNOSIS — Z1329 Encounter for screening for other suspected endocrine disorder: Secondary | ICD-10-CM

## 2021-04-07 DIAGNOSIS — R1084 Generalized abdominal pain: Secondary | ICD-10-CM

## 2021-04-07 DIAGNOSIS — H269 Unspecified cataract: Secondary | ICD-10-CM

## 2021-04-07 LAB — CBC WITH DIFFERENTIAL/PLATELET
Basophils Absolute: 0 10*3/uL (ref 0.0–0.1)
Basophils Relative: 0.5 % (ref 0.0–3.0)
Eosinophils Absolute: 0.1 10*3/uL (ref 0.0–0.7)
Eosinophils Relative: 2.9 % (ref 0.0–5.0)
HCT: 42 % (ref 39.0–52.0)
Hemoglobin: 14.2 g/dL (ref 13.0–17.0)
Lymphocytes Relative: 30.8 % (ref 12.0–46.0)
Lymphs Abs: 1.3 10*3/uL (ref 0.7–4.0)
MCHC: 33.8 g/dL (ref 30.0–36.0)
MCV: 90.9 fl (ref 78.0–100.0)
Monocytes Absolute: 0.2 10*3/uL (ref 0.1–1.0)
Monocytes Relative: 5.6 % (ref 3.0–12.0)
Neutro Abs: 2.6 10*3/uL (ref 1.4–7.7)
Neutrophils Relative %: 60.2 % (ref 43.0–77.0)
Platelets: 206 10*3/uL (ref 150.0–400.0)
RBC: 4.62 Mil/uL (ref 4.22–5.81)
RDW: 13.6 % (ref 11.5–15.5)
WBC: 4.2 10*3/uL (ref 4.0–10.5)

## 2021-04-07 LAB — LIPID PANEL
Cholesterol: 158 mg/dL (ref 0–200)
HDL: 59.6 mg/dL (ref >39.00)
LDL Cholesterol: 87 mg/dL (ref 0–99)
NonHDL: 98.12
Total CHOL/HDL Ratio: 3
Triglycerides: 58 mg/dL (ref 0.0–149.0)
VLDL: 11.6 mg/dL (ref 0.0–40.0)

## 2021-04-07 LAB — COMPREHENSIVE METABOLIC PANEL
ALT: 23 U/L (ref 0–53)
AST: 16 U/L (ref 0–37)
Albumin: 4.6 g/dL (ref 3.5–5.2)
Alkaline Phosphatase: 80 U/L (ref 39–117)
BUN: 11 mg/dL (ref 6–23)
CO2: 29 mEq/L (ref 19–32)
Calcium: 9.5 mg/dL (ref 8.4–10.5)
Chloride: 104 mEq/L (ref 96–112)
Creatinine, Ser: 1.1 mg/dL (ref 0.40–1.50)
GFR: 72.24 mL/min (ref >60.00)
Glucose, Bld: 107 mg/dL — ABNORMAL HIGH (ref 70–99)
Potassium: 4.2 mEq/L (ref 3.5–5.1)
Sodium: 141 mEq/L (ref 135–145)
Total Bilirubin: 1.1 mg/dL (ref 0.2–1.2)
Total Protein: 7.1 g/dL (ref 6.0–8.3)

## 2021-04-07 LAB — TSH: TSH: 1.92 u[IU]/mL (ref 0.35–4.50)

## 2021-04-07 MED ORDER — AZELASTINE-FLUTICASONE 137-50 MCG/ACT NA SUSP: 1.0000 | Freq: Two times a day (BID) | NASAL | 11 refills | Status: DC | PRN

## 2021-04-07 MED ORDER — PROAIR HFA 108 (90 BASE) MCG/ACT IN AERS: 1.0000 | INHALATION_SPRAY | Freq: Four times a day (QID) | RESPIRATORY_TRACT | 11 refills | Status: DC | PRN

## 2021-04-07 MED ORDER — LOSARTAN POTASSIUM-HCTZ 50-12.5 MG PO TABS: 1.0000 | ORAL_TABLET | Freq: Every day | ORAL | 3 refills | Status: DC

## 2021-04-07 MED ORDER — HYDROCORTISONE 2.5 % EX LOTN: TOPICAL_LOTION | Freq: Two times a day (BID) | CUTANEOUS | 0 refills | Status: DC

## 2021-04-07 MED ORDER — ATORVASTATIN CALCIUM 20 MG PO TABS: 20.0000 mg | ORAL_TABLET | Freq: Every day | ORAL | 3 refills | Status: DC

## 2021-04-07 NOTE — Patient Instructions (Addendum)
Miralax powder as needed daily 8 oz liquid over the counter  Colace stool softer 100-200 mg daily as needed  Disc with Dr. Stephanie Coup about constipation pain medicaiton   Pfizer 3rd dose in 5-6 months get 4th dose Tdap vaccine  Shingrix vaccine 2 doses 2nd dose before 6 months   Dove soap    Losartan; Hydrochlorothiazide Tablets What is this medication? LOSARTAN; HYDROCHLOROTHIAZIDE (loe SAR tan; hye droe klor oh THYE a zide) treats high blood pressure. It may also be used to prevent a stroke in people with heart disease and high blood pressure. It relaxes your blood vessels and helps your kidneys remove more fluid through the urine, which lowers bloodpressure. This medication is a combination of an ARB and diuretic. This medicine may be used for other purposes; ask your health care provider orpharmacist if you have questions. COMMON BRAND NAME(S): Hyzaar What should I tell my care team before I take this medication? They need to know if you have any of these conditions: Decreased urine Diabetes If you are on a special diet, like a low-salt diet Immune system problems, like lupus Kidney disease Liver disease An unusual or allergic reaction to losartan, hydrochlorothiazide, sulfa drugs, other medications, foods, dyes, or preservatives Pregnant or trying to get pregnant Breast-feeding How should I use this medication? Take this medication by mouth. Take it as directed on the prescription label at the same time every day. You can take it with or without food. If it upsets your stomach, take it with food. Keep taking it unless your care team tells youto stop. Talk to your care team about the use of this drug in children. Special care maybe needed. Overdosage: If you think you have taken too much of this medicine contact apoison control center or emergency room at once. NOTE: This medicine is only for you. Do not share this medicine with others. What if I miss a dose? If you miss a dose, take  it as soon as you can. If it is almost time for yournext dose, take only that dose. Do not take double or extra doses. What may interact with this medication? Do not take this medication with any of the following: Cidofovir Dofetilide Tranylcypromine This medication may also interact with the following: Barbiturates, like phenobarbital Blood pressure medications Celecoxib Diuretics, especially triamterene, spironolactone or amiloride Fluconazole Lithium Medications for diabetes Medications that relax the muscles for surgery Narcotic medications for pain NSAIDs, medications for pain and inflammation, like ibuprofen or naproxen Potassium salts or potassium supplements Rifampin Some cholesterol-lowering medications like cholestyramine or colestipol Steroid medications like prednisone or cortisone This list may not describe all possible interactions. Give your health care provider a list of all the medicines, herbs, non-prescription drugs, or dietary supplements you use. Also tell them if you smoke, drink alcohol, or use illegaldrugs. Some items may interact with your medicine. What should I watch for while using this medication? Check your blood pressure regularly while you are taking this medication. Ask your care team what your blood pressure should be and when you should contact them. When you check your blood pressure, write down the measurements to show your care team. If you are taking this medication for a long time, you must visit your care team for regular checks on your progress. Make sure youschedule appointments on a regular basis. You must not get dehydrated. Ask your care team how much fluid you need to drink a day. Check with them if you get an attack of  severe diarrhea, nausea and vomiting, or if you sweat a lot. The loss of too much body fluid can makeit dangerous for you to take this medication. Women should inform their doctor if they wish to become pregnant or think they  might be pregnant. There is a potential for serious side effects to an unborn child, particularly in the second or third trimester. Talk to your care team orpharmacist for more information. You may get drowsy or dizzy. Do not drive, use machinery, or do anything that needs mental alertness until you know how this medication affects you. Do not stand or sit up quickly, especially if you are an older patient. This reduces the risk of dizzy or fainting spells. Alcohol can make you more drowsy anddizzy. Avoid alcoholic drinks. This medication may increase blood sugar. Ask your care team if changes in dietor medications are needed if you have diabetes. Talk to your care team about your risk of skin cancer. You may be more at riskfor skin cancer if you take this medication. This medication can make you more sensitive to the sun. Keep out of the sun. If you cannot avoid being in the sun, wear protective clothing and use sunscreen.Do not use sun lamps or tanning beds/booths. Avoid salt substitutes unless you are told otherwise by your care team. Do not treat yourself for coughs, colds, or pain while you are taking this medication without asking your care team for advice. Some ingredients mayincrease your blood pressure. What side effects may I notice from receiving this medication? Side effects that you should report to your care team as soon as possible: Allergic reactions-skin rash, itching, hives, swelling of the face, lips, tongue, or throat Dehydration-increased thirst, dry mouth, feeling faint or lightheaded, headache, dark yellow or brown urine Gout-severe pain, redness, warmth, or swelling in the joints, such as the big toe Kidney injury-decrease in the amount of urine, swelling of the ankles, hands, or feet Low blood pressure-dizziness, feeling faint or lightheaded, blurry vision Low potassium level-muscle pain or cramps, unusual weakness, fatigue, fast or irregular heartbeat, constipation Sudden eye  pain or change in vision such as blurred vision, seeing halos around lights, vision loss Side effects that usually do not require medical attention (report to your careteam if they continue or are bothersome): Change in sex drive or performance Dizziness Headache Runny or stuffy nose Upset stomach This list may not describe all possible side effects. Call your doctor for medical advice about side effects. You may report side effects to FDA at1-800-FDA-1088. Where should I keep my medication? Keep out of the reach of children and pets. Store at room temperature between 15 and 30 degrees C (59 and 86 degrees F). Protect from light. Keep the container tightly closed. Throw away any unusedmedication after the expiration date. NOTE: This sheet is a summary. It may not cover all possible information. If you have questions about this medicine, talk to your doctor, pharmacist, orhealth care provider.  2022 Elsevier/Gold Standard (2020-11-05 10:46:42)

## 2021-04-07 NOTE — Progress Notes (Signed)
Chief Complaint  Patient presents with   Follow-up   F/u  1. Htn sl elevated on losartan 50 mg qd higher at home  2. Chronic pain 6/10 on xtampza bid 9 mg qd seeing Dr. Stephanie Coup and off percocet 3-4 x per day 10-325 and its helping  Having constipation with pain meds tried metamucil 3. C/o left upper quadrant ab pain mild and intermittent like electrical shocks nothing tried  4. Rash to neck using [place] soap  Review of Systems  Constitutional:  Negative for weight loss.  HENT:  Negative for hearing loss.   Eyes:  Negative for blurred vision.  Respiratory:  Negative for shortness of breath.   Cardiovascular:  Negative for chest pain.  Gastrointestinal:  Positive for constipation.  Musculoskeletal:  Positive for back pain and neck pain.  Skin:  Positive for rash.  Psychiatric/Behavioral:  Negative for depression.   Past Medical History:  Diagnosis Date   Asthma    dust   Cancer ([place])    prostate; early 41s    Chronic pain    f/u pain clinic in [place]; arms, neck, back-pain clinic in [place]   COVID-19    02/17/20 hosp 5/3-02/27/20 covid pneumonia   Glaucoma    HLD (hyperlipidemia)    Hypertension    Spinal cord injury, C5-C7 ([place])    C6.C7 in 2008 injury at work he was paralyzed from waist down but walking again;workers comp   Past Surgical History:  Procedure Laterality Date   COLONOSCOPY WITH PROPOFOL N/A 08/18/2019   Procedure: COLONOSCOPY WITH PROPOFOL;  Surgeon: Lin Landsman, MD;  Location: [facility] ENDOSCOPY;  Service: Gastroenterology;  Laterality: N/A;   LUMBAR LAMINECTOMY     PROSTATECTOMY     had radiation no injections h/o prostate pump    Family History  Problem Relation Age of Onset   Heart failure Mother    Cancer Father        prostate   Dementia Father    Cancer Brother        prostate   Prostate cancer Brother    Pancreatic cancer Paternal Grandmother    Prostate cancer Other    Prostate cancer Brother        prostate   Dementia Sister    Social  History   Socioeconomic History   Marital status: Married    Spouse name: Not on file   Number of children: Not on file   Years of education: Not on file   Highest education level: Not on file  Occupational History   Not on file  Tobacco Use   Smoking status: Former    Pack years: 0.00   Smokeless tobacco: Never  Substance and Sexual Activity   Alcohol use: Yes    Comment: Occasionally    Drug use: Not on file   Sexual activity: Not on file  Other Topics Concern   Not on file  Social History Narrative   Former smoker quit 38    Occasional etoh    Married with 2 sons and 1 daughter    From [place]   Retired    Social Determinants of Radio broadcast assistant Strain: Low Risk    Difficulty of Paying Living Expenses: Not hard at all  Food Insecurity: No Food Insecurity   Worried About Charity fundraiser in the Last Year: Never true   Arboriculturist in the Last Year: Never true  Transportation Needs: No Transportation Needs   Lack  of Transportation (Medical): No   Lack of Transportation (Non-Medical): No  Physical Activity: Not on file  Stress: No Stress Concern Present   Feeling of Stress : Not at all  Social Connections: Unknown   Frequency of Communication with Friends and Family: Not on file   Frequency of Social Gatherings with Friends and Family: Not on file   Attends Religious Services: Not on file   Active Member of Clubs or Organizations: Not on file   Attends Archivist Meetings: Not on file   Marital Status: Married  Human resources officer Violence: Not At Risk   Fear of Current or Ex-Partner: No   Emotionally Abused: No   Physically Abused: No   Sexually Abused: No   Current Meds  Medication Sig   Ascorbic Acid (VITAMIN C) 1000 MG tablet Take 1,000 mg by mouth daily.    B Complex Vitamins (VITAMIN B-COMPLEX) TABS Take 1 tablet by mouth daily in the afternoon.    bimatoprost (LUMIGAN) 0.01 % SOLN Place 1 drop into both eyes at bedtime. 1 drop  affected eye   Cyanocobalamin (B-12) 500 MCG TABS Take 1,000 mcg by mouth daily.   gabapentin (NEURONTIN) 800 MG tablet Take 1 tablet (800 mg total) by mouth 3 (three) times daily.   hydrocortisone 2.5 % lotion Apply topically 2 (two) times daily. Bid neck   losartan-hydrochlorothiazide (HYZAAR) 50-12.5 MG tablet Take 1 tablet by mouth daily. In am D/c losartan 50 mg alone   Polyethylene Glycol 400 (BLINK TEARS OP) Apply to eye.   Quercetin 250 MG TABS Take 1 tablet (250 mg total) by mouth in the morning and at bedtime.   Vitamin D, Ergocalciferol, 50 MCG (2000 UT) CAPS Take 4,000 Units by mouth daily.   Zinc 100 MG TABS Take 1 tablet (100 mg total) by mouth daily.   [DISCONTINUED] atorvastatin (LIPITOR) 20 MG tablet Take 1 tablet (20 mg total) by mouth daily.   [DISCONTINUED] azelastine (ASTELIN) 0.1 % nasal spray Place 2 sprays into both nostrils 2 (two) times daily as needed for rhinitis. Use in each nostril as directed   [DISCONTINUED] DYMISTA 137-50 MCG/ACT SUSP Place 1 spray into both nostrils 2 (two) times daily as needed.   [DISCONTINUED] fluticasone (FLONASE) 50 MCG/ACT nasal spray Place 2 sprays into both nostrils daily as needed for allergies or rhinitis.   [DISCONTINUED] losartan (COZAAR) 50 MG tablet TAKE 1 TABLET(50 MG) BY MOUTH DAILY(STOP TAKING DIOVAN HCT)   [DISCONTINUED] oxyCODONE-acetaminophen (PERCOCET) 10-325 MG tablet Take 1 tablet by mouth every 8 (eight) hours as needed for pain. Dr. Vira [facility] in [place] [Oxycodone-Acetaminophen]     Certain formulations itching     No results found for this or any previous visit (from the past 2160 hour(s)). Objective  Body mass index is 33.35 kg/m. Wt Readings from Last 3 Encounters:  04/07/21 232 lb 6.4 oz (105.4 kg)  10/07/20 232 lb (105.2 kg)  09/22/20 220 lb (99.8 kg)   Temp Readings from Last 3 Encounters:  04/07/21 98.4 F (36.9 C) (Oral)  10/07/20 97.6  F (36.4 C) (Oral)  02/27/20 98.6 F (37 C)   BP Readings from Last 3 Encounters:  04/07/21 122/88  10/07/20 130/80  03/09/20 138/82   Pulse Readings from Last 3 Encounters:  04/07/21 76  10/07/20 73  03/09/20 84    Physical Exam Vitals and nursing note reviewed.  Constitutional:      Appearance: Normal  appearance. He is well-developed and well-groomed. He is obese.  HENT:     Head: Normocephalic and atraumatic.  Eyes:     Conjunctiva/sclera: Conjunctivae normal.     Pupils: Pupils are equal, round, and reactive to light.  Cardiovascular:     Rate and Rhythm: Normal rate and regular rhythm.     Heart sounds: Normal heart sounds. No murmur heard. Pulmonary:     Effort: Pulmonary effort is normal.     Breath sounds: Normal breath sounds.  Abdominal:     General: Abdomen is flat. Bowel sounds are normal.  Skin:    General: Skin is warm and dry.  Neurological:     General: No focal deficit present.     Mental Status: He is alert and oriented to person, place, and time. Mental status is at baseline.     Gait: Gait normal.  Psychiatric:        Attention and Perception: Attention and perception normal.        Mood and Affect: Mood and affect normal.        Speech: Speech normal.        Behavior: Behavior normal. Behavior is cooperative.        Thought Content: Thought content normal.        Cognition and Memory: Cognition and memory normal.        Judgment: Judgment normal.    Assessment  Plan  Essential hypertension - Plan: Comprehensive metabolic panel, Lipid panel, CBC w/Diff losartan 50 mg qd higher at home change to losartan 50-12.5 mg qd   Mild intermittent extrinsic asthma without complication - Plan: PROAIR HFA 108 (90 Base) MCG/ACT inhaler  Hyperlipidemia, unspecified hyperlipidemia type - Plan: atorvastatin (LIPITOR) 20 MG tablet  Allergic rhinitis, unspecified seasonality, unspecified trigger  Eczema, neck- Plan: hydrocortisone 2.5 % lotion Rec dove  soap  Thyroid disorder screening - Plan: TSH  Cataract of both eyes, unspecified cataract type Glaucoma of both eyes, unspecified glaucoma type Fu eye doc  Generalized abdominal pain - Plan: CT Abdomen Pelvis Wo Contrast   HM Fasting labs today  Flu shot utd  prevnar utd pna 23 in future 1 year 11/10/20 [place] 01/31/20 and 05/29/20 disc booster Tdap as well and shingrix sent Rx today    Colonoscopy 08/18/19 tubular adenoma f/u in 3 years     Undetectable PSA 07/02/2019 h/o prostate cancer with h/o removal and radiation no injections Former smoker quit in 17s Occasional etoh   Given names of eye MD and dentists in the area Former PCP Dr. Genia Plants [place] in [place] in [place] received    EGD 09/25/10 esophagitis duodenitis mildly enlarged major papilla path neg h pylori chronic peptic duodentitis c/w adenomatous change; GERD   EGD path 06/05/12 gastric antrum bx negative duodenum negative no H pylori    EGD 01/23/15 mild erythema in stomach   EGD 01/24/17 -see report scanned in  path 01/24/17 gastric antrum bx and duodenum bx negative no H pylori    Upper EUS 03/08/11 see report scanned in chart  Upper EUS 01/23/15 see report scanned in chart  Upper EUS 01/24/17 GB sludge, no gallstones, bile duct normal, hyperechoic foci and strands in pancreas and lobularity but no parenchymal calcifications/pancreatic ductal dilatation noted, 2 small cysts in pancreas body 4-5 mm liver with 6 mm cyst no lymphadenopathy    Colonoscopy 08/02/13 hyperplastic polyp sigmoid colon and mild hyperplastic changes rectum   Colonoscopy 07/27/10 semi pedunculated polyp splenic  flexure IH/EH   Colonoscopy 07/31/08 pedunculated polyp transverse colon few diminutive sessile polyps rectum diverticulosis IH/EH    rec [facility] diet and exercise   Provider: Dr. Olivia Mackie McLean-Scocuzza-[facility]

## 2021-04-15 ENCOUNTER — Other Ambulatory Visit: Payer: Self-pay

## 2021-04-15 ENCOUNTER — Ambulatory Visit
Admission: RE | Admit: 2021-04-15 | Discharge: 2021-04-15 | Disposition: A | Discharge status: Home / Self Care | Payer: Medicare HMO | Source: Ambulatory Visit | Attending: Internal Medicine | Admitting: Internal Medicine

## 2021-04-15 DIAGNOSIS — R1084 Generalized abdominal pain: Secondary | ICD-10-CM | POA: Diagnosis not present

## 2021-07-04 ENCOUNTER — Other Ambulatory Visit: Payer: Self-pay | Admitting: Internal Medicine

## 2021-07-04 DIAGNOSIS — L309 Dermatitis, unspecified: Secondary | ICD-10-CM

## 2021-08-11 ENCOUNTER — Other Ambulatory Visit: Payer: Self-pay | Admitting: Internal Medicine

## 2021-08-11 DIAGNOSIS — J4520 Mild intermittent asthma, uncomplicated: Secondary | ICD-10-CM

## 2021-08-12 ENCOUNTER — Other Ambulatory Visit: Payer: Self-pay | Admitting: Internal Medicine

## 2021-08-12 DIAGNOSIS — J4520 Mild intermittent asthma, uncomplicated: Secondary | ICD-10-CM

## 2021-08-22 ENCOUNTER — Other Ambulatory Visit: Payer: Self-pay | Admitting: Internal Medicine

## 2021-08-22 ENCOUNTER — Telehealth: Payer: Self-pay | Admitting: Internal Medicine

## 2021-08-22 DIAGNOSIS — J4520 Mild intermittent asthma, uncomplicated: Secondary | ICD-10-CM

## 2021-08-22 MED ORDER — ALBUTEROL SULFATE HFA 108 (90 BASE) MCG/ACT IN AERS: 1.0000 | INHALATION_SPRAY | Freq: Four times a day (QID) | RESPIRATORY_TRACT | 11 refills | Status: DC | PRN

## 2021-08-22 NOTE — Telephone Encounter (Signed)
Received a fax from [employer] stating that [place] 108 (90 Base) MCG/ACT inhaler Brand name is no longer being made by the manufacturer. Needing an alternative sent in.   Please advise

## 2021-09-05 ENCOUNTER — Encounter: Payer: Self-pay | Admitting: Internal Medicine

## 2021-09-05 ENCOUNTER — Telehealth: Payer: Self-pay | Admitting: Internal Medicine

## 2021-09-05 DIAGNOSIS — I739 Peripheral vascular disease, unspecified: Secondary | ICD-10-CM | POA: Insufficient documentation

## 2021-09-05 NOTE — Telephone Encounter (Signed)
[place] [facility] peripheral artery disease test result  left foot 1.03 normal  right foot 0.83 ? Circulation issues does he want to see vascular to further work up this if from testing done 06/10/21

## 2021-09-07 NOTE — Addendum Note (Signed)
Addended by: Orland Mustard on: 09/07/2021 03:06 PM   Modules accepted: Orders

## 2021-09-07 NOTE — Telephone Encounter (Signed)
Patient informed and verbalized understanding.    Patient agreeable to referral

## 2021-09-07 NOTE — Telephone Encounter (Signed)
Left message to return call 

## 2021-09-23 ENCOUNTER — Ambulatory Visit (INDEPENDENT_AMBULATORY_CARE_PROVIDER_SITE_OTHER): Payer: Medicare HMO

## 2021-09-23 VITALS — Ht 70.0 in | Wt 232.0 lb

## 2021-09-23 DIAGNOSIS — Z0000 Encounter for general adult medical examination without abnormal findings: Secondary | ICD-10-CM | POA: Diagnosis not present

## 2021-09-23 NOTE — Patient Instructions (Addendum)
[name] , Thank you for taking time to come for your Medicare Wellness Visit. I appreciate your ongoing commitment to your health goals. Please review the following plan we discussed and let me know if I can assist you in the future.   These are the goals we discussed:  Goals       Patient Stated     Follow up with Primary Care Provider (pt-stated)      Schedule dental exam       Increase physical activity (pt-stated)      Increase strength through physical therapy        This is a list of the screening recommended for you and due dates:  Health Maintenance  Topic Date Due   Flu Shot  01/20/2022*   Pneumococcal Vaccination (2 - PPSV23 if available, else PCV20) 09/23/2022*   Zoster (Shingles) Vaccine (2 of 2) 10/07/2021   Colon Cancer Screening  08/17/2022   Tetanus Vaccine  06/03/2031   COVID-19 Vaccine  Completed   Hepatitis C Screening: USPSTF Recommendation to screen - Ages 18-79 yo.  Completed   HIV Screening  Completed   HPV Vaccine  Aged Out  *Topic was postponed. The date shown is not the original due date.    Advanced directives: not yet completed  Conditions/risks identified: none new  Follow up in one year for your annual wellness visit   Preventive Care 40-64 Years, Male Preventive care refers to lifestyle choices and visits with your health care provider that can promote health and wellness. What does preventive care include? A yearly physical exam. This is also called an annual well check. Dental exams once or twice a year. Routine eye exams. Ask your health care provider how often you should have your eyes checked. Personal lifestyle choices, including: Daily care of your teeth and gums. Regular physical activity. Eating a healthy diet. Avoiding tobacco and drug use. Limiting alcohol use. Practicing safe sex. Taking low-dose aspirin every day starting at age 51. What happens during an annual well check? The services and screenings done by your  health care provider during your annual well check will depend on your age, overall health, lifestyle risk factors, and family history of disease. Counseling  Your health care provider may ask you questions about your: Alcohol use. Tobacco use. Drug use. Emotional well-being. Home and relationship well-being. Sexual activity. Eating habits. Work and work Statistician. Screening  You may have the following tests or measurements: Height, weight, and BMI. Blood pressure. Lipid and cholesterol levels. These may be checked every 5 years, or more frequently if you are over 102 years old. Skin check. Lung cancer screening. You may have this screening every year starting at age 40 if you have a 30-pack-year history of smoking and currently smoke or have quit within the past 15 years. Fecal occult blood test (FOBT) of the stool. You may have this test every year starting at age 100. Flexible sigmoidoscopy or colonoscopy. You may have a sigmoidoscopy every 5 years or a colonoscopy every 10 years starting at age 82. Prostate cancer screening. Recommendations will vary depending on your family history and other risks. Hepatitis C blood test. Hepatitis B blood test. Sexually transmitted disease (STD) testing. Diabetes screening. This is done by checking your blood sugar (glucose) after you have not eaten for a while (fasting). You may have this done every 1-3 years. Discuss your test results, treatment options, and if necessary, the need for more tests with your health care provider. Vaccines  Your health care provider may recommend certain vaccines, such as: Influenza vaccine. This is recommended every year. Tetanus, diphtheria, and acellular pertussis (Tdap, Td) vaccine. You may need a Td booster every 10 years. Zoster vaccine. You may need this after age 14. Pneumococcal 13-valent conjugate (PCV13) vaccine. You may need this if you have certain conditions and have not been vaccinated. Pneumococcal  polysaccharide (PPSV23) vaccine. You may need one or two doses if you smoke cigarettes or if you have certain conditions. Talk to your health care provider about which screenings and vaccines you need and how often you need them. This information is not intended to replace advice given to you by your health care provider. Make sure you discuss any questions you have with your health care provider. Document Released: 11/05/2015 Document Revised: 06/28/2016 Document Reviewed: 08/10/2015 Elsevier Interactive Patient Education  2017 [place] Prevention in the Home Falls can cause injuries. They can happen to people of all ages. There are many things you can do to make your home safe and to help prevent falls. What can I do on the outside of my home? Regularly fix the edges of walkways and driveways and fix any cracks. Remove anything that might make you trip as you walk through a door, such as a raised step or threshold. Trim any bushes or trees on the path to your home. Use bright outdoor lighting. Clear any walking paths of anything that might make someone trip, such as rocks or tools. Regularly check to see if handrails are loose or broken. Make sure that both sides of any steps have handrails. Any raised decks and porches should have guardrails on the edges. Have any leaves, snow, or ice cleared regularly. Use sand or salt on walking paths during winter. Clean up any spills in your garage right away. This includes oil or grease spills. What can I do in the bathroom? Use night lights. Install grab bars by the toilet and in the tub and shower. Do not use towel bars as grab bars. Use non-skid mats or decals in the tub or shower. If you need to sit down in the shower, use a plastic, non-slip stool. Keep the floor dry. Clean up any water that spills on the floor as soon as it happens. Remove soap buildup in the tub or shower regularly. Attach bath mats securely with double-sided  non-slip rug tape. Do not have throw rugs and other things on the floor that can make you trip. What can I do in the bedroom? Use night lights. Make sure that you have a light by your bed that is easy to reach. Do not use any sheets or blankets that are too big for your bed. They should not hang down onto the floor. Have a firm chair that has side arms. You can use this for support while you get dressed. Do not have throw rugs and other things on the floor that can make you trip. What can I do in the kitchen? Clean up any spills right away. Avoid walking on wet floors. Keep items that you use a lot in easy-to-reach places. If you need to reach something above you, use a strong step stool that has a grab bar. Keep electrical cords out of the way. Do not use floor polish or wax that makes floors slippery. If you must use wax, use non-skid floor wax. Do not have throw rugs and other things on the floor that can make you trip. What can I  do with my stairs? Do not leave any items on the stairs. Make sure that there are handrails on both sides of the stairs and use them. Fix handrails that are broken or loose. Make sure that handrails are as long as the stairways. Check any carpeting to make sure that it is firmly attached to the stairs. Fix any carpet that is loose or worn. Avoid having throw rugs at the top or bottom of the stairs. If you do have throw rugs, attach them to the floor with carpet tape. Make sure that you have a light switch at the top of the stairs and the bottom of the stairs. If you do not have them, ask someone to add them for you. What else can I do to help prevent falls? Wear shoes that: Do not have high heels. Have rubber bottoms. Are comfortable and fit you well. Are closed at the toe. Do not wear sandals. If you use a stepladder: Make sure that it is fully opened. Do not climb a closed stepladder. Make sure that both sides of the stepladder are locked into place. Ask  someone to hold it for you, if possible. Clearly mark and make sure that you can see: Any grab bars or handrails. First and last steps. Where the edge of each step is. Use tools that help you move around (mobility aids) if they are needed. These include: Canes. Walkers. Scooters. Crutches. Turn on the lights when you go into a dark area. Replace any light bulbs as soon as they burn out. Set up your furniture so you have a clear path. Avoid moving your furniture around. If any of your floors are uneven, fix them. If there are any pets around you, be aware of where they are. Review your medicines with your doctor. Some medicines can make you feel dizzy. This can increase your chance of falling. Ask your doctor what other things that you can do to help prevent falls. This information is not intended to replace advice given to you by your health care provider. Make sure you discuss any questions you have with your health care provider. Document Released: 08/05/2009 Document Revised: 03/16/2016 Document Reviewed: 11/13/2014 Elsevier Interactive Patient Education  2017 [place].  Opioid Pain Medicine Management Opioids are powerful medicines that are used to treat moderate to severe pain. When used for short periods of time, they can help you to: Sleep better. Do better in physical or occupational therapy. Feel better in the first few days after an injury. Recover from surgery. Opioids should be taken with the supervision of a trained health care provider. They should be taken for the shortest period of time possible. This is because opioids can be addictive, and the longer you take opioids, the greater your risk of addiction. This addiction can also be called opioid use disorder. What are the risks? Using opioid pain medicines for longer than 3 days increases your risk of side effects. Side effects include: Constipation. Nausea and vomiting. Breathing difficulties (respiratory  depression). Drowsiness. Confusion. Opioid use disorder. Itching. Taking opioid pain medicine for a long period of time can affect your ability to do daily tasks. It also puts you at risk for: Motor vehicle crashes. Depression. Suicide. Heart attack. Overdose, which can be life-threatening. What is a pain treatment plan? A pain treatment plan is an agreement between you and your health care provider. Pain is unique to each person, and treatments vary depending on your condition. To manage your pain, you and your  health care provider need to work together. To help you do this: Discuss the goals of your treatment, including how much pain you might expect to have and how you will manage the pain. Review the risks and benefits of taking opioid medicines. Remember that a good treatment plan uses more than one approach and minimizes the chance of side effects. Be honest about the amount of medicines you take and about any drug or alcohol use. Get pain medicine prescriptions from only one health care provider. Pain can be managed with many types of alternative treatments. Ask your health care provider to refer you to one or more specialists who can help you manage pain through: Physical or occupational therapy. Counseling (cognitive behavioral therapy). Good nutrition. Biofeedback. Massage. Meditation. Non-opioid medicine. Following a gentle exercise program. How to use opioid pain medicine Taking medicine Take your pain medicine exactly as told by your health care provider. Take it only when you need it. If your pain gets less severe, you may take less than your prescribed dose if your health care provider approves. If you are not having pain, do nottake pain medicine unless your health care provider tells you to take it. If your pain is severe, do nottry to treat it yourself by taking more pills than instructed on your prescription. Contact your health care provider for help. Write down  the times when you take your pain medicine. It is easy to become confused while on pain medicine. Writing the time can help you avoid overdose. Take other over-the-counter or prescription medicines only as told by your health care provider. Keeping yourself and others safe  While you are taking opioid pain medicine: Do not drive, use machinery, or power tools. Do not sign legal documents. Do not drink alcohol. Do not take sleeping pills. Do not supervise children by yourself. Do not do activities that require climbing or being in high places. Do not go to a lake, river, ocean, spa, or swimming pool. Do not share your pain medicine with anyone. Keep pain medicine in a locked cabinet or in a secure area where pets and children cannot reach it. Stopping your use of opioids If you have been taking opioid medicine for more than a few weeks, you may need to slowly decrease (taper) how much you take until you stop completely. Tapering your use of opioids can decrease your risk of symptoms of withdrawal, such as: Pain and cramping in the abdomen. Nausea. Sweating. Sleepiness. Restlessness. Uncontrollable shaking (tremors). Cravings for the medicine. Do not attempt to taper your use of opioids on your own. Talk with your health care provider about how to do this. Your health care provider may prescribe a step-down schedule based on how much medicine you are taking and how long you have been taking it. Getting rid of leftover pills Do not save any leftover pills. Get rid of leftover pills safely by: Taking the medicine to a prescription take-back program. This is usually offered by the county or law enforcement. Bringing them to a pharmacy that has a drug disposal container. Flushing them down the toilet. Check the label or package insert of your medicine to see whether this is safe to do. Throwing them out in the trash. Check the label or package insert of your medicine to see whether this is  safe to do. If it is safe to throw it out, remove the medicine from the original container, put it into a sealable bag or container, and mix it with used  coffee grounds, food scraps, dirt, or cat litter before putting it in the trash. Follow these instructions at home: Activity Do exercises as told by your health care provider. Avoid activities that make your pain worse. Return to your normal activities as told by your health care provider. Ask your health care provider what activities are safe for you. General instructions You may need to take these actions to prevent or treat constipation: Drink enough fluid to keep your urine pale yellow. Take over-the-counter or prescription medicines. Eat foods that are high in fiber, such as beans, whole grains, and fresh fruits and vegetables. Limit foods that are high in fat and processed sugars, such as fried or sweet foods. Keep all follow-up visits. This is important. Where to find support If you have been taking opioids for a long time, you may benefit from receiving support for quitting from a local support group or counselor. Ask your health care provider for a referral to these resources in your area. Where to find more information Centers for Disease Control and Prevention (CDC): http://www.wolf.info/ [place] Food and Drug Administration (FDA): [place] Get help right away if: You may have taken too much of an opioid (overdosed). Common symptoms of an overdose: Your breathing is slower or more shallow than normal. You have a very slow heartbeat (pulse). You have slurred speech. You have nausea and vomiting. Your pupils become very small. You have other potential symptoms: You are very confused. You faint or feel like you will faint. You have cold, clammy skin. You have blue lips or fingernails. You have thoughts of harming yourself or harming others. These symptoms may represent a serious problem that is an emergency. Do not wait to see if the  symptoms will go away. Get medical help right away. Call your local emergency services (911 in the [place] Do not drive yourself to the hospital.  If you ever feel like you may hurt yourself or others, or have thoughts about taking your own life, get help right away. Go to your nearest emergency department or: Call your local emergency services (911 in the [place] Call the [facility] 2394273088 in the [place] Call a suicide crisis helpline, such as the [place] at (226)471-3930 or 988 in the [place]. This is open 24 hours a day in the [place] Text the Crisis Text Line at 989-503-7044 (in the [place].). Summary Opioid medicines can help you manage moderate to severe pain for a short period of time. A pain treatment plan is an agreement between you and your health care provider. Discuss the goals of your treatment, including how much pain you might expect to have and how you will manage the pain. If you think that you or someone else may have taken too much of an opioid, get medical help right away. This information is not intended to replace advice given to you by your health care provider. Make sure you discuss any questions you have with your health care provider. Document Revised: 05/04/2021 Document Reviewed: 01/19/2021 Elsevier Patient Education  [place].

## 2021-09-23 NOTE — Progress Notes (Signed)
Subjective:   Matthew Turner is a 60 y.o. male who presents for Medicare Annual/Subsequent preventive examination.  Review of Systems    No ROS.  Medicare Wellness Virtual Visit.  Visual/audio telehealth visit, UTA vital signs.   See social history for additional risk factors.   Cardiac Risk Factors include: advanced age (>55men, >33 women);male gender     Objective:    Today's Vitals   09/23/21 0825  Weight: 232 lb (105.2 kg)  Height: 5\' 10"  (1.778 m)   Body mass index is 33.29 kg/m.  Advanced Directives 09/23/2021 09/22/2020 02/23/2020 02/23/2020 09/22/2019 08/18/2019  Does Patient Have a Medical Advance Directive? No No No No No No  Would patient like information on creating a medical advance directive? No - Patient declined No - Patient declined No - Patient declined - Yes (MAU/Ambulatory/Procedural Areas - Information given) -    Current Medications (verified) Outpatient Encounter Medications as of 09/23/2021  Medication Sig   albuterol (VENTOLIN HFA) 108 (90 Base) MCG/ACT inhaler Inhale 1-2 puffs into the lungs every 6 (six) hours as needed for wheezing or shortness of breath.   Ascorbic Acid (VITAMIN C) 1000 MG tablet Take 1,000 mg by mouth daily.    atorvastatin (LIPITOR) 20 MG tablet Take 1 tablet (20 mg total) by mouth daily.   Azelastine-Fluticasone (DYMISTA) 137-50 MCG/ACT SUSP Place 1 spray into both nostrils 2 (two) times daily as needed.   B Complex Vitamins (VITAMIN B-COMPLEX) TABS Take 1 tablet by mouth daily in the afternoon.    bimatoprost (LUMIGAN) 0.01 % SOLN Place 1 drop into both eyes at bedtime. 1 drop affected eye   Cyanocobalamin (B-12) 500 MCG TABS Take 1,000 mcg by mouth daily.   gabapentin (NEURONTIN) 800 MG tablet Take 1 tablet (800 mg total) by mouth 3 (three) times daily.   hydrocortisone 2.5 % lotion APPLY TO NECK TWICE A DAY AS DIRECTED   losartan-hydrochlorothiazide (HYZAAR) 50-12.5 MG tablet Take 1 tablet by mouth daily. In am D/c losartan 50  mg alone   Polyethylene Glycol 400 (BLINK TEARS OP) Apply to eye.   Quercetin 250 MG TABS Take 1 tablet (250 mg total) by mouth in the morning and at bedtime.   Vitamin D, Ergocalciferol, 50 MCG (2000 UT) CAPS Take 4,000 Units by mouth daily.   XTAMPZA ER 9 MG C12A TAKE ONE CAPSULE BY MOUTH EVERY 12 HOURS   Zinc 100 MG TABS Take 1 tablet (100 mg total) by mouth daily.   No facility-administered encounter medications on file as of 09/23/2021.    Allergies (verified) Percocet [oxycodone-acetaminophen]   History: Past Medical History:  Diagnosis Date   Asthma    dust   Cancer ([place])    prostate; early 67s    Chronic pain    f/u pain clinic in [place]; arms, neck, back-pain clinic in [place]   COVID-19    02/17/20 hosp 5/3-02/27/20 covid pneumonia   Glaucoma    HLD (hyperlipidemia)    Hypertension    Spinal cord injury, C5-C7 ([place])    C6.C7 in 2008 injury at work he was paralyzed from waist down but walking again;workers comp   Past Surgical History:  Procedure Laterality Date   COLONOSCOPY WITH PROPOFOL N/A 08/18/2019   Procedure: COLONOSCOPY WITH PROPOFOL;  Surgeon: Lin Landsman, MD;  Location: [facility] ENDOSCOPY;  Service: Gastroenterology;  Laterality: N/A;   LUMBAR LAMINECTOMY     PROSTATECTOMY     had radiation no injections h/o prostate pump    Family History  Problem Relation Age of Onset   Heart failure Mother    Cancer Father        prostate   Dementia Father    Cancer Brother        prostate   Prostate cancer Brother    Pancreatic cancer Paternal Grandmother    Prostate cancer Other    Prostate cancer Brother        prostate   Dementia Sister    Social History   Socioeconomic History   Marital status: Married    Spouse name: Not on file   Number of children: Not on file   Years of education: Not on file   Highest education level: Not on file  Occupational History   Not on file  Tobacco Use   Smoking status: Former   Smokeless tobacco: Never  Substance and  Sexual Activity   Alcohol use: Yes    Comment: Occasionally    Drug use: Not on file   Sexual activity: Not on file  Other Topics Concern   Not on file  Social History Narrative   Former smoker quit 38    Occasional etoh    Married with 2 sons and 1 daughter    From [place]   Retired    Social Determinants of Radio broadcast assistant Strain: Low Risk    Difficulty of Paying Living Expenses: Not hard at all  Food Insecurity: No Food Insecurity   Worried About Charity fundraiser in the Last Year: Never true   Arboriculturist in the Last Year: Never true  Transportation Needs: No Transportation Needs   Lack of Transportation (Medical): No   Lack of Transportation (Non-Medical): No  Physical Activity: Insufficiently Active   Days of Exercise per Week: 4 days   Minutes of Exercise per Session: 20 min  Stress: No Stress Concern Present   Feeling of Stress : Not at all  Social Connections: Unknown   Frequency of Communication with Friends and Family: Not on file   Frequency of Social Gatherings with Friends and Family: Not on file   Attends Religious Services: Not on Electrical engineer or Organizations: Not on file   Attends Archivist Meetings: Not on file   Marital Status: Married    Tobacco Counseling Counseling given: Not Answered   Clinical Intake:  Pre-visit preparation completed: Yes        Diabetes: No  How often do you need to have someone help you when you read instructions, pamphlets, or other written materials from your doctor or pharmacy?: 1 - Never    Interpreter Needed?: No      Activities of Daily Living In your present state of health, do you have any difficulty performing the following activities: 09/23/2021  Hearing? N  Vision? N  Difficulty concentrating or making decisions? N  Walking or climbing stairs? Y  Comment Chronic back/leg pain/weakness  Dressing or bathing? N  Doing errands, shopping? N  Preparing Food  and eating ? N  Using the Toilet? N  In the past six months, have you accidently leaked urine? N  Do you have problems with loss of bowel control? N  Managing your Medications? N  Managing your Finances? N  Housekeeping or managing your Housekeeping? N  Comment Paces self.  Some recent data might be hidden    Patient Care Team: McLean-Scocuzza, Nino Glow, MD as PCP - General (Internal Medicine)  Indicate any recent Medical  Services you may have received from other than [facility] providers in the past year (date may be approximate).     Assessment:   This is a routine wellness examination for [place].  Virtual Visit via Telephone Note  I connected with  Matthew Turner on 09/23/21 at  8:15 AM EST by telephone and verified that I am speaking with the correct person using two identifiers.  Location: Patient: home Provider: office Persons participating in the virtual visit: patient/Nurse Health Advisor   I discussed the limitations, risks, security and privacy concerns of performing an evaluation and management service by telephone and the availability of in person appointments. The patient expressed understanding and agreed to proceed.  Interactive audio and video telecommunications were attempted between this nurse and patient, however failed, due to patient having technical difficulties OR patient did not have access to video capability.  We continued and completed visit with audio only.  Some vital signs may be absent or patient reported.   Hearing/Vision screen Hearing Screening - Comments:: Patient is able to hear conversational tones without difficulty.  No issues reported. Vision Screening - Comments:: Followed by [facility] Wears corrective lenses  They have seen their ophthalmologist in the last 6 months.  Glaucoma  Dietary issues and exercise activities discussed: Current Exercise Habits: Home exercise routine, Type of exercise: stretching (Bed/chair exercises.),  Time (Minutes): 20, Frequency (Times/Week): 4, Weekly Exercise (Minutes/Week): 80, Intensity: Mild Regular diet Good water intake    Goals Addressed               This Visit's Progress     Patient Stated     Follow up with Primary Care Provider (pt-stated)        Schedule dental exam       Increase physical activity (pt-stated)        Increase strength through physical therapy       Depression Screen PHQ 2/9 Scores 09/23/2021 09/22/2020 05/05/2020 03/09/2020 09/22/2019  PHQ - 2 Score 0 0 0 1 0    Fall Risk Fall Risk  09/23/2021 04/07/2021 10/07/2020 09/22/2020 05/05/2020  Falls in the past year? 0 0 0 0 0  Number falls in past yr: - 0 0 0 0  Injury with Fall? - 0 0 0 0  Follow up Falls evaluation completed Falls evaluation completed Falls evaluation completed Falls evaluation completed Falls evaluation completed    [place]: Home free of loose throw rugs in walkways, pet beds, electrical cords, etc? Yes  Adequate lighting in your home to reduce risk of falls? Yes   ASSISTIVE DEVICES UTILIZED TO PREVENT FALLS: Life alert? No  Use of a cane, walker or w/c? No  Grab bars in the bathroom? No  Shower chair or bench in shower? No  Elevated toilet seat or a handicapped toilet? No   TIMED UP AND GO: Was the test performed? No .   Cognitive Function:  Patient is alert and oriented x3.  Denies difficulty focusing, making decisions.    6CIT Screen 09/23/2021 09/22/2019  What Year? 0 points 0 points  What month? 0 points 0 points  What time? 0 points 0 points  Count back from 20 2 points 0 points  Months in reverse 4 points 0 points  Repeat phrase 4 points 0 points  Total Score 10 0    Immunizations Immunization History  Administered Date(s) Administered   Influenza Inj Mdck Quad Pf 06/26/2019   Influenza,inj,Quad PF,6+ Mos 10/07/2020  PFIZER Comirnaty(Gray Top)Covid-19 Tri-Sucrose Vaccine 06/21/2020   PFIZER(Purple Top)SARS-COV-2  Vaccination 01/31/2020, 05/29/2020, 06/21/2020   Pfizer Covid-19 Vaccine Bivalent Booster 18yrs & up 08/12/2021   Pneumococcal Conjugate-13 11/11/2019   Tdap 06/02/2021   Zoster Recombinat (Shingrix) 08/12/2021    Flu Vaccine status: Due, Education has been provided regarding the importance of this vaccine. Advised may receive this vaccine at local pharmacy or Health Dept. Aware to provide a copy of the vaccination record if obtained from local pharmacy or Health Dept. Verbalized acceptance and understanding.  Pneumococcal vaccine status: Due, Education has been provided regarding the importance of this vaccine. Advised may receive this vaccine at local pharmacy or Health Dept. Aware to provide a copy of the vaccination record if obtained from local pharmacy or Health Dept. Verbalized acceptance and understanding.  Screening Tests Health Maintenance  Topic Date Due   INFLUENZA VACCINE  01/20/2022 (Originally 05/23/2021)   Pneumococcal Vaccine 69-15 Years old (2 - PPSV23 if available, else PCV20) 09/23/2022 (Originally 11/10/2020)   Zoster Vaccines- Shingrix (2 of 2) 10/07/2021   COLONOSCOPY (Pts 45-59yrs Insurance coverage will need to be confirmed)  08/17/2022   TETANUS/TDAP  06/03/2031   COVID-19 Vaccine  Completed   Hepatitis C Screening  Completed   HIV Screening  Completed   HPV VACCINES  Aged Out   Health Maintenance There are no preventive care reminders to display for this patient.  Colorectal cancer screening: Type of screening: Colonoscopy. Completed 08/18/19. Repeat every 3 years  Lung Cancer Screening: (Low Dose CT Chest recommended if Age 93-80 years, 30 pack-year currently smoking OR have quit w/in 15years.) does not qualify.   Vision Screening: Recommended annual ophthalmology exams for early detection of glaucoma and other disorders of the eye.  Dental Screening: Recommended annual dental exams for proper oral hygiene  Community Resource Referral / Chronic Care  Management: CRR required this visit?  No   CCM required this visit?  No      Plan:   Keep all routine maintenance appointments.   I have personally reviewed and noted the following in the patient's chart:   Medical and social history Use of alcohol, tobacco or illicit drugs  Current medications and supplements including opioid prescriptions. Patient is currently taking opioid prescriptions. Information provided to patient regarding non-opioid alternatives. Patient advised to discuss non-opioid treatment plan with their provider. Followed by pain management clinic. Functional ability and status Nutritional status Physical activity Advanced directives List of other physicians Hospitalizations, surgeries, and ER visits in previous 12 months Vitals Screenings to include cognitive, depression, and falls Referrals and appointments  In addition, I have reviewed and discussed with patient certain preventive protocols, quality metrics, and best practice recommendations. A written personalized care plan for preventive services as well as general preventive health recommendations were provided to patient.     Varney Biles, LPN   [id number]

## 2021-10-06 ENCOUNTER — Other Ambulatory Visit: Payer: Self-pay | Admitting: Internal Medicine

## 2021-10-06 DIAGNOSIS — L309 Dermatitis, unspecified: Secondary | ICD-10-CM

## 2021-10-18 ENCOUNTER — Ambulatory Visit: Payer: Medicare HMO | Admitting: Internal Medicine

## 2021-10-18 ENCOUNTER — Encounter: Payer: Self-pay | Admitting: Internal Medicine

## 2021-10-18 ENCOUNTER — Ambulatory Visit (INDEPENDENT_AMBULATORY_CARE_PROVIDER_SITE_OTHER): Payer: Medicare HMO

## 2021-10-18 ENCOUNTER — Other Ambulatory Visit: Payer: Self-pay

## 2021-10-18 VITALS — BP 122/88 | HR 80 | Temp 97.7°F | Ht 70.0 in | Wt 234.4 lb

## 2021-10-18 DIAGNOSIS — E559 Vitamin D deficiency, unspecified: Secondary | ICD-10-CM

## 2021-10-18 DIAGNOSIS — E538 Deficiency of other specified B group vitamins: Secondary | ICD-10-CM | POA: Diagnosis not present

## 2021-10-18 DIAGNOSIS — M5416 Radiculopathy, lumbar region: Secondary | ICD-10-CM | POA: Diagnosis not present

## 2021-10-18 DIAGNOSIS — M2391 Unspecified internal derangement of right knee: Secondary | ICD-10-CM

## 2021-10-18 DIAGNOSIS — G8929 Other chronic pain: Secondary | ICD-10-CM

## 2021-10-18 DIAGNOSIS — S14105S Unspecified injury at C5 level of cervical spinal cord, sequela: Secondary | ICD-10-CM

## 2021-10-18 DIAGNOSIS — Z0000 Encounter for general adult medical examination without abnormal findings: Secondary | ICD-10-CM | POA: Diagnosis not present

## 2021-10-18 DIAGNOSIS — Z125 Encounter for screening for malignant neoplasm of prostate: Secondary | ICD-10-CM

## 2021-10-18 DIAGNOSIS — R29898 Other symptoms and signs involving the musculoskeletal system: Secondary | ICD-10-CM | POA: Diagnosis not present

## 2021-10-18 DIAGNOSIS — E785 Hyperlipidemia, unspecified: Secondary | ICD-10-CM

## 2021-10-18 DIAGNOSIS — J069 Acute upper respiratory infection, unspecified: Secondary | ICD-10-CM

## 2021-10-18 DIAGNOSIS — M2392 Unspecified internal derangement of left knee: Secondary | ICD-10-CM

## 2021-10-18 DIAGNOSIS — I10 Essential (primary) hypertension: Secondary | ICD-10-CM

## 2021-10-18 DIAGNOSIS — Z8546 Personal history of malignant neoplasm of prostate: Secondary | ICD-10-CM

## 2021-10-18 DIAGNOSIS — R739 Hyperglycemia, unspecified: Secondary | ICD-10-CM

## 2021-10-18 DIAGNOSIS — Z1389 Encounter for screening for other disorder: Secondary | ICD-10-CM

## 2021-10-18 DIAGNOSIS — M5412 Radiculopathy, cervical region: Secondary | ICD-10-CM

## 2021-10-18 LAB — CBC WITH DIFFERENTIAL/PLATELET
Basophils Absolute: 0 10*3/uL (ref 0.0–0.1)
Basophils Relative: 0.5 % (ref 0.0–3.0)
Eosinophils Absolute: 0.2 10*3/uL (ref 0.0–0.7)
Eosinophils Relative: 3.9 % (ref 0.0–5.0)
HCT: 41.4 % (ref 39.0–52.0)
Hemoglobin: 13.7 g/dL (ref 13.0–17.0)
Lymphocytes Relative: 28.8 % (ref 12.0–46.0)
Lymphs Abs: 1.4 10*3/uL (ref 0.7–4.0)
MCHC: 33.1 g/dL (ref 30.0–36.0)
MCV: 89.8 fl (ref 78.0–100.0)
Monocytes Absolute: 0.3 10*3/uL (ref 0.1–1.0)
Monocytes Relative: 6.3 % (ref 3.0–12.0)
Neutro Abs: 2.9 10*3/uL (ref 1.4–7.7)
Neutrophils Relative %: 60.5 % (ref 43.0–77.0)
Platelets: 203 10*3/uL (ref 150.0–400.0)
RBC: 4.6 Mil/uL (ref 4.22–5.81)
RDW: 12.9 % (ref 11.5–15.5)
WBC: 4.7 10*3/uL (ref 4.0–10.5)

## 2021-10-18 LAB — COMPREHENSIVE METABOLIC PANEL
ALT: 26 U/L (ref 0–53)
AST: 20 U/L (ref 0–37)
Albumin: 4.5 g/dL (ref 3.5–5.2)
Alkaline Phosphatase: 73 U/L (ref 39–117)
BUN: 14 mg/dL (ref 6–23)
CO2: 31 mEq/L (ref 19–32)
Calcium: 9.4 mg/dL (ref 8.4–10.5)
Chloride: 100 mEq/L (ref 96–112)
Creatinine, Ser: 1.22 mg/dL (ref 0.40–1.50)
GFR: 63.56 mL/min (ref >60.00)
Glucose, Bld: 111 mg/dL — ABNORMAL HIGH (ref 70–99)
Potassium: 3.7 mEq/L (ref 3.5–5.1)
Sodium: 139 mEq/L (ref 135–145)
Total Bilirubin: 1.4 mg/dL — ABNORMAL HIGH (ref 0.2–1.2)
Total Protein: 7.1 g/dL (ref 6.0–8.3)

## 2021-10-18 LAB — LIPID PANEL
Cholesterol: 176 mg/dL (ref 0–200)
HDL: 55 mg/dL (ref >39.00)
LDL Cholesterol: 105 mg/dL — ABNORMAL HIGH (ref 0–99)
NonHDL: 121.04
Total CHOL/HDL Ratio: 3
Triglycerides: 79 mg/dL (ref 0.0–149.0)
VLDL: 15.8 mg/dL (ref 0.0–40.0)

## 2021-10-18 LAB — HEMOGLOBIN A1C: Hgb A1c MFr Bld: 5.7 % (ref 4.6–6.5)

## 2021-10-18 LAB — VITAMIN B12: Vitamin B-12: >1550 pg/mL — ABNORMAL HIGH (ref 211–911)

## 2021-10-18 LAB — VITAMIN D 25 HYDROXY (VIT D DEFICIENCY, FRACTURES): VITD: 30.29 ng/mL (ref 30.00–100.00)

## 2021-10-18 LAB — PSA: PSA: 0 ng/mL — ABNORMAL LOW (ref 0.10–4.00)

## 2021-10-18 MED ORDER — GABAPENTIN 800 MG PO TABS: 800.0000 mg | ORAL_TABLET | Freq: Four times a day (QID) | ORAL | Status: DC

## 2021-10-18 NOTE — Progress Notes (Addendum)
Chief Complaint  Patient presents with   Referral    Physical therapy    Cough    Productive cough ongoing since Wednesday 10/12/21. Wife was sick first and tested but all test have come back negative. Wife was diagnosed with upper respiratory infection. Patient started off with brown mucous that is now clear. Has not been tested.    Annual Exam   Annual  1. Knees locking, spi feeling weak in arms and b/l legs wants referral pt still f/u pain clinic Dr. Stephanie Coup  2. URI started weds with cough and chest congestion still productive cough was brown now clear wife was sick and grankids still has phelgm and cough and night    Review of Systems  Constitutional:  Negative for weight loss.  HENT:  Negative for hearing loss.   Eyes:  Negative for blurred vision.  Respiratory:  Positive for cough and sputum production. Negative for shortness of breath.   Cardiovascular:  Negative for chest pain.  Gastrointestinal:  Negative for abdominal pain and blood in stool.  Musculoskeletal:  Positive for back pain and joint pain.  Skin:  Negative for rash.  Neurological:  Negative for headaches.  Psychiatric/Behavioral:  Negative for depression.   Past Medical History:  Diagnosis Date   Asthma    dust   Cancer ([place])    prostate; early 13s    Chronic pain    f/u pain clinic in [place]; arms, neck, back-pain clinic in [place]   COVID-19    02/17/20 hosp 5/3-02/27/20 covid pneumonia   Glaucoma    HLD (hyperlipidemia)    Hypertension    Spinal cord injury, C5-C7 ([place])    C6.C7 in 2008 injury at work he was paralyzed from waist down but walking again;workers comp   Past Surgical History:  Procedure Laterality Date   COLONOSCOPY WITH PROPOFOL N/A 08/18/2019   Procedure: COLONOSCOPY WITH PROPOFOL;  Surgeon: Lin Landsman, MD;  Location: [facility] ENDOSCOPY;  Service: Gastroenterology;  Laterality: N/A;   LUMBAR LAMINECTOMY     PROSTATECTOMY     had radiation no injections h/o prostate pump    Family History   Problem Relation Age of Onset   Heart failure Mother    Cancer Father        prostate   Dementia Father    Cancer Brother        prostate   Prostate cancer Brother    Pancreatic cancer Paternal Grandmother    Prostate cancer Other    Prostate cancer Brother        prostate   Dementia Sister    Social History   Socioeconomic History   Marital status: Married    Spouse name: Not on file   Number of children: Not on file   Years of education: Not on file   Highest education level: Not on file  Occupational History   Not on file  Tobacco Use   Smoking status: Former   Smokeless tobacco: Never  Substance and Sexual Activity   Alcohol use: Yes    Comment: Occasionally    Drug use: Not on file   Sexual activity: Not on file  Other Topics Concern   Not on file  Social History Narrative   Former smoker quit 38    Occasional etoh    Married with 2 sons and 1 daughter    From [place]   Retired    Social Determinants of Radio broadcast assistant Strain: [place]  Difficulty of Paying Living Expenses: Not hard at all  Food Insecurity: No Food Insecurity   Worried About [place] in the Last Year: Never true   Ran Out of Food in the Last Year: Never true  Transportation Needs: No Transportation Needs   Lack of Transportation (Medical): No   Lack of Transportation (Non-Medical): No  Physical Activity: Insufficiently Active   Days of Exercise per Week: 4 days   Minutes of Exercise per Session: 20 min  Stress: No Stress Concern Present   Feeling of Stress : Not at all  Social Connections: Unknown   Frequency of Communication with Friends and Family: Not on file   Frequency of Social Gatherings with Friends and Family: Not on file   Attends Religious Services: Not on file   Active Member of Clubs or Organizations: Not on file   Attends Archivist Meetings: Not on file   Marital Status: Married  Human resources officer Violence: Not At Risk   Fear of Current  or Ex-Partner: No   Emotionally Abused: No   Physically Abused: No   Sexually Abused: No   Current Meds  Medication Sig   albuterol (VENTOLIN HFA) 108 (90 Base) MCG/ACT inhaler Inhale 1-2 puffs into the lungs every 6 (six) hours as needed for wheezing or shortness of breath.   Ascorbic Acid (VITAMIN C) 1000 MG tablet Take 1,000 mg by mouth daily.    atorvastatin (LIPITOR) 20 MG tablet Take 1 tablet (20 mg total) by mouth daily.   Azelastine-Fluticasone (DYMISTA) 137-50 MCG/ACT SUSP Place 1 spray into both nostrils 2 (two) times daily as needed.   B Complex Vitamins (VITAMIN B-COMPLEX) TABS Take 1 tablet by mouth daily in the afternoon.    bimatoprost (LUMIGAN) 0.01 % SOLN Place 1 drop into both eyes at bedtime. 1 drop affected eye   Cyanocobalamin (B-12) 500 MCG TABS Take 1,000 mcg by mouth daily.   hydrocortisone 2.5 % lotion APPLY TO NECK TWICE A DAY AS DIRECTED   losartan-hydrochlorothiazide (HYZAAR) 50-12.5 MG tablet Take 1 tablet by mouth daily. In am D/c losartan 50 mg alone   MOVANTIK 25 MG TABS tablet Take 25 mg by mouth daily.   Polyethylene Glycol 400 (BLINK TEARS OP) Apply to eye.   Quercetin 250 MG TABS Take 1 tablet (250 mg total) by mouth in the morning and at bedtime.   Vitamin D, Ergocalciferol, 50 MCG (2000 UT) CAPS Take 4,000 Units by mouth daily.   XTAMPZA ER 9 MG C12A TAKE ONE CAPSULE BY MOUTH EVERY 12 HOURS   Zinc 100 MG TABS Take 1 tablet (100 mg total) by mouth daily.   [DISCONTINUED] gabapentin (NEURONTIN) 800 MG tablet Take 1 tablet (800 mg total) by mouth 3 (three) times daily.   Allergies  Allergen Reactions   Percocet [Oxycodone-Acetaminophen]     Certain formulations itching     No results found for this or any previous visit (from the past 2160 hour(s)). Objective  Body mass index is 33.63 kg/m. Wt Readings from Last 3 Encounters:  10/18/21 234 lb 6.4 oz (106.3 kg)  09/23/21 232 lb (105.2 kg)  04/07/21 232 lb 6.4 oz (105.4 kg)   Temp Readings from  Last 3 Encounters:  10/18/21 97.7 F (36.5 C) (Temporal)  04/07/21 98.4 F (36.9 C) (Oral)  10/07/20 97.6 F (36.4 C) (Oral)   BP Readings from Last 3 Encounters:  10/18/21 122/88  04/07/21 122/88  10/07/20 130/80   Pulse Readings from Last 3 Encounters:  10/18/21 80  04/07/21 76  10/07/20 73    Physical Exam Vitals and nursing note reviewed.  Constitutional:      Appearance: Normal appearance. He is well-developed and well-groomed. He is obese.  HENT:     Head: Normocephalic and atraumatic.  Eyes:     Conjunctiva/sclera: Conjunctivae normal.     Pupils: Pupils are equal, round, and reactive to light.  Cardiovascular:     Rate and Rhythm: Normal rate and regular rhythm.     Heart sounds: Normal heart sounds.  Pulmonary:     Effort: Pulmonary effort is normal. No respiratory distress.     Breath sounds: Normal breath sounds.  Abdominal:     Tenderness: There is no abdominal tenderness.  Musculoskeletal:     Lumbar back: Tenderness present. Negative right straight leg raise test and negative left straight leg raise test.  Skin:    General: Skin is warm and moist.  Neurological:     General: No focal deficit present.     Mental Status: He is alert and oriented to person, place, and time. Mental status is at baseline.     Sensory: Sensation is intact.     Motor: Motor function is intact.     Coordination: Coordination is intact.     Gait: Gait is intact. Gait normal.  Psychiatric:        Attention and Perception: Attention and perception normal.        Mood and Affect: Mood and affect normal.        Speech: Speech normal.        Behavior: Behavior normal. Behavior is cooperative.        Thought Content: Thought content normal.        Cognition and Memory: Cognition and memory normal.        Judgment: Judgment normal.    Assessment  Plan  Annual physical exam See below   Cervical radiculopathy - Plan: Ambulatory referral to Physical Therapy  Lumbar  radiculopathy - Plan: Ambulatory referral to Physical Therapy,   Arm weakness - Plan: Ambulatory referral to Physical Therapy,  Weakness of both lower extremities - Plan: Ambulatory referral to Physical Therapy,   Essential hypertension - Plan: Comprehensive metabolic panel, Lipid panel, CBC w/Diff, Urinalysis, Routine w reflex microscopic On hyzaar 50-12.5 mg qd   Hyperlipidemia, unspecified hyperlipidemia type  History of prostate cancer PSA today  Hyperglycemia - Plan: Hemoglobin A1c  B12 deficiency - Plan: Vitamin B12  Vitamin D deficiency - Plan: Vitamin D (25 hydroxy)  Locking of both knees - Plan: DG Knee Complete 4 Views Right, DG Knee Complete 4 Views Left, Ambulatory referral to Physical Therapy  C5-C7 level spinal cord injury, sequela ([place]) - Plan: Ambulatory referral to Physical Therapy  Other chronic pain - Plan: gabapentin (NEURONTIN) 800 MG tablet   URI Zpack  Supportive care  HM Fasting labs today  Flu shot utd  prevnar utd  Pfizer 5/5 Tdap 06/02/21  Prevnar 20 prevnar 13  1/2 shingrix per pt had 2/2   Colonoscopy 08/18/19 tubular adenoma f/u in 3 years     Undetectable PSA 07/02/2019 h/o prostate cancer with h/o removal and radiation no injections Former smoker quit in 50s Occasional etoh   Given names of eye MD and dentists in the area Former PCP Dr. Genia Plants [place] in [place] [address] [place] received    EGD 09/25/10 esophagitis duodenitis mildly enlarged major papilla path neg h pylori chronic peptic duodentitis c/w  adenomatous change; GERD   EGD path 06/05/12 gastric antrum bx negative duodenum negative no H pylori    EGD 01/23/15 mild erythema in stomach   EGD 01/24/17 -see report scanned in  path 01/24/17 gastric antrum bx and duodenum bx negative no H pylori    Upper EUS 03/08/11 see report scanned in chart  Upper EUS 01/23/15 see report scanned in chart  Upper EUS 01/24/17 GB sludge, no gallstones, bile duct normal, hyperechoic foci and  strands in pancreas and lobularity but no parenchymal calcifications/pancreatic ductal dilatation noted, 2 small cysts in pancreas body 4-5 mm liver with 6 mm cyst no lymphadenopathy    Colonoscopy 08/02/13 hyperplastic polyp sigmoid colon and mild hyperplastic changes rectum   Colonoscopy 07/27/10 semi pedunculated polyp splenic flexure IH/EH   Colonoscopy 07/31/08 pedunculated polyp transverse colon few diminutive sessile polyps rectum diverticulosis IH/EH    rec health diet and exercise Provider: Dr. Olivia Mackie McLean-Scocuzza-Internal Medicine

## 2021-10-18 NOTE — Patient Instructions (Signed)
Knee Exercises Ask your health care provider which exercises are safe for you. Do exercises exactly as told by your health care provider and adjust them as directed. It is normal to feel mild stretching, pulling, tightness, or discomfort as you do these exercises. Stop right away if you feel sudden pain or your pain gets worse. Do not begin these exercises until told by your health care provider. Stretching and range-of-motion exercises These exercises warm up your muscles and joints and improve the movement and flexibility of your knee. These exercises also help to relieve pain and swelling. Knee extension, prone  Lie on your abdomen (prone position) on a bed. Place your left / right knee just beyond the edge of the surface so your knee is not on the bed. You can put a towel under your left / right thigh just above your kneecap for comfort. Relax your leg muscles and allow gravity to straighten your knee (extension). You should feel a stretch behind your left / right knee. Hold this position for __________ seconds. Scoot up so your knee is supported between repetitions. Repeat __________ times. Complete this exercise __________ times a day. Knee flexion, active  Lie on your back with both legs straight. If this causes back discomfort, bend your left / right knee so your foot is flat on the floor. Slowly slide your left / right heel back toward your buttocks. Stop when you feel a gentle stretch in the front of your knee or thigh (flexion). Hold this position for __________ seconds. Slowly slide your left / right heel back to the starting position. Repeat __________ times. Complete this exercise __________ times a day. Quadriceps stretch, prone  Lie on your abdomen on a firm surface, such as a bed or padded floor. Bend your left / right knee and hold your ankle. If you cannot reach your ankle or pant leg, loop a belt around your foot and grab the belt instead. Gently pull your heel toward your  buttocks. Your knee should not slide out to the side. You should feel a stretch in the front of your thigh and knee (quadriceps). Hold this position for __________ seconds. Repeat __________ times. Complete this exercise __________ times a day. Hamstring, supine  Lie on your back (supine position). Loop a belt or towel over the ball of your left / right foot. The ball of your foot is on the walking surface, right under your toes. Straighten your left / right knee and slowly pull on the belt to raise your leg until you feel a gentle stretch behind your knee (hamstring). Do not let your knee bend while you do this. Keep your other leg flat on the floor. Hold this position for __________ seconds. Repeat __________ times. Complete this exercise __________ times a day. Strengthening exercises These exercises build strength and endurance in your knee. Endurance is the ability to use your muscles for a long time, even after they get tired. Quadriceps, isometric This exercise strengthens the muscles in front of your thigh (quadriceps) without moving your knee joint (isometric). Lie on your back with your left / right leg extended and your other knee bent. Put a rolled towel or small pillow under your knee if told by your health care provider. Slowly tense the muscles in the front of your left / right thigh. You should see your kneecap slide up toward your hip or see increased dimpling just above the knee. This motion will push the back of the knee toward the floor.   For __________ seconds, hold the muscle as tight as you can without increasing your pain. Relax the muscles slowly and completely. Repeat __________ times. Complete this exercise __________ times a day. Straight leg raises This exercise strengthens the muscles in front of your thigh (quadriceps) and the muscles that move your hips (hip flexors). Lie on your back with your left / right leg extended and your other knee bent. Tense the  muscles in the front of your left / right thigh. You should see your kneecap slide up or see increased dimpling just above the knee. Your thigh may even shake a bit. Keep these muscles tight as you raise your leg 4-6 inches (10-15 cm) off the floor. Do not let your knee bend. Hold this position for __________ seconds. Keep these muscles tense as you lower your leg. Relax your muscles slowly and completely after each repetition. Repeat __________ times. Complete this exercise __________ times a day. Hamstring, isometric  Lie on your back on a firm surface. Bend your left / right knee about __________ degrees. Dig your left / right heel into the surface as if you are trying to pull it toward your buttocks. Tighten the muscles in the back of your thighs (hamstring) to "dig" as hard as you can without increasing any pain. Hold this position for __________ seconds. Release the tension gradually and allow your muscles to relax completely for __________ seconds after each repetition. Repeat __________ times. Complete this exercise __________ times a day. Hamstring curls If told by your health care provider, do this exercise while wearing ankle weights. Begin with __________lb / kg weights. Then increase the weight by 1 lb (0.5 kg) increments. Do not wear ankle weights that are more than __________lb / kg. Lie on your abdomen with your legs straight. Bend your left / right knee as far as you can without feeling pain. Keep your hips flat against the floor. Hold this position for __________ seconds. Slowly lower your leg to the starting position. Repeat __________ times. Complete this exercise __________ times a day. Squats This exercise strengthens the muscles in front of your thigh and knee (quadriceps). Stand in front of a table, with your feet and knees pointing straight ahead. You may rest your hands on the table for balance but not for support. Slowly bend your knees and lower your hips like you  are going to sit in a chair. Keep your weight over your heels, not over your toes. Keep your lower legs upright so they are parallel with the table legs. Do not let your hips go lower than your knees. Do not bend lower than told by your health care provider. If your knee pain increases, do not bend as low. Hold the squat position for __________ seconds. Slowly push with your legs to return to standing. Do not use your hands to pull yourself to standing. Repeat __________ times. Complete this exercise __________ times a day. Wall slides This exercise strengthens the muscles in front of your thigh and knee (quadriceps). Lean your back against a smooth wall or door, and walk your feet out 18-24 inches (46-61 cm) from it. Place your feet hip-width apart. Slowly slide down the wall or door until your knees bend __________ degrees. Keep your knees over your heels, not over your toes. Keep your knees in line with your hips. Hold this position for __________ seconds. Repeat __________ times. Complete this exercise __________ times a day. Straight leg raises, side-lying This exercise strengthens the muscles that rotate   the leg at the hip and move it away from your body (hip abductors). Lie on your side with your left / right leg in the top position. Lie so your head, shoulder, knee, and hip line up. You may bend your bottom knee to help you keep your balance. Roll your hips slightly forward so your hips are stacked directly over each other and your left / right knee is facing forward. Leading with your heel, lift your top leg 4-6 inches (10-15 cm). You should feel the muscles in your outer hip lifting. Do not let your foot drift forward. Do not let your knee roll toward the ceiling. Hold this position for __________ seconds. Slowly return your leg to the starting position. Let your muscles relax completely after each repetition. Repeat __________ times. Complete this exercise __________ times a  day. Straight leg raises, prone This exercise stretches the muscles that move your hips away from the front of the pelvis (hip extensors). Lie on your abdomen on a firm surface. You can put a pillow under your hips if that is more comfortable. Tense the muscles in your buttocks and lift your left / right leg about 4-6 inches (10-15 cm). Keep your knee straight as you lift your leg. Hold this position for __________ seconds. Slowly lower your leg to the starting position. Let your leg relax completely after each repetition. Repeat __________ times. Complete this exercise __________ times a day. This information is not intended to replace advice given to you by your health care provider. Make sure you discuss any questions you have with your health care provider. Document Revised: 06/21/2021 Document Reviewed: 06/21/2021 Elsevier Patient Education  2022 [employer]  

## 2021-10-19 LAB — URINALYSIS, ROUTINE W REFLEX MICROSCOPIC
Bilirubin Urine: NEGATIVE
Glucose, UA: NEGATIVE
Hgb urine dipstick: NEGATIVE
Ketones, ur: NEGATIVE
Leukocytes,Ua: NEGATIVE
Nitrite: NEGATIVE
Protein, ur: NEGATIVE
Specific Gravity, Urine: 1.021 (ref 1.001–1.035)
pH: 5.5 (ref 5.0–8.0)

## 2021-10-20 ENCOUNTER — Telehealth: Payer: Self-pay | Admitting: Internal Medicine

## 2021-10-20 MED ORDER — AZITHROMYCIN 250 MG PO TABS: ORAL_TABLET | ORAL | 0 refills | Status: AC

## 2021-10-20 NOTE — Telephone Encounter (Signed)
Patient was seen on 10/18/2021, at appointment they discussed his cough. Patient thought Dr Aundra Dubin was going to call in something for his symptoms. Patient stated he did not need any other refills at this time.

## 2021-10-20 NOTE — Telephone Encounter (Signed)
Sent zpack  Can try robitussin dm or mucinex dm  If has albuterol inhaler use this as well  Warm tea honey and lemon sugar free cough drops

## 2021-10-20 NOTE — Telephone Encounter (Signed)
LMTCB

## 2021-10-20 NOTE — Addendum Note (Signed)
Addended by: Orland Mustard on: 10/20/2021 09:42 AM   Modules accepted: Orders

## 2021-10-20 NOTE — Progress Notes (Signed)
LMTCB

## 2021-10-20 NOTE — Telephone Encounter (Signed)
Note from 10/18/21:  2. URI started weds with cough and chest congestion still productive cough was brown now clear wife was sick and grankids still has phelgm and cough and night   Did not see anything about sending in medications. Please advise

## 2021-10-26 ENCOUNTER — Other Ambulatory Visit (INDEPENDENT_AMBULATORY_CARE_PROVIDER_SITE_OTHER): Payer: Self-pay | Admitting: Vascular Surgery

## 2021-10-26 DIAGNOSIS — R6889 Other general symptoms and signs: Secondary | ICD-10-CM

## 2021-10-26 NOTE — Progress Notes (Signed)
MRN : [id number]  Matthew Turner is a 61 y.o. (1961/03/13) male who presents with chief complaint of check circulation.  History of Present Illness:   The patient presents to the office for evaluation of his lower extremity arterial circulation and review of the noninvasive studies.  He underwent a Quanta flow study by a visiting practitioner which was reported as abnormal.  Of note, the patient does have pain induced with walking as well as pain in both hands and radiating down the back of his legs but this has been present since 2008 when he suffered a C7 spinal cord injury.  When asked if it seems like his walking is gotten worse over the last year or so he says "maybe".   No reported rest pain symptoms of the forefoot and toes. No new ulcers or wounds have occurred.  There have been no significant changes to the patient's overall health care.  The patient denies amaurosis fugax or recent TIA symptoms. There are no recent neurological changes noted. The patient denies history of DVT, PE or superficial thrombophlebitis. The patient denies recent episodes of angina or shortness of breath.   ABI Rt= 1.23 and Lt= 1.23 with triphasic waveforms at the level of the ankle and the DP and PT distributions bilaterally.  No outpatient medications have been marked as taking for the 10/27/21 encounter (Appointment) with Delana Meyer, Dolores Lory, MD.    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    Chronic pain    f/u pain clinic in [place]; arms, neck, back-pain clinic in [place]   COVID-19    02/17/20 hosp 5/3-02/27/20 covid pneumonia   Glaucoma    HLD (hyperlipidemia)    Hypertension    Spinal cord injury, C5-C7 ([place])    C6.C7 in 2008 injury at work he was paralyzed from waist down but walking again;workers comp    Past Surgical History:  Procedure Laterality Date   COLONOSCOPY WITH PROPOFOL N/A 08/18/2019   Procedure: COLONOSCOPY WITH PROPOFOL;   Surgeon: Lin Landsman, MD;  Location: [facility] ENDOSCOPY;  Service: Gastroenterology;  Laterality: N/A;   LUMBAR LAMINECTOMY     PROSTATECTOMY     had radiation no injections h/o prostate pump     Social History Social History   Tobacco Use   Smoking status: Former   Smokeless tobacco: Never  Substance Use Topics   Alcohol use: Yes    Comment: Occasionally     Family History Family History  Problem Relation Age of Onset   Heart failure Mother    Cancer Father        prostate   Dementia Father    Cancer Brother        prostate   Prostate cancer Brother    Pancreatic cancer Paternal Grandmother    Prostate cancer Other    Prostate cancer Brother        prostate   Dementia Sister     Allergies  Allergen Reactions   Percocet [Oxycodone-Acetaminophen]     Certain formulations itching       REVIEW OF SYSTEMS (Negative unless checked)  Constitutional: [] Weight loss  [] Fever  [] Chills Cardiac: [] Chest pain   [] Chest pressure   [] Palpitations   [] Shortness of breath when laying flat   [] Shortness of breath with exertion. Vascular:  [x] Pain in legs with walking   [x] Pain in legs at rest  [] History of DVT   []   Phlebitis   [] Swelling in legs   [] Varicose veins   [] Non-healing ulcers Pulmonary:   [] Uses home oxygen   [] Productive cough   [] Hemoptysis   [] Wheeze  [] COPD   [] Asthma Neurologic:  [] Dizziness   [] Seizures   [] History of stroke   [] History of TIA  [] Aphasia   [] Vissual changes   [x] Weakness or numbness in arm   [x] Weakness or numbness in leg Musculoskeletal:   [] Joint swelling   [] Joint pain   [x] Low back pain Hematologic:  [] Easy bruising  [] Easy bleeding   [] Hypercoagulable state   [] Anemic Gastrointestinal:  [] Diarrhea   [] Vomiting  [] Gastroesophageal reflux/heartburn   [] Difficulty swallowing. Genitourinary:  [] Chronic kidney disease   [] Difficult urination  [] Frequent urination   [] Blood in urine Skin:  [] Rashes   [] Ulcers  Psychological:  [] History of  anxiety   []  History of major depression.  Physical Examination  There were no vitals filed for this visit. There is no height or weight on file to calculate BMI. Gen: WD/WN, NAD Head: [place]/AT, No temporalis wasting.  Ear/Nose/Throat: Hearing grossly intact, nares w/o erythema or drainage Eyes: PER, EOMI, sclera nonicteric.  Neck: Supple, no masses.  No bruit or JVD.  Pulmonary:  Good air movement, no audible wheezing, no use of accessory muscles.  Cardiac: RRR, normal S1, S2, no Murmurs. Vascular:   Vessel Right Left  Radial Palpable Palpable  PT Palpable Palpable  DP Palpable Palpable  Gastrointestinal: soft, non-distended. No guarding/no peritoneal signs.  Musculoskeletal: M/S 5/5 throughout.  No visible deformity.  Neurologic: CN 2-12 intact. Pain and light touch intact in extremities.  Symmetrical.  Speech is fluent. Motor exam as listed above. Psychiatric: Judgment intact, Mood & affect appropriate for pt's clinical situation. Dermatologic: No rashes or ulcers noted.  No changes consistent with cellulitis.   CBC Lab Results  Component Value Date   WBC 4.7 10/18/2021   HGB 13.7 10/18/2021   HCT 41.4 10/18/2021   MCV 89.8 10/18/2021   PLT 203.0 10/18/2021    BMET    Component Value Date/Time   NA 139 10/18/2021 0817   K 3.7 10/18/2021 0817   CL 100 10/18/2021 0817   CO2 31 10/18/2021 0817   GLUCOSE 111 (H) 10/18/2021 0817   BUN 14 10/18/2021 0817   CREATININE 1.22 10/18/2021 0817   CALCIUM 9.4 10/18/2021 0817   GFRNONAA >60 02/27/2020 0623   GFRAA >60 02/27/2020 6295   Estimated Creatinine Clearance: 76.6 mL/min (by C-G formula based on SCr of 1.22 mg/dL).  COAG No results found for: INR, PROTIME  Radiology DG Knee Complete 4 Views Left  Result Date: 10/18/2021 CLINICAL DATA:  Bilateral knee locking EXAM: LEFT KNEE - COMPLETE 4+ VIEW COMPARISON:  None. FINDINGS: No evidence of fracture, dislocation, or joint effusion. Minor spurring at the upper pole  patella. IMPRESSION: No acute finding or degenerative joint narrowing. Electronically Signed   By: Jorje Guild M.D.   On: 10/18/2021 08:33   DG Knee Complete 4 Views Right  Result Date: 10/18/2021 CLINICAL DATA:  Bilateral knee locking. EXAM: RIGHT KNEE - COMPLETE 4+ VIEW COMPARISON:  None. FINDINGS: There is no joint effusion identified. No acute fracture or dislocation identified. Mild patellofemoral joint space narrowing and marginal spur formation noted. Sharpening of the tibial spines. No radiopaque foreign bodies. IMPRESSION: 1. No acute findings. 2. Mild osteoarthritis. Electronically Signed   By: Kerby Moors M.D.   On: 10/18/2021 08:32     Assessment/Plan 1. PAD (peripheral artery disease) ([place])   Recommend:  The patient has atypical pain symptoms for pure atherosclerotic disease. However, even though his ABI was normal it is a resting study and his symptoms are most pronounced with ambulation.  Although given his history of spinal cord injury this is still the most likely source for his lower extremity pain it is possible he has a 50 to 60% stenosis within the iliac system that would not show up on a resting ABI.  When I discussed this with him he was very interested in obtaining a duplex ultrasound of the aorta iliac system.  I did inform him that if this is normal then we can be quite confident that his symptoms are pseudoclaudication and not vascular.  At that point referral to the spine service might be beneficial.  The patient should continue walking and begin a more formal exercise program. The patient should continue his antiplatelet therapy and aggressive treatment of the lipid abnormalities.  - VAS US AORTA/IVC/ILIACS; Future  2. Essential hypertension Continue antihypertensive medications as already ordered, these medications have been reviewed and there are no changes at this time.   3. Intermittent asthma without complication, unspecified asthma severity Continue  pulmonary medications and aerosols as already ordered, these medications have been reviewed and there are no changes at this time.    4. Hyperlipidemia, unspecified hyperlipidemia type Continue statin as ordered and reviewed, no changes at this time     Hortencia Pilar, MD  10/26/2021 10:32 AM

## 2021-10-27 ENCOUNTER — Ambulatory Visit (INDEPENDENT_AMBULATORY_CARE_PROVIDER_SITE_OTHER): Payer: Medicare HMO

## 2021-10-27 ENCOUNTER — Ambulatory Visit (INDEPENDENT_AMBULATORY_CARE_PROVIDER_SITE_OTHER): Payer: Medicare HMO | Admitting: Vascular Surgery

## 2021-10-27 ENCOUNTER — Encounter (INDEPENDENT_AMBULATORY_CARE_PROVIDER_SITE_OTHER): Payer: Self-pay | Admitting: Vascular Surgery

## 2021-10-27 VITALS — BP 113/78 | HR 76 | Resp 17 | Ht 70.0 in | Wt 232.0 lb

## 2021-10-27 DIAGNOSIS — R972 Elevated prostate specific antigen [PSA]: Secondary | ICD-10-CM | POA: Insufficient documentation

## 2021-10-27 DIAGNOSIS — R300 Dysuria: Secondary | ICD-10-CM | POA: Insufficient documentation

## 2021-10-27 DIAGNOSIS — E785 Hyperlipidemia, unspecified: Secondary | ICD-10-CM | POA: Diagnosis not present

## 2021-10-27 DIAGNOSIS — N529 Male erectile dysfunction, unspecified: Secondary | ICD-10-CM | POA: Insufficient documentation

## 2021-10-27 DIAGNOSIS — R351 Nocturia: Secondary | ICD-10-CM | POA: Insufficient documentation

## 2021-10-27 DIAGNOSIS — E232 Diabetes insipidus: Secondary | ICD-10-CM | POA: Insufficient documentation

## 2021-10-27 DIAGNOSIS — J4520 Mild intermittent asthma, uncomplicated: Secondary | ICD-10-CM

## 2021-10-27 DIAGNOSIS — R6889 Other general symptoms and signs: Secondary | ICD-10-CM | POA: Diagnosis not present

## 2021-10-27 DIAGNOSIS — I739 Peripheral vascular disease, unspecified: Secondary | ICD-10-CM | POA: Diagnosis not present

## 2021-10-27 DIAGNOSIS — I10 Essential (primary) hypertension: Secondary | ICD-10-CM | POA: Diagnosis not present

## 2021-10-27 DIAGNOSIS — R350 Frequency of micturition: Secondary | ICD-10-CM | POA: Insufficient documentation

## 2021-10-27 DIAGNOSIS — N318 Other neuromuscular dysfunction of bladder: Secondary | ICD-10-CM | POA: Insufficient documentation

## 2021-10-27 DIAGNOSIS — C61 Malignant neoplasm of prostate: Secondary | ICD-10-CM | POA: Insufficient documentation

## 2021-10-27 DIAGNOSIS — R3129 Other microscopic hematuria: Secondary | ICD-10-CM | POA: Insufficient documentation

## 2021-10-28 ENCOUNTER — Encounter (INDEPENDENT_AMBULATORY_CARE_PROVIDER_SITE_OTHER): Payer: Self-pay | Admitting: Vascular Surgery

## 2021-10-31 ENCOUNTER — Ambulatory Visit (INDEPENDENT_AMBULATORY_CARE_PROVIDER_SITE_OTHER): Payer: Medicare HMO

## 2021-10-31 ENCOUNTER — Other Ambulatory Visit: Payer: Self-pay

## 2021-10-31 DIAGNOSIS — I739 Peripheral vascular disease, unspecified: Secondary | ICD-10-CM | POA: Diagnosis not present

## 2021-11-10 ENCOUNTER — Other Ambulatory Visit: Payer: Self-pay | Admitting: Internal Medicine

## 2021-11-10 DIAGNOSIS — L309 Dermatitis, unspecified: Secondary | ICD-10-CM

## 2021-12-15 ENCOUNTER — Other Ambulatory Visit: Payer: Self-pay | Admitting: Internal Medicine

## 2021-12-15 DIAGNOSIS — H409 Unspecified glaucoma: Secondary | ICD-10-CM

## 2022-01-02 ENCOUNTER — Telehealth: Payer: Self-pay | Admitting: Internal Medicine

## 2022-01-02 NOTE — Telephone Encounter (Signed)
Pt called in stating he just reporting that he tested positive for Covid on Saturday, March 11. Pt stated that he went to [facility] to get Covid tested. Pt stated that he had mild symptoms. Pt stated that he is experiencing right now cough, sneezing, and mild headaches. Pt doesn't know if he wants medication or not. Pt requesting callback.  ?

## 2022-01-05 ENCOUNTER — Encounter: Payer: Self-pay | Admitting: Internal Medicine

## 2022-01-05 ENCOUNTER — Telehealth (INDEPENDENT_AMBULATORY_CARE_PROVIDER_SITE_OTHER): Payer: Medicare HMO | Admitting: Internal Medicine

## 2022-01-05 ENCOUNTER — Other Ambulatory Visit: Payer: Self-pay

## 2022-01-05 VITALS — Ht 70.0 in | Wt 222.0 lb

## 2022-01-05 DIAGNOSIS — U071 COVID-19: Secondary | ICD-10-CM | POA: Diagnosis not present

## 2022-01-05 MED ORDER — MOLNUPIRAVIR EUA 200MG CAPSULE: 4.0000 | ORAL_CAPSULE | Freq: Two times a day (BID) | ORAL | 0 refills | Status: AC

## 2022-01-05 NOTE — Progress Notes (Signed)
Virtual Visit via Video Note ? ?I connected with Matthew Turner ? on 01/05/22 at  1:20 PM EDT by a video enabled telemedicine application and verified that I am speaking with the correct person using two identifiers. ? Location patient: [place] ?Location provider:work or home office ?Persons participating in the virtual visit: patient, provider ? ?I discussed the limitations and requested verbal permission for telemedicine visit. The patient expressed understanding and agreed to proceed. ? ? ?HPI: ? ?Acute telemedicine visit for : ?Gathering socially 12/24/21 and had URI with sore/scratchy throat during this time but checked for covid formally [facility]/31/21 [facility] c/o cough with white phelgm and tastes salty, chest congestion denies sob, +h/a but when he had covid 01/2020 h/a was worse. Denies weakness like the first time he had covid he is able to taste but 1st time had covid loss of taste and now. No fever.  ?-Pertinent past medical history: see below ?-Pertinent medication allergies:No Active Allergies ?-COVID-19 vaccine status:  ?Immunization History  ?Administered Date(s) Administered  ? Influenza Inj Mdck Quad Pf 06/26/2019  ? Influenza,inj,Quad PF,6+ Mos 10/07/2020, 10/07/2021  ? PFIZER Comirnaty(Gray Top)Covid-19 Tri-Sucrose Vaccine 06/21/2020  ? PFIZER(Purple Top)SARS-COV-2 Vaccination 01/31/2020, 05/29/2020, 06/21/2020  ? PNEUMOCOCCAL CONJUGATE-20 10/07/2021  ? Pension scheme manager 102yr & up 08/12/2021  ? Pneumococcal Conjugate-13 11/11/2019  ? Tdap 06/02/2021  ? Zoster Recombinat (Shingrix) 08/12/2021  ? ? ? ?ROS: See pertinent positives and negatives per HPI. ? ?Past Medical History:  ?Diagnosis Date  ? Asthma   ? dust  ? [place]   ? prostate; early 533s  ? Chronic pain   [place]-19   ? 02/17/20 hosp 5/3-02/27/20 covid pneumonia  ? Glaucoma   ? HLD (hyperlipidemia)   ? Hypertension   ? Spinal cord injury, C5-C7 ([place]   ? C6.C7 in 2008  injury at work he was paralyzed from waist down but walking again;workers comp  ? ? ?Past Surgical History:  ?Procedure Laterality Date  ? COLONOSCOPY WITH PROPOFOL N/A 08/18/2019  ? Procedure: COLONOSCOPY WITH PROPOFOL;  Surgeon: VLin Landsman MD;  [facility];  Service: Gastroenterology;  Laterality: N/A;  ? LUMBAR LAMINECTOMY    ? PROSTATECTOMY    ? had radiation no injections h/o prostate pump   ? ? ? ?Current Outpatient Medications:  ?  albuterol (VENTOLIN HFA) 108 (90 Base) MCG/ACT inhaler, Inhale 1-2 puffs into the lungs every 6 (six) hours as needed for wheezing or shortness of breath., Disp: 18 g, Rfl: 11 ?  Ascorbic Acid (VITAMIN C) 1000 MG tablet, Take 1,000 mg by mouth daily. , Disp: , Rfl:  ?  atorvastatin (LIPITOR) 20 MG tablet, Take 1 tablet (20 mg total) by mouth daily., Disp: 90 tablet, Rfl: 3 ?  Azelastine-Fluticasone (DYMISTA) 137-50 MCG/ACT SUSP, Place 1 spray into both nostrils 2 (two) times daily as needed., Disp: 23 g, Rfl: 11 ?  B Complex Vitamins (VITAMIN B-COMPLEX) TABS, Take 1 tablet by mouth daily in the afternoon. , Disp: , Rfl:  ?  Cyanocobalamin (B-12) 500 MCG TABS, Take 1,000 mcg by mouth daily., Disp: 90 tablet, Rfl: 3 ?  gabapentin (NEURONTIN) 800 MG tablet, Take 1 tablet (800 mg total) by mouth in the morning, at noon, in the evening, and at bedtime. Per pain clinic (Patient taking differently: Take 400 mg by mouth. Per pain clinic), Disp: , Rfl:  ?  hydrocortisone 2.5 % lotion, APPLY TO NECK  TWICE A DAY AS DIRECTED, Disp: 118 mL, Rfl: 0 ?  losartan-hydrochlorothiazide (HYZAAR) 50-12.5 MG tablet, Take 1 tablet by mouth daily. In am D/c losartan 50 mg alone, Disp: 90 tablet, Rfl: 3 ?  LUMIGAN 0.01 % SOLN, INSTILL 1 DROP INTO BOTH EYES EVERY NIGHT AT BEDTIME, Disp: 7.5 mL, Rfl: 1 ?  molnupiravir EUA (LAGEVRIO) 200 mg CAPS capsule, Take 4 capsules (800 mg total) by mouth 2 (two) times daily for 5 days. With food, Disp: 40 capsule, Rfl: 0 ?  MOVANTIK 25 MG TABS  tablet, Take 25 mg by mouth daily., Disp: , Rfl:  ?  Polyethylene Glycol 400 (BLINK TEARS OP), Apply to eye., Disp: , Rfl:  ?  Quercetin 250 MG TABS, Take 1 tablet (250 mg total) by mouth in the morning and at bedtime., Disp: 180 tablet, Rfl: 3 ?  Vitamin D, Ergocalciferol, 50 MCG (2000 UT) CAPS, Take 4,000 Units by mouth daily., Disp: 180 capsule, Rfl: 3 ?  XTAMPZA ER 9 MG C12A, TAKE ONE CAPSULE BY MOUTH EVERY 12 HOURS, Disp: , Rfl:  ?  Zinc 100 MG TABS, Take 1 tablet (100 mg total) by mouth daily., Disp: 90 tablet, Rfl: 3 ? ?EXAM: ? ?VITALS per patient if applicable: ? ?GENERAL: alert, oriented, appears well and in no acute distress ? ?HEENT: atraumatic, conjunttiva clear, no obvious abnormalities on inspection of external nose and ears ? ?NECK: normal movements of the head and neck ? ?LUNGS: on inspection no signs of respiratory distress, breathing rate appears normal, no obvious gross SOB, gasping or wheezing ? ?CV: no obvious cyanosis ? ?MS: moves all visible extremities without noticeable abnormality ? ?PSYCH/NEURO: pleasant and cooperative, no obvious depression or anxiety, speech and thought processing grossly intact ? ?ASSESSMENT AND PLAN: ? ?Discussed the following assessment and plan: ? ?COVID-19 - Plan: DG Chest 2 View, molnupiravir EUA (LAGEVRIO) 200 mg CAPS capsule bid x 5 days  ?Cxr with h/o covid pna in 01/2020  ?Prn albuterol  ?If needing prescription strength medication we will need to make an appointment with a provider.  ?These are over the counter medication options:  ?Mucinex dm green label for cough or robitussin DM  ?Multivitamin or below vitamins  ?Vitamin C 1000 mg daily.  ?Vitamin D3 4000 Iu (units) daily.  ?Zinc 100 mg daily.  ?Quercetin 250-500 mg 2 times per day   ?Elderberry  ?Oil of oregano  ?cepacol or chloroseptic spray ?Warm salt water gargles +hydrogen peroxide ?Sugar free cough drops  ?Warm tea with honey and lemon  ?Hydration  ?Try to eat though you dont feel like it   ?Tylenol  or Advil  ?Nasal saline and Flonase 2 sprays nasal congestion  ?If sneezing/runny nose over the counter allergy pill claritin,allegra, zyrtec, xyzal ?Quarantine x 10-14 days 14 days preferred  ? ?Monitor pulse oximeter, buy from [facility] if oxygen is less than 90 please go to the hospital.  ?   ?   ?Are you feeling really sick? Shortness of breath, cough, chest pain?, dizziness? Confusion  ? If so let me know  ?If worsening, go to hospital or [facility] clinic Urgent care for further treatment.    ? ?-we discussed possible serious and likely etiologies, options for evaluation and workup, limitations of telemedicine visit vs in person visit, treatment, treatment risks and precautions. Pt is agreeable to treatment via telemedicine at this moment.  ? ?  ?I discussed the assessment and treatment plan with the patient. The patient was provided an opportunity to ask  questions and all were answered. The patient agreed with the plan and demonstrated an understanding of the instructions. ?  ? ? ?Nino Glow McLean-Scocuzza, MD   ?

## 2022-01-05 NOTE — Patient Instructions (Signed)

## 2022-01-05 NOTE — Telephone Encounter (Signed)
Patient tested positive for Covid on 3/8 and 3/11. States his only symptoms were headache, cough, and sneezing. Patient states he felt cold symptoms coming on before he tested positive for COVID and feels like he still has the cold although he feels better. Patient states he is having chest congestion and a ongoing cough.  ? ?Patient scheduled for a virtual visit this afternoon at 1:20.  ?

## 2022-01-18 NOTE — Progress Notes (Signed)
Patient scheduled for 05/09/22

## 2022-04-03 ENCOUNTER — Other Ambulatory Visit: Payer: Self-pay | Admitting: Internal Medicine

## 2022-04-03 DIAGNOSIS — E785 Hyperlipidemia, unspecified: Secondary | ICD-10-CM

## 2022-04-07 ENCOUNTER — Ambulatory Visit: Payer: Medicare HMO | Admitting: Internal Medicine

## 2022-04-07 ENCOUNTER — Encounter: Payer: Self-pay | Admitting: Internal Medicine

## 2022-04-07 VITALS — BP 130/84 | HR 96 | Temp 98.2°F | Resp 14 | Ht 70.0 in | Wt 230.6 lb

## 2022-04-07 DIAGNOSIS — R21 Rash and other nonspecific skin eruption: Secondary | ICD-10-CM | POA: Diagnosis not present

## 2022-04-07 DIAGNOSIS — L659 Nonscarring hair loss, unspecified: Secondary | ICD-10-CM | POA: Diagnosis not present

## 2022-04-07 DIAGNOSIS — L918 Other hypertrophic disorders of the skin: Secondary | ICD-10-CM

## 2022-04-07 DIAGNOSIS — R7303 Prediabetes: Secondary | ICD-10-CM

## 2022-04-07 DIAGNOSIS — Z1329 Encounter for screening for other suspected endocrine disorder: Secondary | ICD-10-CM

## 2022-04-07 DIAGNOSIS — L821 Other seborrheic keratosis: Secondary | ICD-10-CM

## 2022-04-07 DIAGNOSIS — I10 Essential (primary) hypertension: Secondary | ICD-10-CM

## 2022-04-07 MED ORDER — CLOTRIMAZOLE-BETAMETHASONE 1-0.05 % EX CREA: 1.0000 | TOPICAL_CREAM | Freq: Every day | CUTANEOUS | 0 refills | Status: AC

## 2022-04-07 NOTE — Patient Instructions (Addendum)
Appt 05/09/22 9:20 am to be fasting for labs  [facility] or Nicole Kindred PT   Dr. Lanier Prude   [address], [place], [place] [address] Hours:  Open ? Closes 3?PM Products and Services: [email or web address] Phone: [phone]  Seborrheic Keratosis back and side and right  A seborrheic keratosis is a common, noncancerous (benign) skin growth. These growths are velvety, waxy, rough, tan, brown, or black spots that appear on the skin. These skin growths can be flat or raised, and scaly. What are the causes? The cause of this condition is not known. What increases the risk? You are more likely to develop this condition if you: Have a family history of seborrheic keratosis. Are 50 or older. Are pregnant. Have had estrogen replacement therapy. What are the signs or symptoms? Symptoms of this condition include growths on the face, chest, shoulders, back, or other areas. These growths: Are usually painless, but may become irritated and itchy. Can be yellow, brown, black, or other colors. Are slightly raised or have a flat surface. Are sometimes rough or wart-like in texture. Are often velvety or waxy on the surface. Are round or oval-shaped. Often occur in groups, but may occur as a single growth. How is this diagnosed? This condition is diagnosed with a medical history and physical exam. A sample of the growth may be tested (skin biopsy). You may need to see a skin specialist (dermatologist). How is this treated? Treatment is not usually needed for this condition, unless the growths are irritated or bleed often. You may also choose to have the growths removed if you do not like their appearance. Most commonly, these growths are treated with a procedure in which liquid nitrogen is applied to "freeze" off the growth (cryosurgery). They may also be burned off with electricity (electrocautery) or removed by scraping (curettage). Follow these instructions at home: Watch your growth for  any changes. Keep all follow-up visits as told by your health care provider. This is important. Do not scratch or pick at the growth or growths. This can cause them to become irritated or infected. Contact a health care provider if: You suddenly have many new growths. Your growth bleeds, itches, or hurts. Your growth suddenly becomes larger or changes color. Summary A seborrheic keratosis is a common, noncancerous (benign) skin growth. Treatment is not usually needed for this condition, unless the growths are irritated or bleed often. Watch your growth for any changes. Contact a health care provider if you suddenly have many new growths or your growth suddenly becomes larger or changes color. Keep all follow-up visits as told by your health care provider. This is important. This information is not intended to replace advice given to you by your health care provider. Make sure you discuss any questions you have with your health care provider. Document Revised: 08/03/2021 Document Reviewed: 08/03/2021 Elsevier Patient Education  Pain[employer], Adult An insect bite can make your skin red, itchy, and swollen. An insect bite is different from an insect sting, which happens when an insect injects poison (venom) into the skin. Some insects can spread disease to people through a bite. However, most insect bites do not lead to disease and are not serious. What are the causes? Insects may bite for a variety of reasons, including: Hunger. To defend themselves. Insects that bite include: Spiders. Mosquitoes. Ticks. Fleas. Ants. Flies. Kissing bugs. Chiggers. What are the signs or symptoms? Symptoms of this condition include: Itching or pain in the bite area. Redness  and swelling in the bite area. An open wound (skin ulcer). In many cases, symptoms last for 2-4 days. In rare cases, a person may have a severe allergic reaction (anaphylactic reaction) to a bite. Symptoms of  an anaphylactic reaction may include: Feeling warm in the face (flushed). This may include redness. Itchy, red, swollen areas of skin (hives). Swelling of the eyes, lips, face, mouth, tongue, or throat. Difficulty breathing, speaking, or swallowing. Noisy breathing (wheezing). Dizziness or light-headedness. Fainting. Pain or cramping in the abdomen. Vomiting. Diarrhea. How is this diagnosed? This condition is usually diagnosed based on symptoms and a physical exam. How is this treated? Treatment is usually not needed. Symptoms often go away on their own. When treatment is recommended, it may involve: Applying a cream or lotion to the bite area. This treatment helps with itching. Taking an antibiotic medicine. This treatment is needed if the bite area gets infected. Getting a tetanus shot, if you are not up to date on this vaccine. Applying ice to the affected area. Allergy medicines called antihistamines. This treatment may be needed if you develop itching or an allergic reaction to the insect bite. Giving yourself an epinephrine injection if you have an anaphylactic reaction to a bite. To give the injection, you will use what is commonly called an auto-injector "pen" (pre-filled automatic epinephrine injection device). Your health care provider will teach you how to use an auto-injector pen. Follow these instructions at home: Bite area care  Do not scratch the bite area. Keep the bite area clean and dry. Wash it every day with soap and water as told by your health care provider. Check the bite area every day for signs of infection. Check for: Redness, swelling, or pain. Fluid or blood. Warmth. Pus or a bad smell. Managing pain, itching, and swelling  You may apply cortisone cream, calamine lotion, or a paste made of baking soda and water to the bite area as told by your health care provider. If directed, put ice on the bite area. Put ice in a plastic bag. Place a towel between  your skin and the bag. Leave the ice on for 20 minutes, 2-3 times a day. General instructions Apply or take over-the-counter and prescription medicines only as told by your health care provider. If you were prescribed an antibiotic medicine, take or apply it as told by your health care provider. Do not stop using the antibiotic even if your condition improves. Keep all follow-up visits as told by your health care provider. This is important. How is this prevented? To help reduce your risk of insect bites: When you are outdoors, wear clothing that covers your arms and legs. This is especially important in the early morning and evening. Use insect repellent. The best insect repellents contain DEET, picaridin, oil of lemon eucalyptus (OLE), or IR3535. Consider spraying your clothing with a pesticide called permethrin. Permethrin helps prevent insect bites. It works for several weeks and for up to 5-6 clothing washes. Do not apply permethrin directly to the skin. If your home windows do not have screens, consider installing them. If you will be sleeping in an area where there are mosquitoes, consider covering your sleeping area with a mosquito net. Contact a health care provider if: You have redness, swelling, or pain in the bite area. You have fluid or blood coming from the bite area. The bite area feels warm to the touch. You have pus or a bad smell coming from the bite area. You have a  fever. Get help right away if: You have joint pain. You have a rash. You feel unusually tired or sleepy. You have neck pain. You have a headache. You have unusual weakness. You develop symptoms of an anaphylactic reaction. These may include: Flushed skin. Hives. Swelling of the eyes, lips, face, mouth, tongue, or throat. Difficulty breathing, speaking, or swallowing. Wheezing. Dizziness or light-headedness. Fainting. Pain or cramping in the abdomen. Vomiting. Diarrhea. These symptoms may represent a  serious problem that is an emergency. Do not wait to see if the symptoms will go away. Do the following right away: Use the auto-injector pen as you have been instructed. Get medical help. Call your local emergency services (911 in the [place] Do not drive yourself to the hospital. Summary An insect bite can make your skin red, itchy, and swollen. Treatment is usually not needed. Symptoms often go away on their own. When treatment is recommended, it may involve taking medicine, applying medicine to the area, or applying ice. Apply or take over-the-counter and prescription medicines only as told by your health care provider. Use insect repellent to help prevent insect bites. Contact a health care provider if you have any signs of infection in the bite area. This information is not intended to replace advice given to you by your health care provider. Make sure you discuss any questions you have with your health care provider. Document Revised: 07/11/2021 Document Reviewed: 07/11/2021 Elsevier Patient Education  [place].

## 2022-04-07 NOTE — Progress Notes (Signed)
Chief Complaint  Patient presents with   Insect Bite    Pt concerned of poss bug bite in arms, states last wk he was in the bathroom and felt a sting, unaware of what could've bit him.    F/u  1. ? Insect bites b/l arms was in the bathroom at home last week and felt a sting but also had been working in the yard. He did not see an insect     Review of Systems  Constitutional:  Negative for weight loss.  HENT:  Negative for hearing loss.   Eyes:  Negative for blurred vision.  Respiratory:  Negative for shortness of breath.   Cardiovascular:  Negative for chest pain.  Gastrointestinal:  Negative for abdominal pain and blood in stool.  Musculoskeletal:  Negative for back pain.  Skin:  Negative for rash.  Neurological:  Negative for headaches.  Psychiatric/Behavioral:  Negative for depression.    Past Medical History:  Diagnosis Date   Asthma    dust   Cancer ([place])    prostate; early 62s    Chronic pain    f/u pain clinic in [place]; arms, neck, back-pain clinic in [place]   COVID-19    02/17/20 hosp 5/3-02/27/20 covid pneumonia, 12/31/21   Glaucoma    HLD (hyperlipidemia)    Hypertension    Spinal cord injury, C5-C7 ([place])    C6.C7 in 2008 injury at work he was paralyzed from waist down but walking again;workers comp   Past Surgical History:  Procedure Laterality Date   COLONOSCOPY WITH PROPOFOL N/A 08/18/2019   Procedure: COLONOSCOPY WITH PROPOFOL;  Surgeon: Lin Landsman, MD;  Location: [facility] ENDOSCOPY;  Service: Gastroenterology;  Laterality: N/A;   LUMBAR LAMINECTOMY     PROSTATECTOMY     had radiation no injections h/o prostate pump    Family History  Problem Relation Age of Onset   Heart failure Mother    Cancer Father        prostate   Dementia Father    Cancer Brother        prostate   Prostate cancer Brother    Pancreatic cancer Paternal Grandmother    Prostate cancer Other    Prostate cancer Brother        prostate   Dementia Sister    Social History    Socioeconomic History   Marital status: Married    Spouse name: Not on file   Number of children: Not on file   Years of education: Not on file   Highest education level: Not on file  Occupational History   Not on file  Tobacco Use   Smoking status: Former   Smokeless tobacco: Never  Substance and Sexual Activity   Alcohol use: Yes    Comment: Occasionally    Drug use: Not on file   Sexual activity: Not on file  Other Topics Concern   Not on file  Social History Narrative   Former smoker quit 38    Occasional etoh    Married with 2 sons and 1 daughter    From [place]   Retired    Social Determinants of Radio broadcast assistant Strain: [place]  (09/23/2021)   Overall Financial Resource Strain (CARDIA)    Difficulty of Paying Living Expenses: Not hard at all  Food Insecurity: No [place] (09/23/2021)   Hunger Vital Sign    Worried About Running Out of Food in the Last Year: Never true    Ran Out  of Food in the Last Year: Never true  Transportation Needs: No Transportation Needs (09/23/2021)   PRAPARE - Hydrologist (Medical): No    Lack of Transportation (Non-Medical): No  Physical Activity: Insufficiently Active (09/23/2021)   Exercise Vital Sign    Days of Exercise per Week: 4 days    Minutes of Exercise per Session: 20 min  Stress: No Stress Concern Present (09/23/2021)   [place]    Feeling of Stress : Not at all  Social Connections: Unknown (09/23/2021)   Social Connection and Isolation Panel [NHANES]    Frequency of Communication with Friends and Family: Not on file    Frequency of Social Gatherings with Friends and Family: Not on file    Attends Religious Services: Not on file    Active Member of Clubs or Organizations: Not on file    Attends Archivist Meetings: Not on file    Marital Status: Married  Intimate Partner Violence: Not At Risk  (09/23/2021)   Humiliation, Afraid, Rape, and Kick questionnaire    Fear of Current or Ex-Partner: No    Emotionally Abused: No    Physically Abused: No    Sexually Abused: No   Current Meds  Medication Sig   albuterol (VENTOLIN HFA) 108 (90 Base) MCG/ACT inhaler Inhale 1-2 puffs into the lungs every 6 (six) hours as needed for wheezing or shortness of breath.   atorvastatin (LIPITOR) 20 MG tablet TAKE 1 TABLET BY MOUTH EVERY DAY   Azelastine-Fluticasone (DYMISTA) 137-50 MCG/ACT SUSP Place 1 spray into both nostrils 2 (two) times daily as needed.   B Complex Vitamins (VITAMIN B-COMPLEX) TABS Take 1 tablet by mouth daily in the afternoon.    clotrimazole-betamethasone (LOTRISONE) cream Apply 1 Application topically daily.   Cyanocobalamin (B-12) 500 MCG TABS Take 1,000 mcg by mouth daily.   gabapentin (NEURONTIN) 800 MG tablet Take 1 tablet (800 mg total) by mouth in the morning, at noon, in the evening, and at bedtime. Per pain clinic (Patient taking differently: Take 400 mg by mouth. Per pain clinic)   hydrocortisone 2.5 % lotion APPLY TO NECK TWICE A DAY AS DIRECTED   losartan-hydrochlorothiazide (HYZAAR) 50-12.5 MG tablet TAKE 1 TABLET BY MOUTH DAILY IN THE MORNING. STOP PLAIN LOSARTAN 50   LUMIGAN 0.01 % SOLN INSTILL 1 DROP INTO BOTH EYES EVERY NIGHT AT BEDTIME   MOVANTIK 25 MG TABS tablet Take 25 mg by mouth daily.   Polyethylene Glycol 400 (BLINK TEARS OP) Apply to eye.   Vitamin D, Ergocalciferol, 50 MCG (2000 UT) CAPS Take 4,000 Units by mouth daily.   XTAMPZA ER 9 MG C12A TAKE ONE CAPSULE BY MOUTH EVERY 12 HOURS   Zinc 100 MG TABS Take 1 tablet (100 mg total) by mouth daily.   No Active Allergies No results found for this or any previous visit (from the past 2160 hour(s)). Objective  Body mass index is 33.09 kg/m. Wt Readings from Last 3 Encounters:  04/07/22 230 lb 9.6 oz (104.6 kg)  01/05/22 222 lb (100.7 kg)  10/27/21 232 lb (105.2 kg)   Temp Readings from Last 3  Encounters:  04/07/22 98.2 F (36.8 C) (Oral)  10/18/21 97.7 F (36.5 C) (Temporal)  04/07/21 98.4 F (36.9 C) (Oral)   BP Readings from Last 3 Encounters:  04/07/22 130/84  10/27/21 113/78  10/18/21 122/88   Pulse Readings from Last 3 Encounters:  04/07/22 96  10/27/21 76  10/18/21 80    Physical Exam Vitals and nursing note reviewed.  Constitutional:      Appearance: Normal appearance. He is well-developed and well-groomed.  HENT:     Head: Normocephalic and atraumatic.  Eyes:     Conjunctiva/sclera: Conjunctivae normal.     Pupils: Pupils are equal, round, and reactive to light.  Cardiovascular:     Rate and Rhythm: Normal rate and regular rhythm.     Heart sounds: Normal heart sounds.  Pulmonary:     Effort: Pulmonary effort is normal. No respiratory distress.     Breath sounds: Normal breath sounds.  Abdominal:     Tenderness: There is no abdominal tenderness.  Skin:    General: Skin is warm and moist.  Neurological:     General: No focal deficit present.     Mental Status: He is alert and oriented to person, place, and time. Mental status is at baseline.     Sensory: Sensation is intact.     Motor: Motor function is intact.     Coordination: Coordination is intact.     Gait: Gait is intact. Gait normal.  Psychiatric:        Attention and Perception: Attention and perception normal.        Mood and Affect: Mood and affect normal.        Speech: Speech normal.        Behavior: Behavior normal. Behavior is cooperative.        Thought Content: Thought content normal.        Cognition and Memory: Cognition and memory normal.        Judgment: Judgment normal.     Assessment  Plan  Hair loss - Plan: Ambulatory referral to Dermatology   Dermatosis papulosa nigra - Plan: Ambulatory referral to Dermatology,  Right cheek inflamed lesion  -consider removal with dermatology   Skin tag - Plan: Ambulatory referral to Dermatology  Seborrheic keratoses - Plan:  Ambulatory referral to Dermatology, CANCELED: Ambulatory referral to Dermatology  Rash - Plan: clotrimazole-betamethasone (LOTRISONE) cream b/l arms   HM Fasting labs today  Flu shot utd  prevnar utd  Pfizer 5/5 Tdap 06/02/21  Prevnar 20 prevnar 13  1/2 shingrix per pt had 2/2   Colonoscopy 08/18/19 tubular adenoma f/u in 3 years  Dr. Marius Ditch   Undetectable PSA 10/18/21 h/o prostate cancer with h/o removal and radiation no injections Former smoker quit in 35s Occasional etoh   Given names of eye MD and dentists in the area Former PCP Dr. Genia Plants [place] in [place] in [place] received    EGD 09/25/10 esophagitis duodenitis mildly enlarged major papilla path neg h pylori chronic peptic duodentitis c/w adenomatous change; GERD   EGD path 06/05/12 gastric antrum bx negative duodenum negative no H pylori    EGD 01/23/15 mild erythema in stomach   EGD 01/24/17 -see report scanned in  path 01/24/17 gastric antrum bx and duodenum bx negative no H pylori    Upper EUS 03/08/11 see report scanned in chart  Upper EUS 01/23/15 see report scanned in chart  Upper EUS 01/24/17 GB sludge, no gallstones, bile duct normal, hyperechoic foci and strands in pancreas and lobularity but no parenchymal calcifications/pancreatic ductal dilatation noted, 2 small cysts in pancreas body 4-5 mm liver with 6 mm cyst no lymphadenopathy    Colonoscopy 08/02/13 hyperplastic polyp sigmoid colon and mild hyperplastic changes rectum   Colonoscopy 07/27/10 semi pedunculated polyp splenic flexure IH/EH  Colonoscopy 07/31/08 pedunculated polyp transverse colon few diminutive sessile polyps rectum diverticulosis IH/EH    rec health diet and exercise  Has seen [facility]  PT  Provider: Dr. Olivia Mackie [facility]-Scocuz[facility]

## 2022-04-07 NOTE — Addendum Note (Signed)
Addended by: Orland Mustard on: 04/07/2022 12:10 PM   Modules accepted: Orders

## 2022-04-18 ENCOUNTER — Other Ambulatory Visit: Payer: Self-pay | Admitting: Internal Medicine

## 2022-04-18 DIAGNOSIS — L309 Dermatitis, unspecified: Secondary | ICD-10-CM

## 2022-04-19 IMAGING — DX DG KNEE COMPLETE 4+V*L*
5 series · 5 of 5 positions shown · non-contrast
Comparison: None.

CLINICAL DATA: Bilateral knee locking

EXAM:
LEFT KNEE - COMPLETE 4+ VIEW

[knee standing ap]
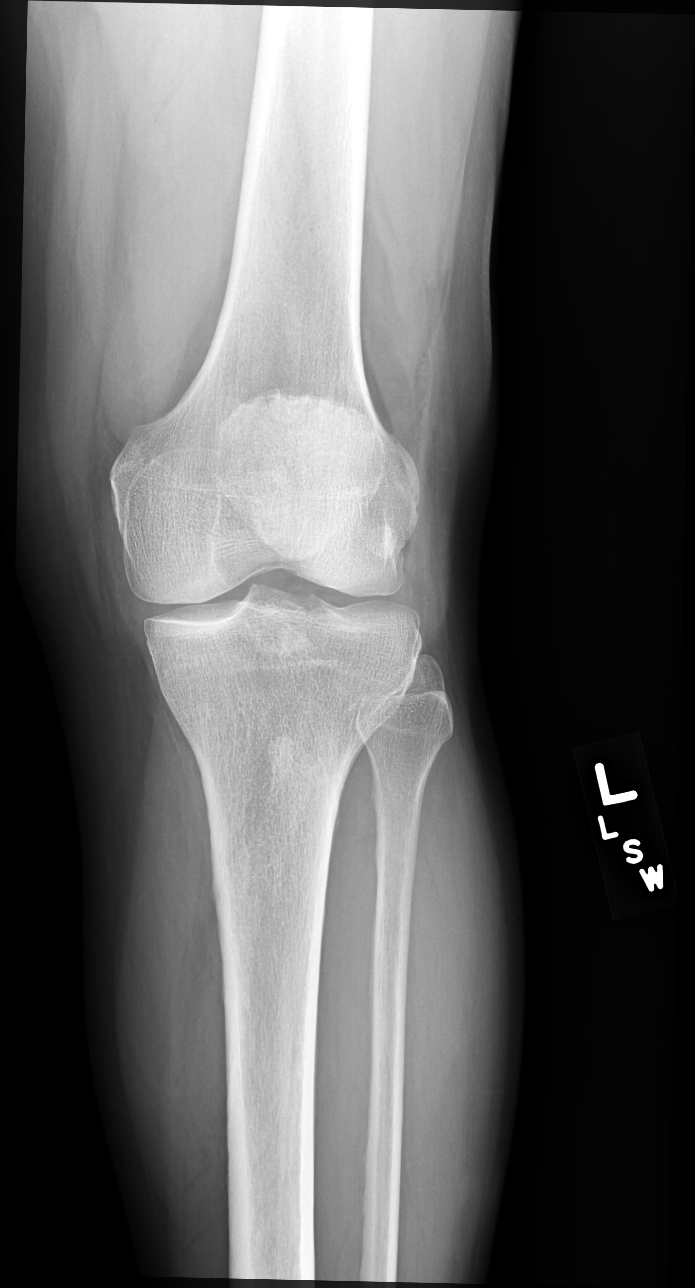

[knee standing external ap]
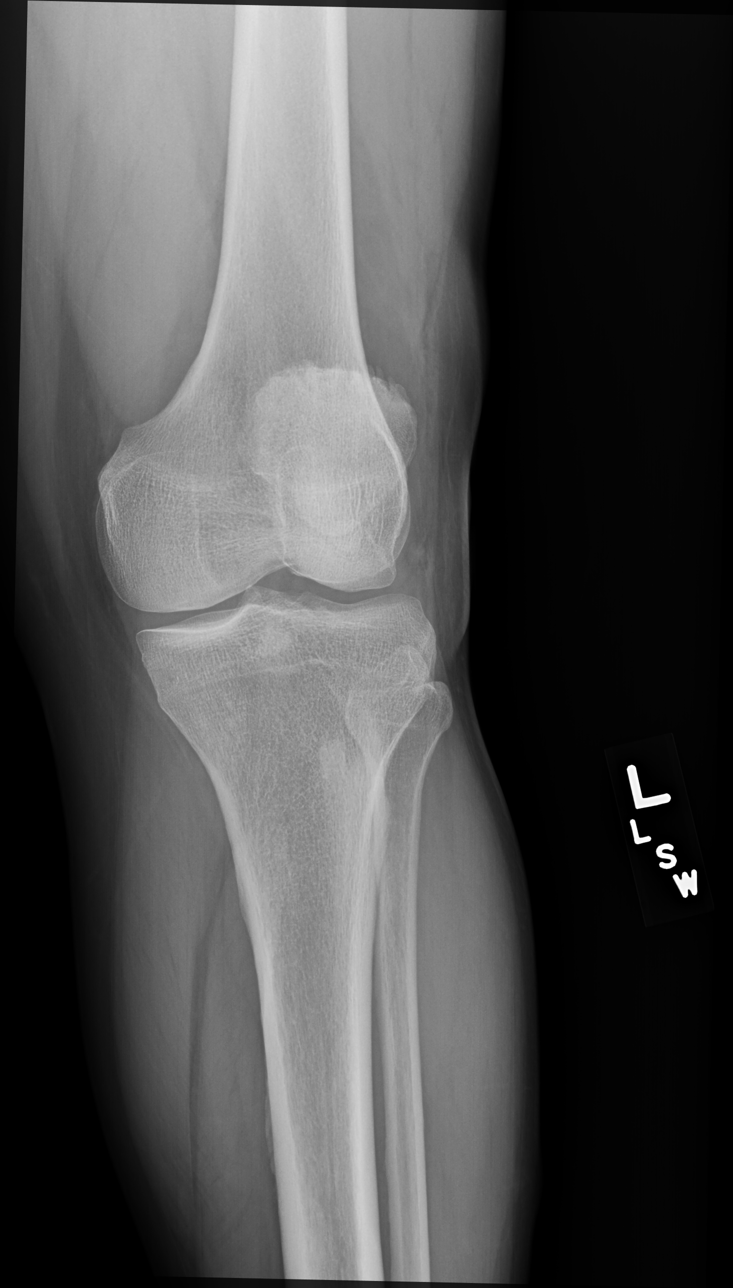

[knee standing internal ap]
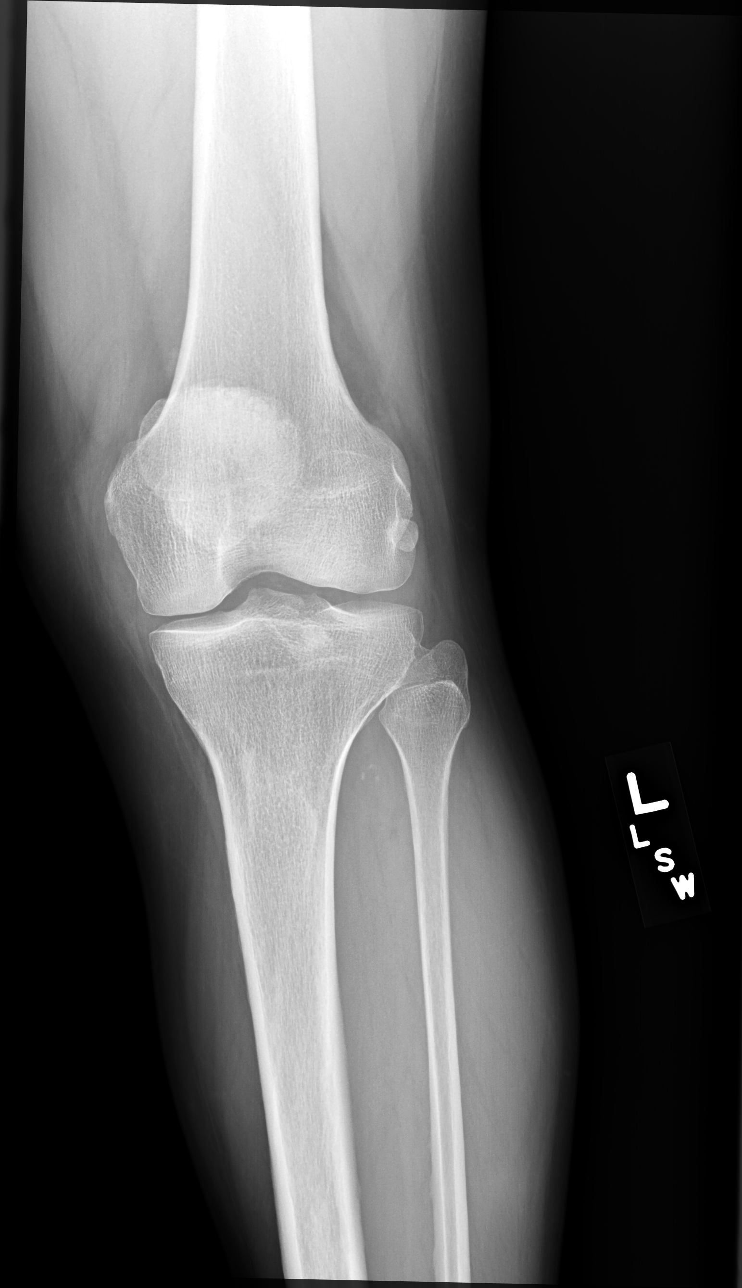

[knee standing lat]
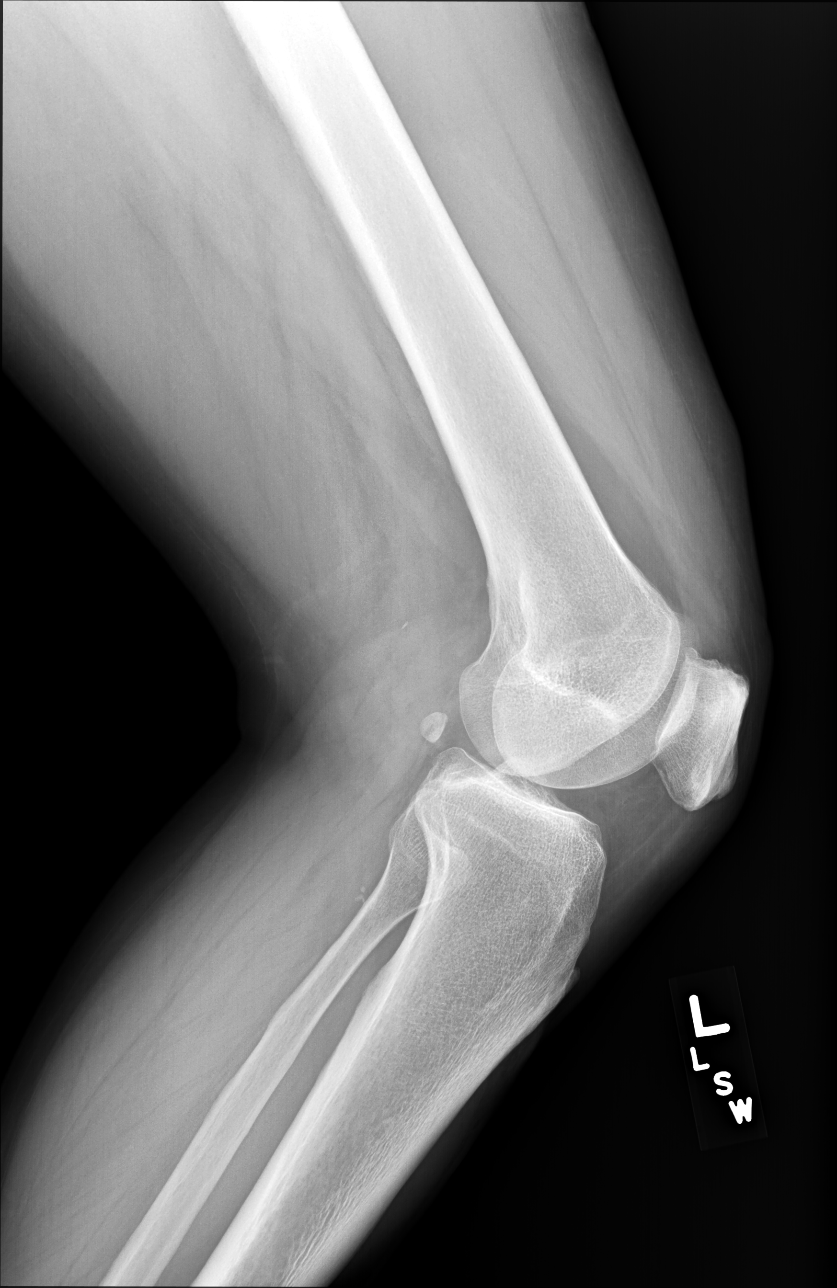

[knee [person_name] view pa]
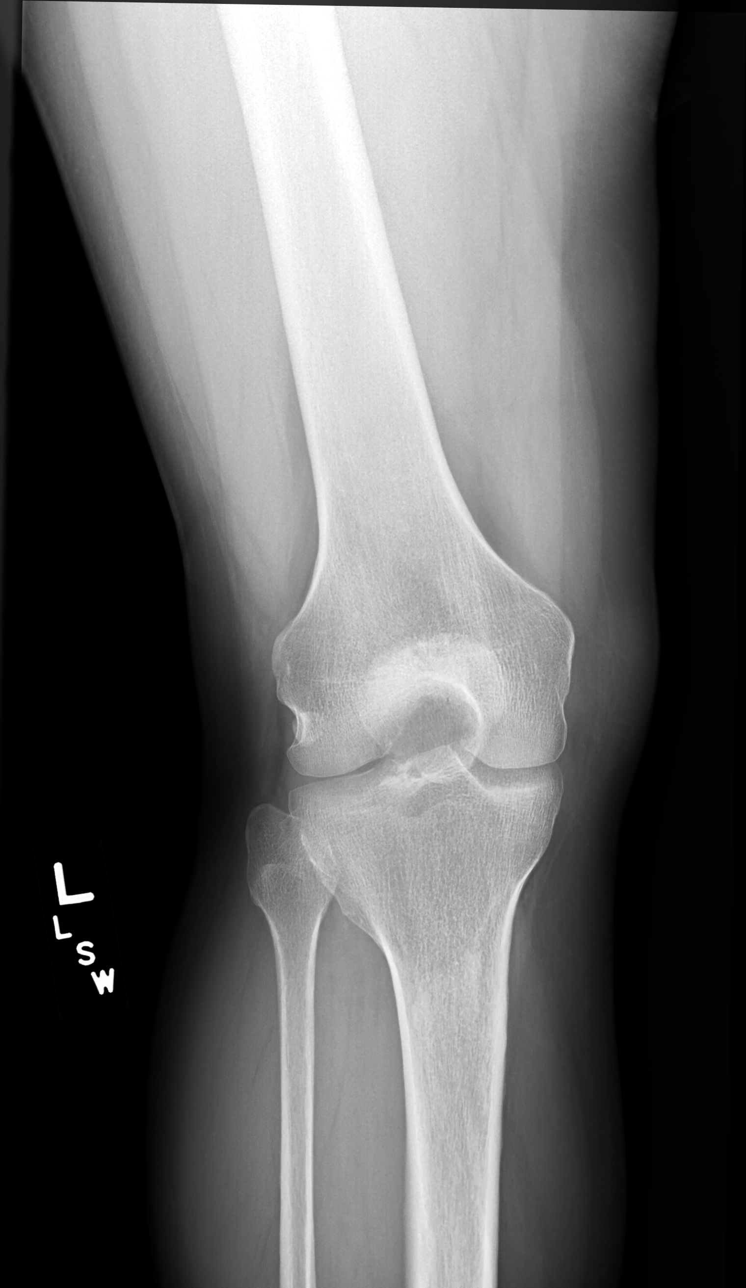

[5 of 5 positions shown; findings below may reference images not displayed]

FINDINGS: No evidence of fracture, dislocation, or joint effusion. Minor
spurring at the upper pole patella.
IMPRESSION: No acute finding or degenerative joint narrowing.

## 2022-05-09 ENCOUNTER — Telehealth: Payer: Self-pay

## 2022-05-09 ENCOUNTER — Ambulatory Visit (INDEPENDENT_AMBULATORY_CARE_PROVIDER_SITE_OTHER): Payer: Medicare HMO | Admitting: Internal Medicine

## 2022-05-09 ENCOUNTER — Encounter: Payer: Self-pay | Admitting: Internal Medicine

## 2022-05-09 VITALS — BP 124/76 | HR 71 | Temp 98.1°F | Ht 70.0 in | Wt 230.0 lb

## 2022-05-09 DIAGNOSIS — Z23 Encounter for immunization: Secondary | ICD-10-CM | POA: Diagnosis not present

## 2022-05-09 DIAGNOSIS — I10 Essential (primary) hypertension: Secondary | ICD-10-CM

## 2022-05-09 DIAGNOSIS — E785 Hyperlipidemia, unspecified: Secondary | ICD-10-CM

## 2022-05-09 DIAGNOSIS — R7303 Prediabetes: Secondary | ICD-10-CM

## 2022-05-09 DIAGNOSIS — J4520 Mild intermittent asthma, uncomplicated: Secondary | ICD-10-CM

## 2022-05-09 LAB — COMPREHENSIVE METABOLIC PANEL
ALT: 19 U/L (ref 0–53)
AST: 18 U/L (ref 0–37)
Albumin: 4.4 g/dL (ref 3.5–5.2)
Alkaline Phosphatase: 63 U/L (ref 39–117)
BUN: 15 mg/dL (ref 6–23)
CO2: 30 mEq/L (ref 19–32)
Calcium: 9.1 mg/dL (ref 8.4–10.5)
Chloride: 105 mEq/L (ref 96–112)
Creatinine, Ser: 1.07 mg/dL (ref 0.40–1.50)
GFR: 74.11 mL/min (ref >60.00)
Glucose, Bld: 101 mg/dL — ABNORMAL HIGH (ref 70–99)
Potassium: 4.4 mEq/L (ref 3.5–5.1)
Sodium: 140 mEq/L (ref 135–145)
Total Bilirubin: 0.9 mg/dL (ref 0.2–1.2)
Total Protein: 6.6 g/dL (ref 6.0–8.3)

## 2022-05-09 LAB — HEMOGLOBIN A1C: Hgb A1c MFr Bld: 5.7 % (ref 4.6–6.5)

## 2022-05-09 LAB — CBC WITH DIFFERENTIAL/PLATELET
Basophils Absolute: 0 10*3/uL (ref 0.0–0.1)
Basophils Relative: 0.9 % (ref 0.0–3.0)
Eosinophils Absolute: 0.1 10*3/uL (ref 0.0–0.7)
Eosinophils Relative: 3.3 % (ref 0.0–5.0)
HCT: 41.2 % (ref 39.0–52.0)
Hemoglobin: 13.8 g/dL (ref 13.0–17.0)
Lymphocytes Relative: 32.2 % (ref 12.0–46.0)
Lymphs Abs: 1.3 10*3/uL (ref 0.7–4.0)
MCHC: 33.4 g/dL (ref 30.0–36.0)
MCV: 91.1 fl (ref 78.0–100.0)
Monocytes Absolute: 0.3 10*3/uL (ref 0.1–1.0)
Monocytes Relative: 6.9 % (ref 3.0–12.0)
Neutro Abs: 2.4 10*3/uL (ref 1.4–7.7)
Neutrophils Relative %: 56.7 % (ref 43.0–77.0)
Platelets: 201 10*3/uL (ref 150.0–400.0)
RBC: 4.52 Mil/uL (ref 4.22–5.81)
RDW: 13.4 % (ref 11.5–15.5)
WBC: 4.2 10*3/uL (ref 4.0–10.5)

## 2022-05-09 LAB — LIPID PANEL
Cholesterol: 165 mg/dL (ref 0–200)
HDL: 52.3 mg/dL (ref >39.00)
LDL Cholesterol: 102 mg/dL — ABNORMAL HIGH (ref 0–99)
NonHDL: 112.7
Total CHOL/HDL Ratio: 3
Triglycerides: 52 mg/dL (ref 0.0–149.0)
VLDL: 10.4 mg/dL (ref 0.0–40.0)

## 2022-05-09 MED ORDER — SHINGRIX 50 MCG/0.5ML IM SUSR: 0.5000 mL | Freq: Once | INTRAMUSCULAR | 1 refills | Status: AC

## 2022-05-09 MED ORDER — AZELASTINE-FLUTICASONE 137-50 MCG/ACT NA SUSP: 1.0000 | Freq: Two times a day (BID) | NASAL | 11 refills | Status: DC | PRN

## 2022-05-09 MED ORDER — ALBUTEROL SULFATE HFA 108 (90 BASE) MCG/ACT IN AERS: 1.0000 | INHALATION_SPRAY | Freq: Four times a day (QID) | RESPIRATORY_TRACT | 11 refills | Status: DC | PRN

## 2022-05-09 NOTE — Telephone Encounter (Signed)
Lvm for pt to return call in regards to lab results.  Per Dr.Tracy: Liver kidneys normal  Cholesterol improved great work goal LDL <100  Blood cts normal  A1c=prediabetes  Rec healthy diet and exercise

## 2022-05-09 NOTE — Patient Instructions (Addendum)
Colonoscopy due 08/17/22 call to schedule  MD Physician   Primary Contact Information  Phone Fax E-mail Address  [phone] Not available [place] [address]     Specialties     Gastroenterology

## 2022-05-09 NOTE — Progress Notes (Signed)
Chief Complaint  Patient presents with   Follow-up    Chronic care management f/u   F/u  1. Htn controlled on losartan hctz 50-12.5 mg qd  2. H/o PSA PSA undetectable 10/18/21 will need to repeat yearly     Review of Systems  Constitutional:  Negative for weight loss.  HENT:  Negative for hearing loss.   Eyes:  Negative for blurred vision.  Respiratory:  Negative for shortness of breath.   Cardiovascular:  Negative for chest pain.  Gastrointestinal:  Negative for abdominal pain and blood in stool.  Genitourinary:  Negative for dysuria.  Musculoskeletal:  Negative for back pain, falls and joint pain.  Skin:  Negative for rash.  Neurological:  Negative for headaches.  Psychiatric/Behavioral:  Negative for depression.    Past Medical History:  Diagnosis Date   Asthma    dust   Cancer ([place])    prostate; early 58s    Chronic pain    f/u pain clinic in [place]; arms, neck, back-pain clinic in [place]   COVID-19    02/17/20 hosp 5/3-02/27/20 covid pneumonia, 12/31/21   Glaucoma    HLD (hyperlipidemia)    Hypertension    Spinal cord injury, C5-C7 ([place])    C6.C7 in 2008 injury at work he was paralyzed from waist down but walking again;workers comp   Past Surgical History:  Procedure Laterality Date   COLONOSCOPY WITH PROPOFOL N/A 08/18/2019   Procedure: COLONOSCOPY WITH PROPOFOL;  Surgeon: Lin Landsman, MD;  Location: [facility] ENDOSCOPY;  Service: Gastroenterology;  Laterality: N/A;   LUMBAR LAMINECTOMY     PROSTATECTOMY     had radiation no injections h/o prostate pump    Family History  Problem Relation Age of Onset   Heart failure Mother    Cancer Father        prostate   Dementia Father    Cancer Brother        prostate   Prostate cancer Brother    Pancreatic cancer Paternal Grandmother    Prostate cancer Other    Prostate cancer Brother        prostate   Dementia Sister    Social History   Socioeconomic History   Marital status: Married    Spouse name: Not on file    Number of children: Not on file   Years of education: Not on file   Highest education level: Not on file  Occupational History   Not on file  Tobacco Use   Smoking status: Former   Smokeless tobacco: Never  Substance and Sexual Activity   Alcohol use: Yes    Comment: Occasionally    Drug use: Not on file   Sexual activity: Not on file  Other Topics Concern   Not on file  Social History Narrative   Former smoker quit 36    Occasional etoh    Married with 2 sons and 1 daughter    From [place]   Retired    Social Determinants of Radio broadcast assistant Strain: [place]  (09/23/2021)   Overall Financial Resource Strain (CARDIA)    Difficulty of Paying Living Expenses: Not hard at all  Food Insecurity: No [place] (09/23/2021)   Hunger Vital Sign    Worried About Running Out of Food in the Last Year: Never true    [place] in the Last Year: Never true  Transportation Needs: No Transportation Needs (09/23/2021)   PRAPARE - Transportation    Lack of  Transportation (Medical): No    Lack of Transportation (Non-Medical): No  Physical Activity: Insufficiently Active (09/23/2021)   Exercise Vital Sign    Days of Exercise per Week: 4 days    Minutes of Exercise per Session: 20 min  Stress: No Stress Concern Present (09/23/2021)   [place]    Feeling of Stress : Not at all  Social Connections: Unknown (09/23/2021)   Social Connection and Isolation Panel [NHANES]    Frequency of Communication with Friends and Family: Not on file    Frequency of Social Gatherings with Friends and Family: Not on file    Attends Religious Services: Not on file    Active Member of Clubs or Organizations: Not on file    Attends Archivist Meetings: Not on file    Marital Status: Married  Intimate Partner Violence: Not At Risk (09/23/2021)   Humiliation, Afraid, Rape, and Kick questionnaire    Fear of Current or  Ex-Partner: No    Emotionally Abused: No    Physically Abused: No    Sexually Abused: No   Current Meds  Medication Sig   Ascorbic Acid (VITAMIN C) 1000 MG tablet Take 1,000 mg by mouth daily.   atorvastatin (LIPITOR) 20 MG tablet TAKE 1 TABLET BY MOUTH EVERY DAY   B Complex Vitamins (VITAMIN B-COMPLEX) TABS Take 1 tablet by mouth daily in the afternoon.    clotrimazole-betamethasone (LOTRISONE) cream Apply 1 Application topically daily.   Cyanocobalamin (B-12) 500 MCG TABS Take 1,000 mcg by mouth daily.   gabapentin (NEURONTIN) 800 MG tablet Take 1 tablet (800 mg total) by mouth in the morning, at noon, in the evening, and at bedtime. Per pain clinic (Patient taking differently: Take 400 mg by mouth. Per pain clinic)   hydrocortisone 2.5 % lotion APPLY TO NECK TWICE A DAY AS DIRECTED   losartan-hydrochlorothiazide (HYZAAR) 50-12.5 MG tablet TAKE 1 TABLET BY MOUTH DAILY IN THE MORNING. STOP PLAIN LOSARTAN 50   LUMIGAN 0.01 % SOLN INSTILL 1 DROP INTO BOTH EYES EVERY NIGHT AT BEDTIME   MOVANTIK 25 MG TABS tablet Take 25 mg by mouth daily.   Polyethylene Glycol 400 (BLINK TEARS OP) Apply to eye.   Quercetin 250 MG TABS Take 1 tablet (250 mg total) by mouth in the morning and at bedtime.   Vitamin D, Ergocalciferol, 50 MCG (2000 UT) CAPS Take 4,000 Units by mouth daily.   XTAMPZA ER 9 MG C12A TAKE ONE CAPSULE BY MOUTH EVERY 12 HOURS   Zinc 100 MG TABS Take 1 tablet (100 mg total) by mouth daily.   Zoster Vaccine Adjuvanted [facility]) injection Inject 0.5 mLs into the muscle once for 1 dose.   [DISCONTINUED] albuterol (VENTOLIN HFA) 108 (90 Base) MCG/ACT inhaler Inhale 1-2 puffs into the lungs every 6 (six) hours as needed for wheezing or shortness of breath.   [DISCONTINUED] Azelastine-Fluticasone (DYMISTA) 137-50 MCG/ACT SUSP Place 1 spray into both nostrils 2 (two) times daily as needed.   No Active Allergies No results found for this or any previous visit (from the past 2160  hour(s)). Objective  Body mass index is 33 kg/m. Wt Readings from Last 3 Encounters:  05/09/22 230 lb (104.3 kg)  04/07/22 230 lb 9.6 oz (104.6 kg)  01/05/22 222 lb (100.7 kg)   Temp Readings from Last 3 Encounters:  05/09/22 98.1 F (36.7 C) (Oral)  04/07/22 98.2 F (36.8 C) (Oral)  10/18/21 97.7 F (36.5 C) (Temporal)  BP Readings from Last 3 Encounters:  05/09/22 124/76  04/07/22 130/84  10/27/21 113/78   Pulse Readings from Last 3 Encounters:  05/09/22 71  04/07/22 96  10/27/21 76    Physical Exam Vitals and nursing note reviewed.  Constitutional:      Appearance: Normal appearance. He is well-developed and well-groomed.  HENT:     Head: Normocephalic and atraumatic.  Eyes:     Conjunctiva/sclera: Conjunctivae normal.     Pupils: Pupils are equal, round, and reactive to light.  Cardiovascular:     Rate and Rhythm: Normal rate and regular rhythm.     Heart sounds: Normal heart sounds.  Pulmonary:     Effort: Pulmonary effort is normal. No respiratory distress.     Breath sounds: Normal breath sounds.  Abdominal:     Tenderness: There is no abdominal tenderness.  Skin:    General: Skin is warm and moist.  Neurological:     General: No focal deficit present.     Mental Status: He is alert and oriented to person, place, and time. Mental status is at baseline.     Sensory: Sensation is intact.     Motor: Motor function is intact.     Coordination: Coordination is intact.     Gait: Gait is intact. Gait normal.  Psychiatric:        Attention and Perception: Attention and perception normal.        Mood and Affect: Mood and affect normal.        Speech: Speech normal.        Behavior: Behavior normal. Behavior is cooperative.        Thought Content: Thought content normal.        Cognition and Memory: Cognition and memory normal.        Judgment: Judgment normal.     Assessment  Plan  Hypertension, unspecified type - Plan: Comprehensive metabolic panel,  Lipid panel, CBC with Differential/Platelet On losartan hctz 50-12.5 mg qd  Hyperlipidemia, unspecified hyperlipidemia type - Plan: Lipid panel  Prediabetes - Plan: Hemoglobin A1c  Intermittent asthma without complication, unspecified asthma severity - Plan: albuterol (VENTOLIN HFA) 108 (90 Base) MCG/ACT inhaler   HM Fasting labs today  Flu shot utd  prevnar utd  Pfizer 5/5 Tdap 06/02/21  Prevnar 20 prevnar 13  1/2 shingrix per pt had 2/2 Rx shingrix again to restart    Colonoscopy 08/18/19 tubular adenoma f/u in 3 years  Dr. Marius Ditch Call to schedule  Undetectable PSA 10/18/21 h/o prostate cancer with h/o removal and radiation no injections Former smoker quit in 1s Occasional etoh   Given names of eye MD and dentists in the area Former PCP Dr. Genia Plants [place] in [place] in [place] received    EGD 09/25/10 esophagitis duodenitis mildly enlarged major papilla path neg h pylori chronic peptic duodentitis c/w adenomatous change; GERD   EGD path 06/05/12 gastric antrum bx negative duodenum negative no H pylori    EGD 01/23/15 mild erythema in stomach   EGD 01/24/17 -see report scanned in  path 01/24/17 gastric antrum bx and duodenum bx negative no H pylori    Upper EUS 03/08/11 see report scanned in chart  Upper EUS 01/23/15 see report scanned in chart  Upper EUS 01/24/17 GB sludge, no gallstones, bile duct normal, hyperechoic foci and strands in pancreas and lobularity but no parenchymal calcifications/pancreatic ductal dilatation noted, 2 small cysts in pancreas body 4-5 mm liver with 6 mm  cyst no lymphadenopathy    Colonoscopy 08/02/13 hyperplastic polyp sigmoid colon and mild hyperplastic changes rectum   Colonoscopy 07/27/10 semi pedunculated polyp splenic flexure IH/EH   Colonoscopy 07/31/08 pedunculated polyp transverse colon few diminutive sessile polyps rectum diverticulosis IH/EH    rec health diet and exercise   Has seen [facility]  PT    Provider: Dr. Olivia Mackie  [facility]

## 2022-05-18 ENCOUNTER — Other Ambulatory Visit: Payer: Self-pay | Admitting: Internal Medicine

## 2022-05-18 DIAGNOSIS — L309 Dermatitis, unspecified: Secondary | ICD-10-CM

## 2022-07-18 ENCOUNTER — Encounter: Payer: Self-pay | Admitting: Internal Medicine

## 2022-07-18 ENCOUNTER — Telehealth (INDEPENDENT_AMBULATORY_CARE_PROVIDER_SITE_OTHER): Payer: Medicare HMO | Admitting: Internal Medicine

## 2022-07-18 ENCOUNTER — Telehealth: Payer: Self-pay

## 2022-07-18 ENCOUNTER — Other Ambulatory Visit: Payer: Medicare HMO

## 2022-07-18 VITALS — BP 117/79 | Ht 70.0 in | Wt 230.0 lb

## 2022-07-18 DIAGNOSIS — J309 Allergic rhinitis, unspecified: Secondary | ICD-10-CM | POA: Diagnosis not present

## 2022-07-18 DIAGNOSIS — J029 Acute pharyngitis, unspecified: Secondary | ICD-10-CM

## 2022-07-18 DIAGNOSIS — J0110 Acute frontal sinusitis, unspecified: Secondary | ICD-10-CM | POA: Diagnosis not present

## 2022-07-18 LAB — POCT INFLUENZA A/B
Influenza A, POC: NEGATIVE
Influenza B, POC: NEGATIVE

## 2022-07-18 LAB — POCT RAPID STREP A (OFFICE): Rapid Strep A Screen: NEGATIVE

## 2022-07-18 MED ORDER — CETIRIZINE HCL 10 MG PO TABS: 10.0000 mg | ORAL_TABLET | Freq: Every evening | ORAL | 3 refills | Status: DC | PRN

## 2022-07-18 MED ORDER — AMOXICILLIN-POT CLAVULANATE 875-125 MG PO TABS: 1.0000 | ORAL_TABLET | Freq: Two times a day (BID) | ORAL | 0 refills | Status: DC

## 2022-07-18 MED ORDER — SALINE SPRAY 0.65 % NA SOLN: 2.0000 | Freq: Every day | NASAL | 0 refills | Status: AC | PRN

## 2022-07-18 NOTE — Telephone Encounter (Signed)
  LMOM for pt to CB in regards to labs:    McLean-Scocuzza, Nino Glow, MD  Gracy Racer, CMA Flu and strep negative

## 2022-07-18 NOTE — Progress Notes (Signed)
Virtual Visit via Video Note  I connected with Matthew Turner  on 07/18/22 at  1:00 PM EDT by a video enabled telemedicine application and verified that I am speaking with the correct person using two identifiers.  Location patient: [place] Location provider:work or home office Persons participating in the virtual visit: patient, provider  I discussed the limitations and requested verbal permission for telemedicine visit. The patient expressed understanding and agreed to proceed.   HPI:  Acute telemedicine visit for : +sore throat/scratchy x 3 weeks started out as sinus pressure and pain and salty taste in mouth and facial swelling, he has been coughing sneezing, runny nose sister and grandkids in [place] sick when he saw them 3 weeks ago but no one in [place] sick  Tried nyquil and helped some   -Pertinent past medical history: see below -Pertinent medication allergies:No Active Allergies -COVID-19 vaccine status:  Immunization History  Administered Date(s) Administered   Influenza Inj Mdck Quad Pf 06/26/2019   Influenza,inj,Quad PF,6+ Mos 10/07/2020, 10/07/2021   PFIZER Comirnaty(Gray Top)Covid-19 Tri-Sucrose Vaccine 06/21/2020   PFIZER(Purple Top)SARS-COV-2 Vaccination 01/31/2020, 05/29/2020, 06/21/2020   PNEUMOCOCCAL CONJUGATE-20 10/07/2021   Pfizer Covid-19 Vaccine Bivalent Booster 69yr & up 08/12/2021   Pneumococcal Conjugate-13 11/11/2019   Tdap 06/02/2021   Zoster Recombinat (Shingrix) 08/12/2021     ROS: See pertinent positives and negatives per HPI.  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;workers comp    Past  Surgical History:  Procedure Laterality Date   COLONOSCOPY WITH PROPOFOL N/A 08/18/2019   Procedure: COLONOSCOPY WITH PROPOFOL;  Surgeon: VLin Landsman MD;  [facility]  Service: Gastroenterology;  Laterality: N/A;   LUMBAR LAMINECTOMY     PROSTATECTOMY     had radiation no injections h/o prostate pump      Current Outpatient Medications:    albuterol (VENTOLIN HFA) 108 (90 Base) MCG/ACT inhaler, Inhale 1-2 puffs into the lungs every 6 (six) hours as needed for wheezing or shortness of breath., Disp: 18 g, Rfl: 11   amoxicillin-clavulanate (AUGMENTIN) 875-125 MG tablet, Take 1 tablet by mouth 2 (two) times daily. With food, Disp: 14 tablet, Rfl: 0   Ascorbic Acid (VITAMIN C) 1000 MG tablet, Take 1,000 mg by mouth daily., Disp: , Rfl:    atorvastatin (LIPITOR) 20 MG tablet, TAKE 1 TABLET BY MOUTH EVERY DAY, Disp: 90 tablet, Rfl: 3   Azelastine-Fluticasone (DYMISTA) 137-50 MCG/ACT SUSP, Place 1 spray into both nostrils 2 (two) times daily as needed., Disp: 23 g, Rfl: 11   B Complex Vitamins (VITAMIN B-COMPLEX) TABS, Take 1 tablet by mouth daily in the afternoon. , Disp: , Rfl:    cetirizine (ZYRTEC) 10 MG tablet, Take 1 tablet (10 mg total) by mouth at bedtime as needed for allergies., Disp: 90 tablet, Rfl: 3   Cyanocobalamin (B-12) 500 MCG TABS, Take 1,000 mcg by mouth daily., Disp: 90 tablet, Rfl: 3   hydrocortisone 2.5 % lotion, APPLY TO NECK TWICE A DAY AS DIRECTED, Disp: 118 mL, Rfl: 0  losartan-hydrochlorothiazide (HYZAAR) 50-12.5 MG tablet, TAKE 1 TABLET BY MOUTH DAILY IN THE MORNING. STOP PLAIN LOSARTAN 50, Disp: 90 tablet, Rfl: 3   LUMIGAN 0.01 % SOLN, INSTILL 1 DROP INTO BOTH EYES EVERY NIGHT AT BEDTIME, Disp: 7.5 mL, Rfl: 1   MOVANTIK 25 MG TABS tablet, Take 25 mg by mouth daily., Disp: , Rfl:    Polyethylene Glycol 400 (BLINK TEARS OP), Apply to eye., Disp: , Rfl:    Quercetin 250 MG TABS, Take 1 tablet (250 mg total) by mouth in the morning and at bedtime.,  Disp: 180 tablet, Rfl: 3   sodium chloride (OCEAN) 0.65 % SOLN nasal spray, Place 2 sprays into both nostrils daily as needed for congestion., Disp: 30 mL, Rfl: 0   Vitamin D, Ergocalciferol, 50 MCG (2000 UT) CAPS, Take 4,000 Units by mouth daily., Disp: 180 capsule, Rfl: 3   XTAMPZA ER 9 MG C12A, TAKE ONE CAPSULE BY MOUTH EVERY 12 HOURS, Disp: , Rfl:    Zinc 100 MG TABS, Take 1 tablet (100 mg total) by mouth daily., Disp: 90 tablet, Rfl: 3   clotrimazole-betamethasone (LOTRISONE) cream, Apply 1 Application topically daily. (Patient not taking: Reported on 07/18/2022), Disp: 30 g, Rfl: 0   gabapentin (NEURONTIN) 800 MG tablet, Take 1 tablet (800 mg total) by mouth in the morning, at noon, in the evening, and at bedtime. Per pain clinic (Patient taking differently: Take 400 mg by mouth. Per pain clinic), Disp: , Rfl:   EXAM:  VITALS per patient if applicable:  GENERAL: alert, oriented, appears well and in no acute distress  HEENT: atraumatic, conjunttiva clear, no obvious abnormalities on inspection of external nose and ears  NECK: normal movements of the head and neck  LUNGS: on inspection no signs of respiratory distress, breathing rate appears normal, no obvious gross SOB, gasping or wheezing  CV: no obvious cyanosis  MS: moves all visible extremities without noticeable abnormality  PSYCH/NEURO: pleasant and cooperative, no obvious depression or anxiety, speech and thought processing grossly intact  ASSESSMENT AND PLAN:  Discussed the following assessment and plan:  Sore throat - Plan: POCT Influenza A/B, POCT rapid strep A, Novel Coronavirus, NAA ([facility]), amoxicillin-clavulanate (AUGMENTIN) 875-125 MG tablet  Allergic rhinitis, unspecified seasonality, unspecified trigger - Plan: sodium chloride (OCEAN) 0.65 % SOLN nasal spray, cetirizine (ZYRTEC) 10 MG tablet  Acute non-recurrent frontal sinusitis - Plan: amoxicillin-clavulanate (AUGMENTIN) 875-125 MG tablet  -we discussed  possible serious and likely etiologies, options for evaluation and workup, limitations of telemedicine visit vs in person visit, treatment, treatment risks and precautions. Pt is agreeable to treatment via telemedicine at this moment.   I discussed the assessment and treatment plan with the patient. The patient was provided an opportunity to ask questions and all were answered. The patient agreed with the plan and demonstrated an understanding of the instructions.    Time spent 20 min Delorise Axe, MD

## 2022-07-19 LAB — NOVEL CORONAVIRUS, NAA: SARS-CoV-2, NAA: NOT DETECTED

## 2022-07-25 ENCOUNTER — Telehealth: Payer: Self-pay

## 2022-07-25 ENCOUNTER — Other Ambulatory Visit: Payer: Self-pay

## 2022-07-25 DIAGNOSIS — K219 Gastro-esophageal reflux disease without esophagitis: Secondary | ICD-10-CM

## 2022-07-25 DIAGNOSIS — Z86010 Personal history of colonic polyps: Secondary | ICD-10-CM

## 2022-07-25 NOTE — Telephone Encounter (Signed)
Gastroenterology Pre-Procedure Review  Request Date: 08/23/22 Requesting Physician: Dr. Marius Ditch  PATIENT REVIEW QUESTIONS: The patient responded to the following health history questions as indicated:    1. Are you having any GI issues? yes (throat irritation, acid reflux patient requesting EGD last EGD was performed 01/24/2017 for GERD by Dr. Gara Kroner) 2. Do you have a personal history of Polyps? yes (last colonoscopy performed by Dr. Marius Ditch 08/18/19 polyps were noted) 3. Do you have a family history of Colon Cancer or Polyps? no 4. Diabetes Mellitus? no 5. Joint replacements in the past 12 months?no 6. Major health problems in the past 3 months?no 7. Any artificial heart valves, MVP, or defibrillator?no    MEDICATIONS & ALLERGIES:    Patient reports the following regarding taking any anticoagulation/antiplatelet therapy:   Plavix, Coumadin, Eliquis, Xarelto, Lovenox, Pradaxa, Brilinta, or Effient? no Aspirin? no  Patient confirms/reports the following medications:  Current Outpatient Medications  Medication Sig Dispense Refill   albuterol (VENTOLIN HFA) 108 (90 Base) MCG/ACT inhaler Inhale 1-2 puffs into the lungs every 6 (six) hours as needed for wheezing or shortness of breath. 18 g 11   amoxicillin-clavulanate (AUGMENTIN) 875-125 MG tablet Take 1 tablet by mouth 2 (two) times daily. With food 14 tablet 0   Ascorbic Acid (VITAMIN C) 1000 MG tablet Take 1,000 mg by mouth daily.     atorvastatin (LIPITOR) 20 MG tablet TAKE 1 TABLET BY MOUTH EVERY DAY 90 tablet 3   Azelastine-Fluticasone (DYMISTA) 137-50 MCG/ACT SUSP Place 1 spray into both nostrils 2 (two) times daily as needed. 23 g 11   B Complex Vitamins (VITAMIN B-COMPLEX) TABS Take 1 tablet by mouth daily in the afternoon.      cetirizine (ZYRTEC) 10 MG tablet Take 1 tablet (10 mg total) by mouth at bedtime as needed for allergies. 90 tablet 3   clotrimazole-betamethasone (LOTRISONE) cream Apply 1 Application topically daily.  (Patient not taking: Reported on 07/18/2022) 30 g 0   Cyanocobalamin (B-12) 500 MCG TABS Take 1,000 mcg by mouth daily. 90 tablet 3   gabapentin (NEURONTIN) 800 MG tablet Take 1 tablet (800 mg total) by mouth in the morning, at noon, in the evening, and at bedtime. Per pain clinic (Patient taking differently: Take 400 mg by mouth. Per pain clinic)     hydrocortisone 2.5 % lotion APPLY TO NECK TWICE A DAY AS DIRECTED 118 mL 0   losartan-hydrochlorothiazide (HYZAAR) 50-12.5 MG tablet TAKE 1 TABLET BY MOUTH DAILY IN THE MORNING. STOP PLAIN LOSARTAN 50 90 tablet 3   LUMIGAN 0.01 % SOLN INSTILL 1 DROP INTO BOTH EYES EVERY NIGHT AT BEDTIME 7.5 mL 1   MOVANTIK 25 MG TABS tablet Take 25 mg by mouth daily.     Polyethylene Glycol 400 (BLINK TEARS OP) Apply to eye.     Quercetin 250 MG TABS Take 1 tablet (250 mg total) by mouth in the morning and at bedtime. 180 tablet 3   sodium chloride (OCEAN) 0.65 % SOLN nasal spray Place 2 sprays into both nostrils daily as needed for congestion. 30 mL 0   Vitamin D, Ergocalciferol, 50 MCG (2000 UT) CAPS Take 4,000 Units by mouth daily. 180 capsule 3   XTAMPZA ER 9 MG C12A TAKE ONE CAPSULE BY MOUTH EVERY 12 HOURS     Zinc 100 MG TABS Take 1 tablet (100 mg total) by mouth daily. 90 tablet 3   No current facility-administered medications for this visit.    Patient confirms/reports the following allergies:  No  Active Allergies  No orders of the defined types were placed in this encounter.   AUTHORIZATION INFORMATION Primary Insurance: 1D#: Group #:  Secondary Insurance: 1D#: Group #:  SCHEDULE INFORMATION: Date: 08/23/22 Time: Location: [facility]

## 2022-07-25 NOTE — Telephone Encounter (Signed)
[name]  I reviewed his previous upper endoscopy note from 2018.  He did not have Barrett's esophagus at that time.  Therefore, I do not recommend upper endoscopy at this time.  Also, if he is not having any long-term acid reflux symptoms and not taking any acid reflux medication, there is no indication for repeat upper endoscopy  Matthew Turner

## 2022-07-28 ENCOUNTER — Other Ambulatory Visit: Payer: Self-pay | Admitting: Internal Medicine

## 2022-07-28 DIAGNOSIS — J4520 Mild intermittent asthma, uncomplicated: Secondary | ICD-10-CM

## 2022-08-18 ENCOUNTER — Telehealth: Payer: Self-pay

## 2022-08-18 DIAGNOSIS — Z86010 Personal history of colonic polyps: Secondary | ICD-10-CM

## 2022-08-18 MED ORDER — NA SULFATE-K SULFATE-MG SULF 17.5-3.13-1.6 GM/177ML PO SOLN: 1.0000 | Freq: Once | ORAL | 0 refills | Status: AC

## 2022-08-18 NOTE — Telephone Encounter (Signed)
Patients call returned.  Patient has been informed his rx for Suprep has been sent to [facility] in [place] and it will be $10.  Thanks,  [name], [place]

## 2022-08-18 NOTE — Telephone Encounter (Signed)
Patient left a voicemail on my phone stating that he has not receive his prep for his colonoscopy that is schedule for 08/23/2022

## 2022-08-21 ENCOUNTER — Telehealth: Payer: Self-pay

## 2022-08-21 ENCOUNTER — Encounter (INDEPENDENT_AMBULATORY_CARE_PROVIDER_SITE_OTHER): Payer: Self-pay

## 2022-08-21 NOTE — Telephone Encounter (Signed)
Patient left a voicemail is and has some question about her colonoscopy

## 2022-08-21 NOTE — Telephone Encounter (Signed)
Phone call returned to patient.  Date clarified also.  Patient confirmed 08/23/22.  He inquired about the time of his procedure.  Informed him that he will receive a call from the endoscopy dept tomorrow between 1-3 with arrival time or he can call between 1-3 tomorrow.  Thanks,  [place], [place]

## 2022-08-23 ENCOUNTER — Telehealth: Payer: Self-pay

## 2022-08-23 DIAGNOSIS — Z86010 Personal history of colonic polyps: Secondary | ICD-10-CM

## 2022-08-23 NOTE — Telephone Encounter (Signed)
Patient contacted office to inquire about his Clear Liquid Diet.  I asked if he received his instructions and he stated that he had not looked at them because he could not find them in his mychart.  He had already eaten a waffle earlier.  Informed him that we will need to reschedule his procedure.  Colonoscopy has been rescheduled to 09/12/22 with Dr. Marius Ditch.  Endoscopy Dept made aware of date change.  Instructions will be mailed and sent to patient via mychart.  Thanks,  [place], [place]

## 2022-09-11 ENCOUNTER — Encounter: Payer: Self-pay | Admitting: Gastroenterology

## 2022-09-12 ENCOUNTER — Ambulatory Visit: Payer: Medicare HMO | Admitting: Certified Registered"

## 2022-09-12 ENCOUNTER — Encounter: Payer: Self-pay | Admitting: Gastroenterology

## 2022-09-12 ENCOUNTER — Ambulatory Visit
Admission: RE | Admit: 2022-09-12 | Discharge: 2022-09-12 | Disposition: A | Discharge status: Home / Self Care | Payer: Medicare HMO | Source: Ambulatory Visit | Attending: Gastroenterology | Admitting: Gastroenterology

## 2022-09-12 ENCOUNTER — Encounter
Admission: RE | Disposition: A | Discharge status: Home / Self Care | Payer: Self-pay | Source: Ambulatory Visit | Attending: Gastroenterology

## 2022-09-12 DIAGNOSIS — K644 Residual hemorrhoidal skin tags: Secondary | ICD-10-CM | POA: Insufficient documentation

## 2022-09-12 DIAGNOSIS — K219 Gastro-esophageal reflux disease without esophagitis: Secondary | ICD-10-CM

## 2022-09-12 DIAGNOSIS — Z87891 Personal history of nicotine dependence: Secondary | ICD-10-CM | POA: Insufficient documentation

## 2022-09-12 DIAGNOSIS — Z8546 Personal history of malignant neoplasm of prostate: Secondary | ICD-10-CM | POA: Diagnosis not present

## 2022-09-12 DIAGNOSIS — K5730 Diverticulosis of large intestine without perforation or abscess without bleeding: Secondary | ICD-10-CM | POA: Diagnosis not present

## 2022-09-12 DIAGNOSIS — J45909 Unspecified asthma, uncomplicated: Secondary | ICD-10-CM | POA: Insufficient documentation

## 2022-09-12 DIAGNOSIS — I739 Peripheral vascular disease, unspecified: Secondary | ICD-10-CM | POA: Diagnosis not present

## 2022-09-12 DIAGNOSIS — G8929 Other chronic pain: Secondary | ICD-10-CM | POA: Insufficient documentation

## 2022-09-12 DIAGNOSIS — Z8616 Personal history of COVID-19: Secondary | ICD-10-CM | POA: Insufficient documentation

## 2022-09-12 DIAGNOSIS — Z860100 Personal history of colon polyps, unspecified: Secondary | ICD-10-CM

## 2022-09-12 DIAGNOSIS — E785 Hyperlipidemia, unspecified: Secondary | ICD-10-CM | POA: Diagnosis not present

## 2022-09-12 DIAGNOSIS — H409 Unspecified glaucoma: Secondary | ICD-10-CM | POA: Diagnosis not present

## 2022-09-12 DIAGNOSIS — D124 Benign neoplasm of descending colon: Secondary | ICD-10-CM | POA: Insufficient documentation

## 2022-09-12 DIAGNOSIS — Z79899 Other long term (current) drug therapy: Secondary | ICD-10-CM | POA: Insufficient documentation

## 2022-09-12 DIAGNOSIS — K635 Polyp of colon: Secondary | ICD-10-CM | POA: Diagnosis not present

## 2022-09-12 DIAGNOSIS — I10 Essential (primary) hypertension: Secondary | ICD-10-CM | POA: Insufficient documentation

## 2022-09-12 DIAGNOSIS — K621 Rectal polyp: Secondary | ICD-10-CM | POA: Diagnosis not present

## 2022-09-12 DIAGNOSIS — Z09 Encounter for follow-up examination after completed treatment for conditions other than malignant neoplasm: Secondary | ICD-10-CM | POA: Diagnosis present

## 2022-09-12 HISTORY — PX: COLONOSCOPY WITH PROPOFOL: SHX5780

## 2022-09-12 SURGERY — COLONOSCOPY WITH PROPOFOL
Anesthesia: General

## 2022-09-12 MED ORDER — SIMETHICONE 40 MG/0.6ML PO SUSP
ORAL | Status: DC | PRN
  Administered 2022-09-12: 180 mL

## 2022-09-12 MED ORDER — PROPOFOL 500 MG/50ML IV EMUL
INTRAVENOUS | Status: DC | PRN
  Administered 2022-09-12: 130 ug/kg/min via INTRAVENOUS

## 2022-09-12 MED ORDER — LIDOCAINE HCL (CARDIAC) PF 100 MG/5ML IV SOSY
PREFILLED_SYRINGE | INTRAVENOUS | Status: DC | PRN
  Administered 2022-09-12: 100 mg via INTRAVENOUS

## 2022-09-12 MED ORDER — PROPOFOL 10 MG/ML IV BOLUS
INTRAVENOUS | Status: DC | PRN
  Administered 2022-09-12 (×2): 100 mg via INTRAVENOUS

## 2022-09-12 MED ORDER — DEXMEDETOMIDINE HCL IN NACL 80 MCG/20ML IV SOLN
INTRAVENOUS | Status: DC | PRN
  Administered 2022-09-12: 12 ug via BUCCAL

## 2022-09-12 MED ORDER — SODIUM CHLORIDE 0.9 % IV SOLN: INTRAVENOUS | Status: DC

## 2022-09-12 NOTE — Transfer of Care (Signed)
Immediate Anesthesia Transfer of Care Note  Patient: Matthew Turner  Procedure(s) Performed: COLONOSCOPY WITH PROPOFOL  Patient Location: Endoscopy Unit  Anesthesia Type:General  Level of Consciousness: drowsy  Airway & Oxygen Therapy: Patient Spontanous Breathing  Post-op Assessment: Report given to RN  Post vital signs: stable  Last Vitals:  Vitals Value Taken Time  BP    Temp    Pulse    Resp    SpO2      Last Pain:  Vitals:   09/12/22 1043  TempSrc: Temporal  PainSc: 0-No pain         Complications: No notable events documented.

## 2022-09-12 NOTE — Anesthesia Preprocedure Evaluation (Signed)
Anesthesia Evaluation  Patient identified by MRN, date of birth, ID band Patient awake    Reviewed: Allergy & Precautions, NPO status , Patient's Chart, lab work & pertinent test results  Airway Mallampati: II  TM Distance: >3 FB Neck ROM: Full    Dental  (+) Missing,    Pulmonary neg pulmonary ROS, asthma , former smoker   Pulmonary exam normal breath sounds clear to auscultation       Cardiovascular Pt. on medications + Peripheral Vascular Disease  negative cardio ROS Normal cardiovascular exam Rhythm:Regular     Neuro/Psych negative neurological ROS  negative psych ROS   GI/Hepatic negative GI ROS, Neg liver ROS,,,  Endo/Other  negative endocrine ROS    Renal/GU negative Renal ROS  negative genitourinary   Musculoskeletal   Abdominal Normal abdominal exam  (+)   Peds negative pediatric ROS (+)  Hematology negative hematology ROS (+)   Anesthesia Other Findings Past Medical History: No date: Asthma     Comment:  dust No date: Cancer ([place])     Comment:  prostate; early 63s  No date: Chronic pain     Comment:  f/u pain clinic in [place]; arms, neck, back-pain clinic in               [place] No date: COVID-19     Comment:  02/17/20 hosp 5/3-02/27/20 covid pneumonia, 12/31/21 No date: Glaucoma No date: HLD (hyperlipidemia) No date: Hypertension No date: Spinal cord injury, C5-C7 ([place])     Comment:  C6.C7 in 2008 injury at work he was paralyzed from waist              down but walking again;workers comp  Past Surgical History: No date: BACK SURGERY 08/18/2019: COLONOSCOPY WITH PROPOFOL; N/A     Comment:  Procedure: COLONOSCOPY WITH PROPOFOL;  Surgeon: Lin Landsman, MD;  Location: [place];  Service:               Gastroenterology;  Laterality: N/A; No date: LUMBAR LAMINECTOMY No date: PROSTATECTOMY     Comment:  had radiation no injections h/o prostate pump   BMI    Body Mass Index:  31.57 kg/m      Reproductive/Obstetrics negative OB ROS                             Anesthesia Physical Anesthesia Plan  ASA: 3  Anesthesia Plan: General   Post-op Pain Management:    Induction: Intravenous  PONV Risk Score and Plan: Propofol infusion and TIVA  Airway Management Planned: Natural Airway  Additional Equipment:   Intra-op Plan:   Post-operative Plan:   Informed Consent: I have reviewed the patients History and Physical, chart, labs and discussed the procedure including the risks, benefits and alternatives for the proposed anesthesia with the patient or authorized representative who has indicated his/her understanding and acceptance.     Dental Advisory Given  Plan Discussed with: CRNA and Surgeon  Anesthesia Plan Comments:        Anesthesia Quick Evaluation

## 2022-09-12 NOTE — Anesthesia Postprocedure Evaluation (Signed)
Anesthesia Post Note  Patient: Matthew Turner  Procedure(s) Performed: COLONOSCOPY WITH PROPOFOL  Patient location during evaluation: PACU Anesthesia Type: General Level of consciousness: awake Pain management: pain level controlled Vital Signs Assessment: post-procedure vital signs reviewed and stable Respiratory status: spontaneous breathing and nonlabored ventilation Cardiovascular status: stable Anesthetic complications: no  No notable events documented.   Last Vitals:  Vitals:   09/12/22 1200 09/12/22 1205  BP: 106/70 108/78  Pulse:  68  Resp:    Temp:    SpO2:  99%    Last Pain:  Vitals:   09/12/22 1205  TempSrc:   PainSc: 0-No pain                 VAN STAVEREN,Montray Kliebert

## 2022-09-12 NOTE — Addendum Note (Signed)
Addendum  created 09/12/22 1355 by Lerry Liner, CRNA   Intraprocedure Staff edited

## 2022-09-12 NOTE — Op Note (Signed)
[facility] Gastroenterology Patient Name: Matthew Turner Procedure Date: 09/12/2022 10:52 AM MRN: [id number] Account #: [id number] Date of Birth: 26-Dec-1960 Admit Type: Outpatient Age: 61 Room: [facility] ENDO ROOM 3 Gender: Male Note Status: Finalized Instrument Name: Jasper Riling [id number] Procedure:             Colonoscopy Indications:           Surveillance: Personal history of adenomatous polyps                         on last colonoscopy 3 years ago, Last colonoscopy:                         October 2020 Providers:             Lin Landsman MD, MD Referring MD:          Nino Glow Mclean-Scocuzza MD, MD (Referring MD) Medicines:             General Anesthesia Complications:         No immediate complications. Estimated blood loss: None. Procedure:             Pre-Anesthesia Assessment:                        - Prior to the procedure, a History and Physical was                         performed, and patient medications and allergies were                         reviewed. The patient is competent. The risks and                         benefits of the procedure and the sedation options and                         risks were discussed with the patient. All questions                         were answered and informed consent was obtained.                         Patient identification and proposed procedure were                         verified by the physician, the nurse, the                         anesthesiologist, the anesthetist and the technician                         in the pre-procedure area in the procedure room in the                         endoscopy suite. Mental Status Examination: alert and                         oriented. Airway Examination: normal oropharyngeal  airway and neck mobility. Respiratory Examination:                         clear to auscultation. CV Examination: normal.                         Prophylactic  Antibiotics: The patient does not require                         prophylactic antibiotics. Prior Anticoagulants: The                         patient has taken no anticoagulant or antiplatelet                         agents. ASA Grade Assessment: III - A patient with                         severe systemic disease. After reviewing the risks and                         benefits, the patient was deemed in satisfactory                         condition to undergo the procedure. The anesthesia                         plan was to use general anesthesia. Immediately prior                         to administration of medications, the patient was                         re-assessed for adequacy to receive sedatives. The                         heart rate, respiratory rate, oxygen saturations,                         blood pressure, adequacy of pulmonary ventilation, and                         response to care were monitored throughout the                         procedure. The physical status of the patient was                         re-assessed after the procedure.                        After obtaining informed consent, the colonoscope was                         passed under direct vision. Throughout the procedure,                         the patient's blood pressure, pulse, and oxygen  saturations were monitored continuously. The                         Colonoscope was introduced through the anus and                         advanced to the the cecum, identified by appendiceal                         orifice and ileocecal valve. The colonoscopy was                         performed without difficulty. The patient tolerated                         the procedure well. The quality of the bowel                         preparation was evaluated using the BBPS [facility] Bowel                         Preparation Scale) with scores of: Right Colon = 3,                          Transverse Colon = 3 and Left Colon = 3 (entire mucosa                         seen well with no residual staining, small fragments                         of stool or opaque liquid). The total BBPS score                         equals 9. The ileocecal valve, appendiceal orifice,                         and rectum were photographed. Findings:      The perianal and digital rectal examinations were normal. Pertinent       negatives include normal sphincter tone and no palpable rectal lesions.      Four sessile polyps were found in the rectum and descending colon. The       polyps were 3 to 6 mm in size. These polyps were removed with a cold       snare. Resection and retrieval were complete. Estimated blood loss: none.      Multiple diverticula were found in the entire colon.      Non-bleeding external hemorrhoids were found during retroflexion. The       hemorrhoids were large. Impression:            - Four 3 to 6 mm polyps in the rectum and in the                         descending colon, removed with a cold snare. Resected                         and retrieved.                        -  Diverticulosis in the entire examined colon.                        - Non-bleeding external hemorrhoids. Recommendation:        - Discharge patient to home (with escort).                        - Resume previous diet today.                        - Continue present medications.                        - Await pathology results.                        - Repeat colonoscopy in 3 - 5 years for surveillance                         based on pathology results. Procedure Code(s):     --- Professional ---                        [phone], Colonoscopy, flexible; with removal of                         tumor(s), polyp(s), or other lesion(s) by snare                         technique Diagnosis Code(s):     --- Professional ---                        Z86.010, Personal history of colonic polyps                         D12.8, Benign neoplasm of rectum                        D12.4, Benign neoplasm of descending colon                        K64.4, Residual hemorrhoidal skin tags                        K57.30, Diverticulosis of large intestine without                         perforation or abscess without bleeding CPT copyright 2022 American Medical Association. All rights reserved. The codes documented in this report are preliminary and upon coder review may  be revised to meet current compliance requirements. Dr. Ulyess Mort Lin Landsman MD, MD 09/12/2022 11:48:29 AM This report has been signed electronically. Number of Addenda: 0 Note Initiated On: 09/12/2022 10:52 AM Scope Withdrawal Time: 0 hours 14 minutes 46 seconds  Total Procedure Duration: 0 hours 18 minutes 36 seconds  Estimated Blood Loss:  Estimated blood loss: none.      [facility]

## 2022-09-12 NOTE — H&P (Signed)
Matthew Darby, MD [address]  [place], [place] [address]  Main: [phone]  Fax: [phone] Pager: [phone]  Primary Care Physician:  Turner, Matthew Glow, MD Primary Gastroenterologist:  Dr. Cephas Turner  Pre-Procedure History & Physical: HPI:  Matthew Turner is a 61 y.o. male is here for an colonoscopy.   Past Medical History:  Diagnosis Date   Asthma    dust   Cancer ([place])    prostate; early 57s    Chronic pain    f/u pain clinic in [place]; arms, neck, back-pain clinic in [place]   COVID-19    02/17/20 hosp 5/3-02/27/20 covid pneumonia, 12/31/21   Glaucoma    HLD (hyperlipidemia)    Hypertension    Spinal cord injury, C5-C7 ([place])    C6.C7 in 2008 injury at work he was paralyzed from waist down but walking again;workers comp    Past Surgical History:  Procedure Laterality Date   BACK SURGERY     COLONOSCOPY WITH PROPOFOL N/A 08/18/2019   Procedure: COLONOSCOPY WITH PROPOFOL;  Surgeon: Lin Landsman, MD;  Location: [facility] ENDOSCOPY;  Service: Gastroenterology;  Laterality: N/A;   LUMBAR LAMINECTOMY     PROSTATECTOMY     had radiation no injections h/o prostate pump     Prior to Admission medications   Medication Sig Start Date End Date Taking? Authorizing Provider  albuterol (VENTOLIN HFA) 108 (90 Base) MCG/ACT inhaler INHALE 1-2 PUFFS BY MOUTH EVERY 6 HOURS AS NEEDED FOR WHEEZE OR SHORTNESS OF BREATH 07/28/22  Yes Turner, Matthew Glow, MD  atorvastatin (LIPITOR) 20 MG tablet TAKE 1 TABLET BY MOUTH EVERY DAY 04/03/22  Yes Turner, Matthew Glow, MD  losartan-hydrochlorothiazide (HYZAAR) 50-12.5 MG tablet TAKE 1 TABLET BY MOUTH DAILY IN THE MORNING. STOP PLAIN LOSARTAN 50 04/03/22  Yes Turner, Matthew Glow, MD  XTAMPZA ER 9 MG C12A TAKE ONE CAPSULE BY MOUTH EVERY 12 HOURS 04/01/21  Yes [provider]  amoxicillin-clavulanate (AUGMENTIN) 875-125 MG tablet Take 1 tablet by mouth 2 (two) times daily. With food Patient not taking:  Reported on 09/12/2022 07/18/22   Turner, Matthew Glow, MD  Ascorbic Acid (VITAMIN C) 1000 MG tablet Take 1,000 mg by mouth daily. 12/23/19   [provider]  Azelastine-Fluticasone (DYMISTA) 137-50 MCG/ACT SUSP Place 1 spray into both nostrils 2 (two) times daily as needed. 05/09/22   Turner, Matthew Glow, MD  B Complex Vitamins (VITAMIN B-COMPLEX) TABS Take 1 tablet by mouth daily in the afternoon.  12/23/19   [provider]  cetirizine (ZYRTEC) 10 MG tablet Take 1 tablet (10 mg total) by mouth at bedtime as needed for allergies. 07/18/22   Turner, Matthew Glow, MD  clotrimazole-betamethasone (LOTRISONE) cream Apply 1 Application topically daily. Patient not taking: Reported on 07/18/2022 04/07/22   Turner, Matthew Glow, MD  Cyanocobalamin (B-12) 500 MCG TABS Take 1,000 mcg by mouth daily. 10/07/20   Turner, Matthew Glow, MD  gabapentin (NEURONTIN) 800 MG tablet Take 1 tablet (800 mg total) by mouth in the morning, at noon, in the evening, and at bedtime. Per pain clinic Patient taking differently: Take 400 mg by mouth. Per pain clinic 10/18/21   Turner, Matthew Glow, MD  hydrocortisone 2.5 % lotion APPLY TO NECK TWICE A DAY AS DIRECTED 05/19/22   Turner, Matthew Glow, MD  LUMIGAN 0.01 % SOLN INSTILL 1 DROP INTO BOTH EYES EVERY NIGHT AT BEDTIME 12/15/21   Turner, Matthew Glow, MD  MOVANTIK 25 MG TABS tablet Take 25 mg  by mouth daily. 09/24/21   [provider]  Polyethylene Glycol 400 (BLINK TEARS OP) Apply to eye.    [provider]  Quercetin 250 MG TABS Take 1 tablet (250 mg total) by mouth in the morning and at bedtime. Patient not taking: Reported on 09/12/2022 03/09/20   Turner, Matthew Glow, MD  sodium chloride (OCEAN) 0.65 % SOLN nasal spray Place 2 sprays into both nostrils daily as needed for congestion. 07/18/22   Turner, Matthew Glow, MD  Vitamin D, Ergocalciferol, 50 MCG (2000 UT) CAPS Take 4,000 Units by mouth daily.  03/09/20   Turner, Matthew Glow, MD  Zinc 100 MG TABS Take 1 tablet (100 mg total) by mouth daily. 03/09/20   Turner, Matthew Glow, MD    Allergies as of 07/25/2022   (No Active Allergies)    Family History  Problem Relation Age of Onset   Heart failure Mother    Cancer Father        prostate   Dementia Father    Cancer Brother        prostate   Prostate cancer Brother    Pancreatic cancer Paternal Grandmother    Prostate cancer Other    Prostate cancer Brother        prostate   Dementia Sister     Social History   Socioeconomic History   Marital status: Married    Spouse name: Not on file   Number of children: Not on file   Years of education: Not on file   Highest education level: Not on file  Occupational History   Not on file  Tobacco Use   Smoking status: Former   Smokeless tobacco: Never  Vaping Use   Vaping Use: Never used  Substance and Sexual Activity   Alcohol use: Yes    Comment: Occasionally    Drug use: Not on file   Sexual activity: Not on file  Other Topics Concern   Not on file  Social History Narrative   Former smoker quit 38    Occasional etoh    Married with 2 sons and 1 daughter    From [place]   Retired    Social Determinants of Radio broadcast assistant Strain: [place]  (09/23/2021)   Overall Financial Resource Strain (CARDIA)    Difficulty of Paying Living Expenses: Not hard at all  Food Insecurity: No [place] (09/23/2021)   Hunger Vital Sign    Worried About Running Out of Food in the Last Year: Never true    [place] in the Last Year: Never true  Transportation Needs: No Transportation Needs (09/23/2021)   PRAPARE - Hydrologist (Medical): No    Lack of Transportation (Non-Medical): No  Physical Activity: Insufficiently Active (09/23/2021)   Exercise Vital Sign    Days of Exercise per Week: 4 days    Minutes of Exercise per Session: 20 min  Stress: No Stress Concern Present  (09/23/2021)   [place]    Feeling of Stress : Not at all  Social Connections: Unknown (09/23/2021)   Social Connection and Isolation Panel [NHANES]    Frequency of Communication with Friends and Family: Not on file    Frequency of Social Gatherings with Friends and Family: Not on file    Attends Religious Services: Not on file    Active Member of Clubs or Organizations: Not on file    Attends Club  or Organization Meetings: Not on file    Marital Status: Married  Intimate Partner Violence: Not At Risk (09/23/2021)   Humiliation, Afraid, Rape, and Kick questionnaire    Fear of Current or Ex-Partner: No    Emotionally Abused: No    Physically Abused: No    Sexually Abused: No    Review of Systems: See HPI, otherwise negative ROS  Physical Exam: BP 134/88   Pulse 79   Temp (!) 97.3 F (36.3 C) (Temporal)   Resp 18   Ht '5\' 10"'$  (1.778 m)   Wt 99.8 kg   SpO2 100%   BMI 31.57 kg/m  General:   Alert,  pleasant and cooperative in NAD Head:  Normocephalic and atraumatic. Neck:  Supple; no masses or thyromegaly. Lungs:  Clear throughout to auscultation.    Heart:  Regular rate and rhythm. Abdomen:  Soft, nontender and nondistended. Normal bowel sounds, without guarding, and without rebound.   Neurologic:  Alert and  oriented x4;  grossly normal neurologically.  Impression/Plan: Kirstie Peri is here for an colonoscopy to be performed for h/o colon adenomas  Risks, benefits, limitations, and alternatives regarding  colonoscopy have been reviewed with the patient.  Questions have been answered.  All parties agreeable.   Sherri Sear, MD  09/12/2022, 10:48 AM

## 2022-09-13 ENCOUNTER — Encounter: Payer: Self-pay | Admitting: Gastroenterology

## 2022-09-13 LAB — SURGICAL PATHOLOGY

## 2023-03-15 ENCOUNTER — Ambulatory Visit: Payer: Medicare HMO | Admitting: Family Medicine

## 2023-04-02 NOTE — Patient Instructions (Signed)
It was a pleasure meeting you today. Thank you for allowing me to take part in your health care.  Our goals for today as we discussed include:  Will order ultrasound to check for stones Take ibuprofen 600 mg every 8 hours for pain as needed  Will get some labs today and notify your results.  4 weeks If you have any questions or concerns, please do not hesitate to call the office at [phone].  I look forward to our next visit and until then take care and stay safe.  Regards,   Lesley Galentine, MD   [place]  

## 2023-04-03 ENCOUNTER — Ambulatory Visit: Payer: Medicare HMO | Admitting: Family Medicine

## 2023-04-03 ENCOUNTER — Encounter: Payer: Self-pay | Admitting: Family Medicine

## 2023-04-03 VITALS — BP 130/80 | HR 89 | Temp 98.8°F | Ht 70.0 in | Wt 240.8 lb

## 2023-04-03 DIAGNOSIS — Z6830 Body mass index (BMI) 30.0-30.9, adult: Secondary | ICD-10-CM

## 2023-04-03 DIAGNOSIS — I10 Essential (primary) hypertension: Secondary | ICD-10-CM | POA: Diagnosis not present

## 2023-04-03 DIAGNOSIS — R7309 Other abnormal glucose: Secondary | ICD-10-CM

## 2023-04-03 DIAGNOSIS — J309 Allergic rhinitis, unspecified: Secondary | ICD-10-CM

## 2023-04-03 DIAGNOSIS — E669 Obesity, unspecified: Secondary | ICD-10-CM

## 2023-04-03 DIAGNOSIS — K219 Gastro-esophageal reflux disease without esophagitis: Secondary | ICD-10-CM | POA: Diagnosis not present

## 2023-04-03 DIAGNOSIS — E785 Hyperlipidemia, unspecified: Secondary | ICD-10-CM | POA: Diagnosis not present

## 2023-04-03 DIAGNOSIS — Z125 Encounter for screening for malignant neoplasm of prostate: Secondary | ICD-10-CM

## 2023-04-03 DIAGNOSIS — J4520 Mild intermittent asthma, uncomplicated: Secondary | ICD-10-CM

## 2023-04-03 DIAGNOSIS — C61 Malignant neoplasm of prostate: Secondary | ICD-10-CM

## 2023-04-03 MED ORDER — AZELASTINE-FLUTICASONE 137-50 MCG/ACT NA SUSP: 1.0000 | Freq: Two times a day (BID) | NASAL | 11 refills | Status: AC | PRN

## 2023-04-03 MED ORDER — PANTOPRAZOLE SODIUM 40 MG PO TBEC: 40.0000 mg | DELAYED_RELEASE_TABLET | Freq: Every day | ORAL | 3 refills | Status: DC

## 2023-04-03 MED ORDER — CETIRIZINE HCL 10 MG PO TABS: 10.0000 mg | ORAL_TABLET | Freq: Every evening | ORAL | 3 refills | Status: DC | PRN

## 2023-04-03 NOTE — Progress Notes (Signed)
SUBJECTIVE:   Chief Complaint  Patient presents with   Acute Visit    Acid Reflux   HPI Presents to clinic for acid reflux.  Symptoms present greater than 1 year.  Denies any fevers, abdominal pain, decreased appetite, difficulty swallowing, unintentional weight loss, or nausea/vomiting.  Last EGD 01/2017 by Dr. Monica Becton, no esophagitis.  Acid reflux measures provided.  Patient reports having increased in weight and not compliant with healthy diet.  Hypertension Asymptomatic.  BP at home 130-140/70s.  Takes Hyzaar 50-12.5 mg daily and tolerating well.  Denies headaches, visual changes, chest pain, shortness of breath extremity edema.  Hyperlipidemia Lipitor 20 mg daily tolerating well.  Denies myalgias or joint pain.  Chronic pain History of spinal cord injury C-spine in 2018. Takes gabapentin 400 mg 3 times daily and Xtampza 9 mg twice daily.  Follows with chronic pain management.  Cognitive impairment Followed by neurology at [facility].  Previous slums score 18/30 completed on 08/21.  PERTINENT PMH / PSH: Obesity class Hyperlipidemia Hypertension Chronic pain Cognitive impairment OSA, uses CPAP Lumbar DDD s/p lumbar laminectomy  OBJECTIVE:  BP 130/80   Pulse 89   Temp 98.8 F (37.1 C)   Ht 5\' 10"  (1.778 m)   Wt 240 lb 12.8 oz (109.2 kg)   SpO2 97%   BMI 34.55 kg/m    Physical Exam Vitals reviewed.  Constitutional:      General: He is not in acute distress.    Appearance: Normal appearance. He is obese. He is not ill-appearing, toxic-appearing or diaphoretic.  HENT:     Right Ear: Tympanic membrane, ear canal and external ear normal.     Left Ear: Tympanic membrane, ear canal and external ear normal.  Eyes:     General:        Right eye: No discharge.        Left eye: No discharge.  Neck:     Thyroid: No thyromegaly or thyroid tenderness.  Cardiovascular:     Rate and Rhythm: Normal rate and regular rhythm.     Heart sounds: Normal heart sounds. No  murmur heard. Pulmonary:     Effort: Pulmonary effort is normal.     Breath sounds: Normal breath sounds. No wheezing or rhonchi.  Abdominal:     General: Bowel sounds are normal. There is no distension.     Palpations: Abdomen is soft.     Tenderness: There is no abdominal tenderness. There is no rebound.     Hernia: No hernia is present.  Musculoskeletal:        General: Normal range of motion.     Cervical back: Normal range of motion.  Skin:    General: Skin is warm and dry.  Neurological:     Mental Status: He is alert and oriented to person, place, and time. Mental status is at baseline.  Psychiatric:        Mood and Affect: Mood normal.        Behavior: Behavior normal.        Thought Content: Thought content normal.        Judgment: Judgment normal.       04/03/2023    1:45 PM 05/09/2022    9:20 AM 09/23/2021    8:38 AM 09/22/2020    8:39 AM 05/05/2020    3:10 PM  Depression screen PHQ 2/9  Decreased Interest 0 0 0 0 0  Down, Depressed, Hopeless 0 0 0 0 0  PHQ - 2 Score 0  0 0 0 0  Altered sleeping 0      Tired, decreased energy 3      Change in appetite 0      Feeling bad or failure about yourself  0      Trouble concentrating 0      Moving slowly or fidgety/restless 0      Suicidal thoughts 0      PHQ-9 Score 3      Difficult doing work/chores Not difficult at all           04/03/2023    1:45 PM 05/05/2020    3:11 PM 03/09/2020   10:12 AM  GAD 7 : Generalized Anxiety Score  Nervous, Anxious, on Edge 0 0 1  Control/stop worrying 0 0 0  Worry too much - different things 0 0 0  Trouble relaxing 0 0 0  Restless 0 0 0  Easily annoyed or irritable 0 0 0  Afraid - awful might happen 0 0 1  Total GAD 7 Score 0 0 2  Anxiety Difficulty Not difficult at all Not difficult at all Not difficult at all      ASSESSMENT/PLAN:  Gastroesophageal reflux disease, unspecified whether esophagitis present Assessment & Plan: Chronic.  Recent weight gain.  No red flags. Trial  Protonix 40 mg daily x 4 weeks Refer to GI if symptoms persist Acid reflux education provided  Orders: -     Pantoprazole Sodium; Take 1 tablet (40 mg total) by mouth daily.  Dispense: 30 tablet; Refill: 3  Allergic rhinitis, unspecified seasonality, unspecified trigger Assessment & Plan: Chronic. Refill Azestaline-Fluticasone nasal spray   Orders: -     Azelastine-Fluticasone; Place 1 spray into both nostrils 2 (two) times daily as needed.  Dispense: 23 g; Refill: 11 -     Cetirizine HCl; Take 1 tablet (10 mg total) by mouth at bedtime as needed for allergies.  Dispense: 90 tablet; Refill: 3  Essential hypertension Assessment & Plan: Chronic.  Well-controlled on current medication. Continue Hyzaar 50-12.5 mg daily Check c-Met  Orders: -     Comprehensive metabolic panel  Obesity (BMI 30.0-34.9) -     CBC with Differential/Platelet -     TSH -     Vitamin B12 -     VITAMIN D 25 Hydroxy (Vit-D Deficiency, Fractures)  Hyperlipidemia, unspecified hyperlipidemia type Assessment & Plan: Chronic.  Tolerating statin therapy Continue Lipitor 20 mg daily Check fasting lipids  Orders: -     Lipid panel  Abnormal glucose -     Hemoglobin A1c  Primary malignant neoplasm of prostate [facility]) Assessment & Plan: History of prostate CA status post prostatectomy.  Family history of prostate cancer.  PSA has been undetectable Check PSA yearly   Orders: -     PSA  Intermittent asthma without complication, unspecified asthma severity Assessment & Plan: Chronic.  Uses albuterol as needed. Continue albuterol 1 to 2 puffs every 6 hours as needed for wheezing or shortness of breath.   HCM Recommend shingles vaccine Recommend pneumonia 20 vaccine Colonoscopy due 11/28.   Follow-up sessile polyps 5-year. Tetanus up-to-date Hepatitis C/HIV screening completed Depression/GAD screening completed   PDMP reviewed  Return in about 7 weeks (around 05/22/2023).  Dana Allan, MD

## 2023-04-04 ENCOUNTER — Other Ambulatory Visit: Payer: Self-pay | Admitting: Family Medicine

## 2023-04-04 LAB — PSA: PSA: 0 ng/mL — ABNORMAL LOW (ref 0.10–4.00)

## 2023-04-04 LAB — CBC WITH DIFFERENTIAL/PLATELET
Basophils Absolute: 0.1 10*3/uL (ref 0.0–0.1)
Basophils Relative: 2.9 % (ref 0.0–3.0)
Eosinophils Absolute: 0.2 10*3/uL (ref 0.0–0.7)
Eosinophils Relative: 3.2 % (ref 0.0–5.0)
HCT: 40.3 % (ref 39.0–52.0)
Hemoglobin: 13.4 g/dL (ref 13.0–17.0)
Lymphocytes Relative: 31.7 % (ref 12.0–46.0)
Lymphs Abs: 1.6 10*3/uL (ref 0.7–4.0)
MCHC: 33.2 g/dL (ref 30.0–36.0)
MCV: 91.9 fl (ref 78.0–100.0)
Monocytes Absolute: 0.3 10*3/uL (ref 0.1–1.0)
Monocytes Relative: 6.1 % (ref 3.0–12.0)
Neutro Abs: 2.9 10*3/uL (ref 1.4–7.7)
Neutrophils Relative %: 56.1 % (ref 43.0–77.0)
Platelets: 221 10*3/uL (ref 150.0–400.0)
RBC: 4.38 Mil/uL (ref 4.22–5.81)
RDW: 13.1 % (ref 11.5–15.5)
WBC: 5.1 10*3/uL (ref 4.0–10.5)

## 2023-04-04 LAB — LIPID PANEL
Cholesterol: 174 mg/dL (ref 0–200)
HDL: 56.8 mg/dL
LDL Cholesterol: 100 mg/dL — ABNORMAL HIGH (ref 0–99)
NonHDL: 117.54
Total CHOL/HDL Ratio: 3
Triglycerides: 87 mg/dL (ref 0.0–149.0)
VLDL: 17.4 mg/dL (ref 0.0–40.0)

## 2023-04-04 LAB — COMPREHENSIVE METABOLIC PANEL WITH GFR
ALT: 27 U/L (ref 0–53)
AST: 20 U/L (ref 0–37)
Albumin: 4.3 g/dL (ref 3.5–5.2)
Alkaline Phosphatase: 68 U/L (ref 39–117)
BUN: 13 mg/dL (ref 6–23)
CO2: 27 meq/L (ref 19–32)
Calcium: 9.1 mg/dL (ref 8.4–10.5)
Chloride: 105 meq/L (ref 96–112)
Creatinine, Ser: 1.12 mg/dL (ref 0.40–1.50)
GFR: 69.72 mL/min
Glucose, Bld: 109 mg/dL — ABNORMAL HIGH (ref 70–99)
Potassium: 4.1 meq/L (ref 3.5–5.1)
Sodium: 142 meq/L (ref 135–145)
Total Bilirubin: 1 mg/dL (ref 0.2–1.2)
Total Protein: 6.8 g/dL (ref 6.0–8.3)

## 2023-04-04 LAB — VITAMIN D 25 HYDROXY (VIT D DEFICIENCY, FRACTURES): VITD: 29.83 ng/mL — ABNORMAL LOW (ref 30.00–100.00)

## 2023-04-04 LAB — HEMOGLOBIN A1C: Hgb A1c MFr Bld: 5.7 % (ref 4.6–6.5)

## 2023-04-04 LAB — TSH: TSH: 2.61 u[IU]/mL (ref 0.35–5.50)

## 2023-04-04 LAB — VITAMIN B12: Vitamin B-12: 586 pg/mL (ref 211–911)

## 2023-04-04 MED ORDER — VITAMIN D (ERGOCALCIFEROL) 1.25 MG (50000 UNIT) PO CAPS: 50000.0000 [IU] | ORAL_CAPSULE | ORAL | 1 refills | Status: DC

## 2023-04-14 ENCOUNTER — Encounter: Payer: Self-pay | Admitting: Family Medicine

## 2023-04-14 DIAGNOSIS — J309 Allergic rhinitis, unspecified: Secondary | ICD-10-CM | POA: Insufficient documentation

## 2023-04-14 DIAGNOSIS — K219 Gastro-esophageal reflux disease without esophagitis: Secondary | ICD-10-CM | POA: Insufficient documentation

## 2023-04-14 NOTE — Assessment & Plan Note (Signed)
Chronic.  Recent weight gain.  No red flags. Trial Protonix 40 mg daily x 4 weeks Refer to GI if symptoms persist Acid reflux education provided

## 2023-04-14 NOTE — Assessment & Plan Note (Signed)
Chronic.  Uses albuterol as needed. Continue albuterol 1 to 2 puffs every 6 hours as needed for wheezing or shortness of breath.

## 2023-04-14 NOTE — Assessment & Plan Note (Signed)
Chronic. Refill Azestaline-Fluticasone nasal spray

## 2023-04-14 NOTE — Assessment & Plan Note (Signed)
Chronic.  Well-controlled on current medication. Continue Hyzaar 50-12.5 mg daily Check c-Met

## 2023-04-14 NOTE — Assessment & Plan Note (Signed)
History of prostate CA status post prostatectomy.  Family history of prostate cancer.  PSA has been undetectable Check PSA yearly

## 2023-04-14 NOTE — Assessment & Plan Note (Deleted)
Chronic.  Tolerating statin therapy Continue Lipitor 20 mg daily Check fasting lipids

## 2023-04-14 NOTE — Assessment & Plan Note (Signed)
Chronic.  Tolerating statin therapy Continue Lipitor 20 mg daily Check fasting lipids 

## 2023-04-17 ENCOUNTER — Ambulatory Visit: Payer: Medicare HMO | Admitting: Family Medicine

## 2023-05-22 ENCOUNTER — Ambulatory Visit: Payer: Medicare HMO | Admitting: Family Medicine

## 2023-05-22 ENCOUNTER — Encounter: Payer: Self-pay | Admitting: Family Medicine

## 2023-05-22 VITALS — BP 108/72 | HR 72 | Temp 98.2°F | Resp 16 | Ht 70.0 in | Wt 237.5 lb

## 2023-05-22 DIAGNOSIS — K219 Gastro-esophageal reflux disease without esophagitis: Secondary | ICD-10-CM | POA: Diagnosis not present

## 2023-05-22 DIAGNOSIS — I10 Essential (primary) hypertension: Secondary | ICD-10-CM | POA: Diagnosis not present

## 2023-05-22 DIAGNOSIS — E785 Hyperlipidemia, unspecified: Secondary | ICD-10-CM

## 2023-05-22 DIAGNOSIS — L309 Dermatitis, unspecified: Secondary | ICD-10-CM

## 2023-05-22 DIAGNOSIS — G894 Chronic pain syndrome: Secondary | ICD-10-CM

## 2023-05-22 MED ORDER — PANTOPRAZOLE SODIUM 40 MG PO TBEC: 40.0000 mg | DELAYED_RELEASE_TABLET | Freq: Every day | ORAL | 3 refills | Status: DC

## 2023-05-22 MED ORDER — LOSARTAN POTASSIUM-HCTZ 50-12.5 MG PO TABS: 1.0000 | ORAL_TABLET | Freq: Every day | ORAL | 3 refills | Status: DC

## 2023-05-22 MED ORDER — ATORVASTATIN CALCIUM 20 MG PO TABS: 20.0000 mg | ORAL_TABLET | Freq: Every day | ORAL | 3 refills | Status: DC

## 2023-05-22 MED ORDER — HYDROCORTISONE 2.5 % EX LOTN: TOPICAL_LOTION | Freq: Two times a day (BID) | CUTANEOUS | 0 refills | Status: DC | PRN

## 2023-05-22 NOTE — Patient Instructions (Signed)
It was a pleasure meeting you today. Thank you for allowing me to take part in your health care.  Our goals for today as we discussed include:  Increase water intake  Refills sent for requested medications  Recommend Shingles vaccine.  This is a 2 dose series and can be given at your local pharmacy.  Please talk to your pharmacist about this.   Use Albuterol inhaler for cough  Schedule Medicare Annual Wellness Visit   Follow up as needed   If you have any questions or concerns, please do not hesitate to call the office at [phone].  I look forward to our next visit and until then take care and stay safe.  Regards,   Dana Allan, MD   [facility]

## 2023-05-22 NOTE — Progress Notes (Signed)
SUBJECTIVE:   Chief Complaint  Patient presents with   Establish Care   HPI Presents to clinic to transfer care  No acute concerns today.   Hypertension Asymptomatic. Takes Hyzaar 50-12.5 mg daily and tolerating well.  Denies headaches, visual changes, chest pain, shortness of breath extremity edema.  Requesting refill for medication.  Hyperlipidemia Lipitor 20 mg daily tolerating well.  Denies myalgias or joint pain.  Requesting refill  Chronic pain History of spinal cord injury C-spine in 2018. Takes gabapentin 400 mg 2 times daily and Xtampza 9 mg twice daily.  Follows with chronic pain management.  Cognitive impairment Previously followed by neurology at [facility].  Previous slums score 18/30 completed on 08/21.  PERTINENT PMH / PSH: Obesity class Hyperlipidemia Hypertension Chronic pain Cognitive impairment OSA, uses CPAP Lumbar DDD s/p lumbar laminectomy  OBJECTIVE:  BP 108/72   Pulse 72   Temp 98.2 F (36.8 C)   Resp 16   Ht 5\' 10"  (1.778 m)   Wt 237 lb 8 oz (107.7 kg)   SpO2 98%   BMI 34.08 kg/m    Physical Exam Vitals reviewed.  Constitutional:      General: He is not in acute distress.    Appearance: Normal appearance. He is obese. He is not ill-appearing, toxic-appearing or diaphoretic.  HENT:     Right Ear: Tympanic membrane, ear canal and external ear normal.     Left Ear: Tympanic membrane, ear canal and external ear normal.  Eyes:     General:        Right eye: No discharge.        Left eye: No discharge.  Neck:     Thyroid: No thyromegaly or thyroid tenderness.  Cardiovascular:     Rate and Rhythm: Normal rate and regular rhythm.     Heart sounds: Normal heart sounds. No murmur heard. Pulmonary:     Effort: Pulmonary effort is normal.     Breath sounds: Normal breath sounds. No wheezing or rhonchi.  Abdominal:     General: Bowel sounds are normal. There is no distension.     Palpations: Abdomen is soft.     Tenderness: There is  no abdominal tenderness. There is no rebound.     Hernia: No hernia is present.  Musculoskeletal:        General: Normal range of motion.     Cervical back: Normal range of motion.  Skin:    General: Skin is warm and dry.  Neurological:     Mental Status: He is alert and oriented to person, place, and time. Mental status is at baseline.  Psychiatric:        Mood and Affect: Mood normal.        Behavior: Behavior normal.        Thought Content: Thought content normal.        Judgment: Judgment normal.       05/22/2023   10:22 AM 04/03/2023    1:45 PM 05/09/2022    9:20 AM 09/23/2021    8:38 AM 09/22/2020    8:39 AM  Depression screen PHQ 2/9  Decreased Interest 0 0 0 0 0  Down, Depressed, Hopeless 0 0 0 0 0  PHQ - 2 Score 0 0 0 0 0  Altered sleeping 1 0     Tired, decreased energy 3 3     Change in appetite 0 0     Feeling bad or failure about yourself  0 0  Trouble concentrating 0 0     Moving slowly or fidgety/restless 0 0     Suicidal thoughts 0 0     PHQ-9 Score 4 3     Difficult doing work/chores Not difficult at all Not difficult at all          05/22/2023   10:22 AM 04/03/2023    1:45 PM 05/05/2020    3:11 PM 03/09/2020   10:12 AM  GAD 7 : Generalized Anxiety Score  Nervous, Anxious, on Edge 0 0 0 1  Control/stop worrying 0 0 0 0  Worry too much - different things 0 0 0 0  Trouble relaxing 0 0 0 0  Restless 0 0 0 0  Easily annoyed or irritable 0 0 0 0  Afraid - awful might happen 0 0 0 1  Total GAD 7 Score 0 0 0 2  Anxiety Difficulty Not difficult at all Not difficult at all Not difficult at all Not difficult at all      ASSESSMENT/PLAN:  Essential hypertension Assessment & Plan: Chronic.  Well-controlled on current medication. Refill Hyzaar 50-12.5 mg daily Continue to monitor blood pressure at home.  Orders: -     Losartan Potassium-HCTZ; Take 1 tablet by mouth daily.  Dispense: 90 tablet; Refill: 3  Eczema, unspecified type Assessment &  Plan: Chronic.  No recent flare. Refill hydrocortisone 2.5% lotion.  Orders: -     Hydrocortisone; Apply topically 2 (two) times daily as needed.  Dispense: 118 mL; Refill: 0  Gastroesophageal reflux disease, unspecified whether esophagitis present Assessment & Plan: Chronic.  Refill Protonix 40 mg daily    Orders: -     Pantoprazole Sodium; Take 1 tablet (40 mg total) by mouth daily.  Dispense: 30 tablet; Refill: 3  Hyperlipidemia, unspecified hyperlipidemia type Assessment & Plan: Chronic.  Tolerating statin therapy Refill Lipitor 20 mg daily   Orders: -     Atorvastatin Calcium; Take 1 tablet (20 mg total) by mouth daily.  Dispense: 90 tablet; Refill: 3  Chronic pain syndrome Assessment & Plan: History of spinal cord injury On chronic opioid therapy, gabapentin 400 mg twice daily Follows with pain management   HCM Recommend shingles vaccine Recommend pneumonia 20 vaccine Colonoscopy due 11/28.   Follow-up sessile polyps 5-year. Tetanus up-to-date Hepatitis C/HIV screening completed Depression/GAD screening completed   PDMP reviewed  Return if symptoms worsen or fail to improve, for PCP.  Dana Allan, MD

## 2023-06-03 ENCOUNTER — Encounter: Payer: Self-pay | Admitting: Family Medicine

## 2023-06-03 DIAGNOSIS — L309 Dermatitis, unspecified: Secondary | ICD-10-CM | POA: Insufficient documentation

## 2023-06-03 NOTE — Assessment & Plan Note (Signed)
Chronic.  Refill Protonix 40 mg daily

## 2023-06-03 NOTE — Assessment & Plan Note (Signed)
Chronic.  Well-controlled on current medication. Refill Hyzaar 50-12.5 mg daily Continue to monitor blood pressure at home.

## 2023-06-03 NOTE — Assessment & Plan Note (Signed)
Chronic.  Tolerating statin therapy Refill Lipitor 20 mg daily

## 2023-06-03 NOTE — Assessment & Plan Note (Signed)
History of spinal cord injury On chronic opioid therapy, gabapentin 400 mg twice daily Follows with pain management

## 2023-06-03 NOTE — Assessment & Plan Note (Signed)
Chronic.  No recent flare. Refill hydrocortisone 2.5% lotion.

## 2023-06-13 ENCOUNTER — Telehealth: Payer: Self-pay | Admitting: Family Medicine

## 2023-06-13 NOTE — Telephone Encounter (Signed)
Copied from CRM [id number]. Topic: Medicare AWV >> Jun 13, 2023  9:55 AM Payton Doughty wrote: Reason for CRM: LM 06/13/2023 to schedule AWV   Verlee Rossetti; Care Guide Ambulatory Clinical Support [place] [facility] Direct Dial: [phone]

## 2023-06-15 ENCOUNTER — Telehealth: Payer: Self-pay | Admitting: Family Medicine

## 2023-06-15 ENCOUNTER — Other Ambulatory Visit: Payer: Self-pay | Admitting: Family Medicine

## 2023-06-15 NOTE — Telephone Encounter (Signed)
Pt came into the office stating for the past two weeks he has stop taking his vitamin D medication because it was giving him side and back pain. Pt states now he is feeling better

## 2023-08-02 ENCOUNTER — Telehealth: Payer: Self-pay | Admitting: Family Medicine

## 2023-08-02 NOTE — Telephone Encounter (Signed)
Copied from CRM [id number]. Topic: Medicare AWV >> Aug 02, 2023 11:13 AM Payton Doughty wrote: Reason for BJY:[id number] LVM 08/02/2023 to schedule Annual Wellness Visit   Verlee Rossetti; Care Guide Ambulatory Clinical Support [place] [facility] Direct Dial: [phone]

## 2023-10-18 ENCOUNTER — Other Ambulatory Visit: Payer: Self-pay | Admitting: Family Medicine

## 2023-10-18 DIAGNOSIS — K219 Gastro-esophageal reflux disease without esophagitis: Secondary | ICD-10-CM

## 2023-10-18 DIAGNOSIS — L309 Dermatitis, unspecified: Secondary | ICD-10-CM

## 2023-11-10 ENCOUNTER — Other Ambulatory Visit: Payer: Self-pay | Admitting: Family Medicine

## 2023-11-10 DIAGNOSIS — L309 Dermatitis, unspecified: Secondary | ICD-10-CM

## 2023-12-03 ENCOUNTER — Ambulatory Visit: Payer: Medicare HMO | Admitting: Family Medicine

## 2023-12-03 ENCOUNTER — Ambulatory Visit (INDEPENDENT_AMBULATORY_CARE_PROVIDER_SITE_OTHER): Payer: Medicare HMO

## 2023-12-03 ENCOUNTER — Encounter: Payer: Self-pay | Admitting: Family Medicine

## 2023-12-03 VITALS — BP 112/68 | HR 98 | Temp 98.5°F | Resp 18 | Ht 70.0 in | Wt 232.5 lb

## 2023-12-03 DIAGNOSIS — J4520 Mild intermittent asthma, uncomplicated: Secondary | ICD-10-CM

## 2023-12-03 DIAGNOSIS — J208 Acute bronchitis due to other specified organisms: Secondary | ICD-10-CM | POA: Diagnosis not present

## 2023-12-03 DIAGNOSIS — J9801 Acute bronchospasm: Secondary | ICD-10-CM

## 2023-12-03 LAB — POCT INFLUENZA A/B
Influenza A, POC: NEGATIVE
Influenza B, POC: NEGATIVE

## 2023-12-03 LAB — POC COVID19 BINAXNOW: SARS Coronavirus 2 Ag: NEGATIVE

## 2023-12-03 MED ORDER — BENZONATATE 100 MG PO CAPS: 200.0000 mg | ORAL_CAPSULE | Freq: Three times a day (TID) | ORAL | 0 refills | Status: DC | PRN

## 2023-12-03 MED ORDER — PREDNISONE 20 MG PO TABS: 20.0000 mg | ORAL_TABLET | Freq: Every day | ORAL | 0 refills | Status: AC

## 2023-12-03 NOTE — Assessment & Plan Note (Signed)
Chronic.  Uses albuterol as needed. Continue albuterol 1 to 2 puffs every 6 hours as needed for wheezing or shortness of breath.

## 2023-12-03 NOTE — Patient Instructions (Addendum)
 It was a pleasure meeting you today. Thank you for allowing me to take part in your health care.  Our goals for today as we discussed include:  COVID negative Flu A and B negative. Likely different respiratory virus like RSV.    Will get chest xray today  Restart your Albuterol  inhaler for shortness of breath Start Prednisone  20 mg daily for 5 day Tessalon  Pearls 1 tablet 3 times a day as needed for cough.    You can take Tylenol  and/or Ibuprofen  as needed for fever reduction and pain relief.   For cough: honey 1/2 to 1 teaspoon (you can dilute the honey in water or another fluid).  You can also use guaifenesin and dextromethorphan for cough. You can use a humidifier for chest congestion and cough.  If you don't have a humidifier, you can sit in the bathroom with the hot shower running.      For sore throat: try warm salt water gargles, cepacol lozenges, throat spray, warm tea or water with lemon/honey, popsicles or ice, or OTC cold relief medicine for throat discomfort.   For congestion: take a daily anti-histamine like Zyrtec , Claritin, and a oral decongestant, such as pseudoephedrine.  You can also use Flonase  1-2 sprays in each nostril daily.   It is important to stay hydrated: drink plenty of fluids (water, gatorade/powerade/pedialyte, juices, or teas) to keep your throat moisturized and help further relieve irritation/discomfort.   If any worsening symptoms please follow up with MD.  If difficulty breathing please go to Urgent Care for evaluation.   This is a list of the screening recommended for you and due dates:  Health Maintenance  Topic Date Due   Zoster (Shingles) Vaccine (2 of 2) 10/07/2021   Medicare Annual Wellness Visit  09/23/2022   Flu Shot  05/24/2023   COVID-19 Vaccine (6 - 2024-25 season) 06/24/2023   Colon Cancer Screening  09/13/2027   DTaP/Tdap/Td vaccine (2 - Td or Tdap) 06/03/2031   Pneumococcal Vaccination  Completed   Hepatitis C Screening  Completed    HIV Screening  Completed   HPV Vaccine  Aged Out     If you have any questions or concerns, please do not hesitate to call the office at [phone].  I look forward to our next visit and until then take care and stay safe.  Regards,   Valli Gaw, MD   [facility]

## 2023-12-03 NOTE — Assessment & Plan Note (Signed)
 Symptoms of cough, sneezing, mucus production, and shortness of breath for one week. History of exposure to a sick grandchild. No severe symptoms such as chest pain. Mild wheezing noted on examination. -Order chest X-ray to rule out pneumonia. -Order swabs for RSV, influenza, and COVID-19. -Start on Tessalon  Perles for symptomatic relief of cough. -Advise to continue using Albuterol  inhaler as needed.

## 2023-12-03 NOTE — Progress Notes (Addendum)
 SUBJECTIVE:   Chief Complaint  Patient presents with   Cough    X 1 week green mucus yesterday turned yellow   HPI Patient [resents for an acute visit   Discussed the use of AI scribe software for clinical note transcription with the patient, who gave verbal consent to proceed.  History of Present Illness Matthew Turner is a 63 year old male with asthma who presents with a week-long cough and respiratory symptoms.  He has been experiencing respiratory symptoms for the past week, which began after his five-year-old grandson sneezed in his face. He describes a persistent cough, sneezing, and a sensation of mucus in his chest. The symptoms seem to improve but then return, particularly at night, causing him to be 'up all night coughing and sneezing.'  He experiences shortness of breath and almost passed out at one point. He reports intermittent wheezing. He has a history of asthma, which he refers to as a 'touch of asthma.'  He experienced a fever once during this period, although he did not measure it, but felt warm. He has a history of COVID-19 infection three times and mentions that his current symptoms are similar, with significant mucus buildup. He also reports a history of bronchitis, which he feels this episode resembles.  He is not currently on any prescribed medications but has been using over-the-counter cough medicine with minimal relief. He has an inhaler at home.  His appetite is reduced, and he has not had a full meal since yesterday, although he drinks plenty of water. No vomiting, abdominal pain, diarrhea, or urinary issues. He reports a sore throat, which he attributes to coughing and sneezing, but denies a runny nose, stating he 'blows everything out.'  His grandson had similar but milder symptoms and was taken to urgent care, where he was treated for the flu. He has received his flu, COVID, and pneumonia vaccines, and believes he may have had the RSV vaccine as well.       PERTINENT PMH / PSH: As above  OBJECTIVE:  BP 112/68   Pulse 98   Temp 98.5 F (36.9 C)   Resp 18   Ht 5' 10 (1.778 m)   Wt 232 lb 8 oz (105.5 kg)   SpO2 99%   BMI 33.36 kg/m    Physical Exam Vitals reviewed.  Constitutional:      General: He is not in acute distress.    Appearance: Normal appearance. He is obese. He is not ill-appearing, toxic-appearing or diaphoretic.   Eyes:     General:        Right eye: No discharge.        Left eye: No discharge.    Cardiovascular:     Rate and Rhythm: Normal rate and regular rhythm.     Heart sounds: Normal heart sounds.  Pulmonary:     Effort: Pulmonary effort is normal.     Breath sounds: Wheezing present.  Abdominal:     General: Bowel sounds are normal.   Musculoskeletal:        General: Normal range of motion.     Cervical back: Normal range of motion.   Skin:    General: Skin is warm and dry.   Neurological:     Mental Status: He is alert and oriented to person, place, and time. Mental status is at baseline.   Psychiatric:        Mood and Affect: Mood normal.        Behavior:  Behavior normal.        Thought Content: Thought content normal.        Judgment: Judgment normal.           12/03/2023   10:58 AM 05/22/2023   10:22 AM 04/03/2023    1:45 PM 05/09/2022    9:20 AM 09/23/2021    8:38 AM  Depression screen PHQ 2/9  Decreased Interest 3 0 0 0 0  Down, Depressed, Hopeless 3 0 0 0 0  PHQ - 2 Score 6 0 0 0 0  Altered sleeping 3 1 0    Tired, decreased energy 3 3 3     Change in appetite 3 0 0    Feeling bad or failure about yourself  3 0 0    Trouble concentrating 3 0 0    Moving slowly or fidgety/restless 3 0 0    Suicidal thoughts 0 0 0    PHQ-9 Score 24 4 3     Difficult doing work/chores Somewhat difficult Not difficult at all Not difficult at all        05/22/2023   10:22 AM 04/03/2023    1:45 PM 05/05/2020    3:11 PM 03/09/2020   10:12 AM  GAD 7 : Generalized Anxiety Score  Nervous,  Anxious, on Edge 0 0 0 1  Control/stop worrying 0 0 0 0  Worry too much - different things 0 0 0 0  Trouble relaxing 0 0 0 0  Restless 0 0 0 0  Easily annoyed or irritable 0 0 0 0  Afraid - awful might happen 0 0 0 1  Total GAD 7 Score 0 0 0 2  Anxiety Difficulty Not difficult at all Not difficult at all Not difficult at all Not difficult at all    ASSESSMENT/PLAN:  Acute viral bronchitis Assessment & Plan: Symptoms of cough, sneezing, mucus production, and shortness of breath for one week. History of exposure to a sick grandchild. No severe symptoms such as chest pain. Mild wheezing noted on examination. -Order chest X-ray to rule out pneumonia. -Order swabs for RSV, influenza, and COVID-19. -Start on Tessalon  Perles for symptomatic relief of cough. -Advise to continue using Albuterol  inhaler as needed.  Orders: -     POC COVID-19 BinaxNow -     POCT Influenza A/B -     DG Chest 2 View -     predniSONE ; Take 1 tablet (20 mg total) by mouth daily with breakfast for 5 days.  Dispense: 5 tablet; Refill: 0 -     Benzonatate ; Take 2 capsules (200 mg total) by mouth 3 (three) times daily as needed for cough.  Dispense: 20 capsule; Refill: 0  Intermittent asthma without complication, unspecified asthma severity Assessment & Plan: Chronic.  Uses albuterol  as needed. Continue albuterol  1 to 2 puffs every 6 hours as needed for wheezing or shortness of breath.    Bronchospasm -     Respiratory virus panel    PDMP reviewed  Return if symptoms worsen or fail to improve, for PCP.  Glenys Ferrari, MD

## 2023-12-04 ENCOUNTER — Telehealth: Payer: Self-pay

## 2023-12-04 NOTE — Telephone Encounter (Signed)
Spoke with pt and wanted to know why he still had all this congestion but nothing showed up on the chest x ray. I explained to pt that the chest x ray was done to rule out pneumonia and that it was normal. I also let him know that the respiratory panel is still pending so it could be that he has a respiratory infection or a sinus infection. Pt gave a verbal understanding.

## 2023-12-04 NOTE — Telephone Encounter (Signed)
Pt spoke to [facility] see previous note.

## 2023-12-04 NOTE — Telephone Encounter (Signed)
Copied from CRM [id number]. Topic: General - Call Back - No Documentation >> Dec 04, 2023 12:03 PM Corin V wrote: Reason for CRM: Patient stated he missed a call from a Kelly. No note visibile in chart. Please call patient back.

## 2023-12-04 NOTE — Telephone Encounter (Signed)
Copied from CRM [id number]. Topic: Clinical - Lab/Test Results >> Dec 04, 2023  8:48 AM Prudencio Pair wrote: Reason for CRM: Patient calling in regards to labs & imaging results. Provided pt with results per notes from chart. Pt still has other questions & concerns that he would like to speak with nurse about. Please give pt a call back. CB #: [phone].

## 2023-12-06 LAB — RESPIRATORY VIRUS PANEL
Adenovirus B: NOT DETECTED
HUMAN PARAINFLU VIRUS 1: NOT DETECTED
HUMAN PARAINFLU VIRUS 2: NOT DETECTED
HUMAN PARAINFLU VIRUS 3: NOT DETECTED
INFLUENZA A SUBTYPE H1: NOT DETECTED
INFLUENZA A SUBTYPE H3: NOT DETECTED
Influenza A: NOT DETECTED
Influenza B: NOT DETECTED
Metapneumovirus: NOT DETECTED
Respiratory Syncytial Virus A: DETECTED — AB
Respiratory Syncytial Virus B: NOT DETECTED
Rhinovirus: NOT DETECTED

## 2024-02-22 ENCOUNTER — Encounter: Payer: Self-pay | Admitting: Family Medicine

## 2024-02-22 ENCOUNTER — Ambulatory Visit: Payer: Self-pay

## 2024-02-22 ENCOUNTER — Ambulatory Visit (INDEPENDENT_AMBULATORY_CARE_PROVIDER_SITE_OTHER): Admitting: Family Medicine

## 2024-02-22 ENCOUNTER — Ambulatory Visit
Admission: RE | Admit: 2024-02-22 | Discharge: 2024-02-22 | Disposition: A | Discharge status: Home / Self Care | Source: Ambulatory Visit | Attending: Family Medicine

## 2024-02-22 VITALS — BP 130/76 | HR 72 | Temp 98.2°F | Ht 70.0 in | Wt 230.8 lb

## 2024-02-22 DIAGNOSIS — R1032 Left lower quadrant pain: Secondary | ICD-10-CM | POA: Insufficient documentation

## 2024-02-22 DIAGNOSIS — R109 Unspecified abdominal pain: Secondary | ICD-10-CM | POA: Insufficient documentation

## 2024-02-22 LAB — CBC WITH DIFFERENTIAL/PLATELET
Basophils Absolute: 0 10*3/uL (ref 0.0–0.2)
Basos: 1 %
EOS (ABSOLUTE): 0.1 10*3/uL (ref 0.0–0.4)
Eos: 2 %
Hematocrit: 43.1 % (ref 37.5–51.0)
Hemoglobin: 14.7 g/dL (ref 13.0–17.7)
Immature Grans (Abs): 0 10*3/uL (ref 0.0–0.1)
Immature Granulocytes: 0 %
Lymphocytes Absolute: 1.7 10*3/uL (ref 0.7–3.1)
Lymphs: 36 %
MCH: 30.9 pg (ref 26.6–33.0)
MCHC: 34.1 g/dL (ref 31.5–35.7)
MCV: 91 fL (ref 79–97)
Monocytes Absolute: 0.3 10*3/uL (ref 0.1–0.9)
Monocytes: 6 %
Neutrophils Absolute: 2.6 10*3/uL (ref 1.4–7.0)
Neutrophils: 55 %
Platelets: 241 10*3/uL (ref 150–450)
RBC: 4.76 x10E6/uL (ref 4.14–5.80)
RDW: 12.5 % (ref 11.6–15.4)
WBC: 4.8 10*3/uL (ref 3.4–10.8)

## 2024-02-22 LAB — COMPREHENSIVE METABOLIC PANEL WITH GFR
ALT: 26 U/L (ref 0–53)
AST: 17 U/L (ref 0–37)
Albumin: 4.7 g/dL (ref 3.5–5.2)
Alkaline Phosphatase: 72 U/L (ref 39–117)
BUN: 10 mg/dL (ref 6–23)
CO2: 31 meq/L (ref 19–32)
Calcium: 9.4 mg/dL (ref 8.4–10.5)
Chloride: 99 meq/L (ref 96–112)
Creatinine, Ser: 1.11 mg/dL (ref 0.40–1.50)
GFR: 70.03 mL/min (ref >60.00)
Glucose, Bld: 103 mg/dL — ABNORMAL HIGH (ref 70–99)
Potassium: 3.5 meq/L (ref 3.5–5.1)
Sodium: 138 meq/L (ref 135–145)
Total Bilirubin: 1.2 mg/dL (ref 0.2–1.2)
Total Protein: 7.5 g/dL (ref 6.0–8.3)

## 2024-02-22 LAB — URINALYSIS, ROUTINE W REFLEX MICROSCOPIC
Bilirubin Urine: NEGATIVE
Hgb urine dipstick: NEGATIVE
Ketones, ur: NEGATIVE
Leukocytes,Ua: NEGATIVE
Nitrite: NEGATIVE
Specific Gravity, Urine: 1.02 (ref 1.000–1.030)
Total Protein, Urine: NEGATIVE
Urine Glucose: NEGATIVE
Urobilinogen, UA: 1 (ref 0.0–1.0)
WBC, UA: NONE SEEN (ref 0–?)
pH: 6 (ref 5.0–8.0)

## 2024-02-22 LAB — POCT URINALYSIS DIPSTICK
Bilirubin, UA: NEGATIVE
Glucose, UA: NEGATIVE
Ketones, UA: NEGATIVE
Leukocytes, UA: NEGATIVE
Nitrite, UA: NEGATIVE
Protein, UA: NEGATIVE
Spec Grav, UA: 1.02 (ref 1.010–1.025)
Urobilinogen, UA: 1 U/dL
pH, UA: 6 (ref 5.0–8.0)

## 2024-02-22 NOTE — Addendum Note (Signed)
 Addended by: Thressa Flora D on: 02/22/2024 02:35 PM   Modules accepted: Orders

## 2024-02-22 NOTE — Addendum Note (Signed)
 Addended by: Raylie Maddison M on: 02/22/2024 03:56 PM   Modules accepted: Orders

## 2024-02-22 NOTE — Progress Notes (Signed)
 SUBJECTIVE:   Chief Complaint  Patient presents with   Flank Pain   Constipation   HPI Presents for acute visit  Discussed the use of AI scribe software for clinical note transcription with the patient, who gave verbal consent to proceed.  History of Present Illness Matthew Turner is a 63 year old male with a history of kidney stones who presents with right-sided back pain.  He has been experiencing right-sided back pain for the past three days. The pain extends from the right side to the back and feels like it is 'trying to cross my back.' It is different from his usual back pain and is mostly present at night. No recent trauma, falls, or changes in activity have occurred that could have triggered the pain.  He has a history of kidney stones, although he has never experienced pain from them before and has not passed them. No hematuria or urinary issues. He drinks lots of fluids regularly.  He experiences constipation, which he managed to relieve somewhat before the visit, although the bowel movement was not as substantial as usual. He typically has bowel movements twice a day and takes Senna regularly. No diarrhea, changes in diet, or bleeding during bowel movements.  He experiences numbness and tingling in his leg, which he attributes to a spinal cord injury. This sensation had previously subsided but has recently returned. He is currently taking gabapentin .  No fever, nausea, vomiting, or pain during breathing. No changes in his medications.    PERTINENT PMH / PSH: As above  OBJECTIVE:  BP 130/76   Pulse 72   Temp 98.2 F (36.8 C) (Oral)   Ht 5\' 10"  (1.778 m)   Wt 230 lb 12.8 oz (104.7 kg)   BMI 33.12 kg/m    Physical Exam Vitals reviewed.  Constitutional:      General: He is not in acute distress.    Appearance: Normal appearance. He is normal weight. He is not ill-appearing, toxic-appearing or diaphoretic.  Eyes:     General:        Right eye: No discharge.         Left eye: No discharge.  Cardiovascular:     Rate and Rhythm: Normal rate and regular rhythm.     Heart sounds: Normal heart sounds.  Pulmonary:     Effort: Pulmonary effort is normal.     Breath sounds: Normal breath sounds.  Abdominal:     General: Bowel sounds are normal.     Tenderness: There is abdominal tenderness in the left upper quadrant. There is left CVA tenderness. There is no guarding. Negative signs include Murphy's sign and psoas sign.  Musculoskeletal:        General: Normal range of motion.     Cervical back: Normal range of motion.  Skin:    General: Skin is warm and dry.  Neurological:     Mental Status: He is alert and oriented to person, place, and time. Mental status is at baseline.  Psychiatric:        Mood and Affect: Mood normal.        Behavior: Behavior normal.        Thought Content: Thought content normal.        Judgment: Judgment normal.           12/03/2023   10:58 AM 05/22/2023   10:22 AM 04/03/2023    1:45 PM 05/09/2022    9:20 AM 09/23/2021    8:38 AM  Depression screen PHQ 2/9  Decreased Interest 3 0 0 0 0  Down, Depressed, Hopeless 3 0 0 0 0  PHQ - 2 Score 6 0 0 0 0  Altered sleeping 3 1 0    Tired, decreased energy 3 3 3     Change in appetite 3 0 0    Feeling bad or failure about yourself  3 0 0    Trouble concentrating 3 0 0    Moving slowly or fidgety/restless 3 0 0    Suicidal thoughts 0 0 0    PHQ-9 Score 24 4 3     Difficult doing work/chores Somewhat difficult Not difficult at all Not difficult at all        05/22/2023   10:22 AM 04/03/2023    1:45 PM 05/05/2020    3:11 PM 03/09/2020   10:12 AM  GAD 7 : Generalized Anxiety Score  Nervous, Anxious, on Edge 0 0 0 1  Control/stop worrying 0 0 0 0  Worry too much - different things 0 0 0 0  Trouble relaxing 0 0 0 0  Restless 0 0 0 0  Easily annoyed or irritable 0 0 0 0  Afraid - awful might happen 0 0 0 1  Total GAD 7 Score 0 0 0 2  Anxiety Difficulty Not difficult at  all Not difficult at all Not difficult at all Not difficult at all    ASSESSMENT/PLAN:  Left lower quadrant pain Assessment & Plan: New onset x 3 days.  Initially started left lower back now radiating to lower abdomen.  Feels like a ripping pain. Left lower quadrant pain with tenderness on exam. No fevers, weight loss.     Orders: -     CT ABDOMEN PELVIS WO CONTRAST -     Comprehensive metabolic panel with GFR -     CBC with Differential/Platelet  Acute left flank pain Assessment & Plan: Acute right-sided back pain, sharp at night. Differential includes musculoskeletal strain or nephrolithiasis. No fever, gross hematuria, or dysuria. - Order urinalysis to evaluate for nephrolithiasis or infection. - Check labs  Orders: -     POCT urinalysis dipstick -     Urinalysis, Routine w reflex microscopic    PDMP reviewed  No follow-ups on file.  Valli Gaw, MD

## 2024-02-22 NOTE — Assessment & Plan Note (Signed)
 New onset x 3 days.  Initially started left lower back now radiating to lower abdomen.  Feels like a ripping pain. Left lower quadrant pain with tenderness on exam. No fevers, weight loss.

## 2024-02-22 NOTE — Patient Instructions (Addendum)
 It was a pleasure meeting you today. Thank you for allowing me to take part in your health care.  Our goals for today as we discussed include:  We will get some labs today.  If they are abnormal or we need to do something about them, I will call you.  If they are normal, I will send you a message on MyChart (if it is active) or a letter in the mail.  If you don't hear from us  in 2 weeks, please call the office at the number below.   Will get imaging of abdomen today Go to [facility] entrance [address]    This is a list of the screening recommended for you and due dates:  Health Maintenance  Topic Date Due   Zoster (Shingles) Vaccine (2 of 2) 10/07/2021   Medicare Annual Wellness Visit  09/23/2022   COVID-19 Vaccine (6 - 2024-25 season) 06/24/2023   Flu Shot  05/23/2024   Colon Cancer Screening  09/13/2027   DTaP/Tdap/Td vaccine (2 - Td or Tdap) 06/03/2031   Pneumococcal Vaccination  Completed   Hepatitis C Screening  Completed   HIV Screening  Completed   HPV Vaccine  Aged Out   Meningitis B Vaccine  Aged Out      If you have any questions or concerns, please do not hesitate to call the office at [phone].  I look forward to our next visit and until then take care and stay safe.  Regards,   Valli Gaw, MD   [facility]

## 2024-02-22 NOTE — Telephone Encounter (Signed)
  Chief Complaint: left flank pain Symptoms: left flank pain wraps around to left lower back (moderate), constipation Frequency: x 3 days, comes and goes but more constant during the daytime Pertinent Negatives: Patient denies injury, burning with urination, blood in urine, nausea, vomiting, diarrhea, fever, urinary frequency Disposition: [] ED /[] Urgent Care (no appt availability in office) / [x] Appointment(In office/virtual)/ []  [place] [facility]/ [] Home Care/ [] Refused Recommended Disposition /[] [place] [facility]/ []  Follow-up with PCP Additional Notes: Patient states he takes Xtampza  and gabapentin , he states he is able to sleep thru the pain with those medications. Patient with a history of kidney stones. Patient also states he think this may be related to constipation as he has not had a BM in 2 days.  Copied from CRM [id number]. Topic: Clinical - Red Word Triage >> Feb 22, 2024  8:07 AM Howard Macho wrote: Red Word that prompted transfer to Nurse Triage: patient called stating he is having left side pain for three days Reason for Disposition  MODERATE pain (e.g., interferes with normal activities or awakens from sleep)  Answer Assessment - Initial Assessment Questions 1. LOCATION: "Where does it hurt?" (e.g., left, right)     Left flank and left lower back.  2. ONSET: "When did the pain start?"     X 3 days.  3. SEVERITY: "How bad is the pain?" (e.g., Scale 1-10; mild, moderate, or severe)   - MILD (1-3): doesn't interfere with normal activities    - MODERATE (4-7): interferes with normal activities or awakens from sleep    - SEVERE (8-10): excruciating pain and patient unable to do normal activities (stays in bed)       7/10 and last night it was 10/10.  4. PATTERN: "Does the pain come and go, or is it constant?"      Comes and goes, constant in daytime.  5. CAUSE: "What do you think is causing the pain?"     Patient states in the past he has had kidney stones but states to  this day he does not think has passed any.  6. OTHER SYMPTOMS:  "Do you have any other symptoms?" (e.g., fever, abdomen pain, vomiting, leg weakness, burning with urination, blood in urine)     Constipation (last BM 2 days ago).  7. PREGNANCY:  "Is there any chance you are pregnant?" "When was your last menstrual period?"     N/A.  Protocols used: Flank Pain-A-AH

## 2024-02-22 NOTE — Assessment & Plan Note (Signed)
 Acute right-sided back pain, sharp at night. Differential includes musculoskeletal strain or nephrolithiasis. No fever, gross hematuria, or dysuria. - Order urinalysis to evaluate for nephrolithiasis or infection. - Check labs

## 2024-03-24 ENCOUNTER — Other Ambulatory Visit: Payer: Self-pay | Admitting: Family Medicine

## 2024-03-24 DIAGNOSIS — L309 Dermatitis, unspecified: Secondary | ICD-10-CM

## 2024-03-29 ENCOUNTER — Other Ambulatory Visit: Payer: Self-pay | Admitting: Family Medicine

## 2024-03-29 DIAGNOSIS — J309 Allergic rhinitis, unspecified: Secondary | ICD-10-CM

## 2024-04-16 ENCOUNTER — Telehealth: Payer: Self-pay | Admitting: *Deleted

## 2024-04-16 NOTE — Telephone Encounter (Signed)
 This the patient I discussed with you today concerning changing DX code for RSV panel to J98.01 for Broncho spasms. Patient has [employer] and if not changed patient will have bill for  1,014.88. Date of test 12/03/2023.

## 2024-04-17 NOTE — Telephone Encounter (Signed)
 Notified patient that the RSV panel has been re coded and sent to [employer] with request to re file to insurance.

## 2024-05-07 ENCOUNTER — Ambulatory Visit: Payer: Medicare HMO | Admitting: *Deleted

## 2024-05-07 VITALS — Ht 70.0 in | Wt 210.0 lb

## 2024-05-07 DIAGNOSIS — Z0000 Encounter for general adult medical examination without abnormal findings: Secondary | ICD-10-CM | POA: Diagnosis not present

## 2024-05-07 NOTE — Progress Notes (Signed)
 Subjective:   Matthew Turner is a 63 y.o. who presents for a Medicare Wellness preventive visit.  As a reminder, Annual Wellness Visits don't include a physical exam, and some assessments may be limited, especially if this visit is performed virtually. We may recommend an in-person follow-up visit with your provider if needed.  Visit Complete: Virtual I connected with  Matthew Turner on 05/07/24 by a audio enabled telemedicine application and verified that I am speaking with the correct person using two identifiers.  Patient Location: Home  Provider Location: Home Office  I discussed the limitations of evaluation and management by telemedicine. The patient expressed understanding and agreed to proceed.  Vital Signs: Because this visit was a virtual/telehealth visit, some criteria may be missing or patient reported. Any vitals not documented were not able to be obtained and vitals that have been documented are patient reported.  VideoDeclined- This patient declined Librarian, academic. Therefore the visit was completed with audio only.  Persons Participating in Visit: Patient.  AWV Questionnaire: Yes: Patient Medicare AWV questionnaire was completed by the patient on 05/07/24; I have confirmed that all information answered by patient is correct and no changes since this date.  Cardiac Risk Factors include: advanced age (>16men, >79 women);male gender;hypertension;dyslipidemia;obesity (BMI >30kg/m2)     Objective:    Today's Vitals   05/07/24 0813 05/07/24 0814  Weight: 210 lb (95.3 kg)   Height: 5' 10 (1.778 m)   PainSc:  7    Body mass index is 30.13 kg/m.     05/07/2024    8:34 AM 09/12/2022   10:48 AM 09/23/2021    8:40 AM 09/22/2020    8:40 AM 02/23/2020   10:58 PM 02/23/2020    4:29 PM 09/22/2019    8:55 AM  Advanced Directives  Does Patient Have a Medical Advance Directive? No No No No No No No  Would patient like information on creating  a medical advance directive? No - Patient declined  No - Patient declined No - Patient declined No - Patient declined  Yes (MAU/Ambulatory/Procedural Areas - Information given)    Current Medications (verified) Outpatient Encounter Medications as of 05/07/2024  Medication Sig   albuterol  (VENTOLIN  HFA) 108 (90 Base) MCG/ACT inhaler INHALE 1-2 PUFFS BY MOUTH EVERY 6 HOURS AS NEEDED FOR WHEEZE OR SHORTNESS OF BREATH   Ascorbic Acid  (VITAMIN C) 1000 MG tablet Take 1,000 mg by mouth daily.   atorvastatin  (LIPITOR) 20 MG tablet Take 1 tablet (20 mg total) by mouth daily.   Azelastine -Fluticasone  (DYMISTA ) 137-50 MCG/ACT SUSP Place 1 spray into both nostrils 2 (two) times daily as needed.   B Complex Vitamins (VITAMIN B-COMPLEX) TABS Take 1 tablet by mouth daily in the afternoon.    cetirizine  (ZYRTEC ) 10 MG tablet TAKE 1 TABLET (10 MG TOTAL) BY MOUTH AT BEDTIME AS NEEDED FOR ALLERGIES.   clotrimazole -betamethasone  (LOTRISONE ) cream Apply 1 Application topically daily.   Cyanocobalamin  (B-12) 500 MCG TABS Take 1,000 mcg by mouth daily.   gabapentin  (NEURONTIN ) 400 MG capsule Take 400 mg by mouth 2 (two) times daily.   hydrocortisone  2.5 % lotion APPLY TOPICALLY 2 TIMES DAILY AS NEEDED.   losartan -hydrochlorothiazide (HYZAAR) 50-12.5 MG tablet Take 1 tablet by mouth daily.   LUMIGAN  0.01 % SOLN INSTILL 1 DROP INTO BOTH EYES EVERY NIGHT AT BEDTIME   pantoprazole  (PROTONIX ) 40 MG tablet TAKE 1 TABLET BY MOUTH EVERY DAY   XTAMPZA  ER 9 MG C12A TAKE ONE CAPSULE BY MOUTH  EVERY 12 HOURS   Zinc  100 MG TABS Take 1 tablet (100 mg total) by mouth daily.   benzonatate  (TESSALON  PERLES) 100 MG capsule Take 2 capsules (200 mg total) by mouth 3 (three) times daily as needed for cough. (Patient not taking: Reported on 05/07/2024)   senna (SENOKOT) 8.6 MG TABS tablet Take 1 tablet by mouth daily. (Patient not taking: Reported on 05/07/2024)   sodium chloride  (OCEAN) 0.65 % SOLN nasal spray Place 2 sprays into both  nostrils daily as needed for congestion. (Patient not taking: Reported on 05/07/2024)   No facility-administered encounter medications on file as of 05/07/2024.    Allergies (verified) Patient has no active allergies.   History: Past Medical History:  Diagnosis Date   Asthma    dust   Cancer (HCC)    prostate; early 66s    Chronic pain    f/u pain clinic in [place]; arms, neck, back-pain clinic in [place]   COVID-19    02/17/20 hosp 5/3-02/27/20 covid pneumonia, 12/31/21   Glaucoma    HLD (hyperlipidemia)    Hypertension    Spinal cord injury, C5-C7 (HCC)    C6.C7 in 2008 injury at work he was paralyzed from waist down but walking again;workers comp   Past Surgical History:  Procedure Laterality Date   BACK SURGERY     COLONOSCOPY WITH PROPOFOL  N/A 08/18/2019   Procedure: COLONOSCOPY WITH PROPOFOL ;  Surgeon: Unk Corinn Skiff, MD;  Location: [facility] ENDOSCOPY;  Service: Gastroenterology;  Laterality: N/A;   COLONOSCOPY WITH PROPOFOL  N/A 09/12/2022   Procedure: COLONOSCOPY WITH PROPOFOL ;  Surgeon: Unk Corinn Skiff, MD;  Location: [facility] ENDOSCOPY;  Service: Gastroenterology;  Laterality: N/A;   LUMBAR LAMINECTOMY     PROSTATECTOMY     had radiation no injections h/o prostate pump    Family History  Problem Relation Age of Onset   Heart failure Mother    Cancer Father        prostate   Dementia Father    Cancer Brother        prostate   Prostate cancer Brother    Pancreatic cancer Paternal Grandmother    Prostate cancer Other    Prostate cancer Brother        prostate   Dementia Sister    Social History   Socioeconomic History   Marital status: Married    Spouse name: Not on file   Number of children: Not on file   Years of education: Not on file   Highest education level: 9th grade  Occupational History   Not on file  Tobacco Use   Smoking status: Former    Types: Cigarettes   Smokeless tobacco: Never  Vaping Use   Vaping status: Never Used  Substance and Sexual  Activity   Alcohol use: Yes    Comment: Occasionally    Drug use: Never   Sexual activity: Not on file  Other Topics Concern   Not on file  Social History Narrative   Former smoker quit 38    Occasional etoh    Married with 2 sons and 1 daughter    From [place]   Retired    Social Drivers of Corporate investment banker Strain: Low Risk  (05/07/2024)   Overall Financial Resource Strain (CARDIA)    Difficulty of Paying Living Expenses: Not very hard  Food Insecurity: No Food Insecurity (05/07/2024)   Hunger Vital Sign    Worried About Running Out of Food in the Last Year: Never true  Ran Out of Food in the Last Year: Never true  Recent Concern: Food Insecurity - Food Insecurity Present (05/07/2024)   Hunger Vital Sign    Worried About Running Out of Food in the Last Year: Never true    Ran Out of Food in the Last Year: Sometimes true  Transportation Needs: No Transportation Needs (05/07/2024)   PRAPARE - Administrator, Civil Service (Medical): No    Lack of Transportation (Non-Medical): No  Physical Activity: Inactive (05/07/2024)   Exercise Vital Sign    Days of Exercise per Week: 0 days    Minutes of Exercise per Session: 0 min  Stress: No Stress Concern Present (05/07/2024)   [employer] of Occupational Health - Occupational Stress Questionnaire    Feeling of Stress: Not at all  Social Connections: Moderately Integrated (05/07/2024)   Social Connection and Isolation Panel    Frequency of Communication with Friends and Family: More than three times a week    Frequency of Social Gatherings with Friends and Family: More than three times a week    Attends Religious Services: More than 4 times per year    Active Member of [employer] or Organizations: No    Attends Engineer, structural: Never    Marital Status: Married    Tobacco Counseling Counseling given: Not Answered    Clinical Intake:  Pre-visit preparation completed: Yes  Pain : 0-10 Pain Score:  7  Pain Type: Chronic pain Pain Location: Hand Pain Descriptors / Indicators: Nagging, Throbbing Pain Onset: More than a month ago Pain Frequency: Intermittent     BMI - recorded: 30.13 Nutritional Risks: None Diabetes: No  Lab Results  Component Value Date   HGBA1C 5.7 04/03/2023   HGBA1C 5.7 05/09/2022   HGBA1C 5.7 10/18/2021     How often do you need to have someone help you when you read instructions, pamphlets, or other written materials from your doctor or pharmacy?: 1 - Never  Interpreter Needed?: No  Information entered by :: R. Nashika Coker LPN   Activities of Daily Living     05/07/2024   12:59 AM  In your present state of health, do you have any difficulty performing the following activities:  Hearing? 0  Vision? 0  Comment readers  Difficulty concentrating or making decisions? 0  Walking or climbing stairs? 0  Dressing or bathing? 0  Doing errands, shopping? 0  Preparing Food and eating ? N  Using the Toilet? N  In the past six months, have you accidently leaked urine? N  Do you have problems with loss of bowel control? N  Managing your Medications? N  Managing your Finances? N  Housekeeping or managing your Housekeeping? N    Patient Care Team: Unk Corinn Skiff, MD as Consulting Physician (Gastroenterology)  I have updated your Care Teams any recent Medical Services you may have received from other providers in the past year.     Assessment:   This is a routine wellness examination for Matthew Turner.  Hearing/Vision screen Hearing Screening - Comments:: No issues Vision Screening - Comments:: readers   Goals Addressed             This Visit's Progress    Patient Stated       Wants to lose more weight and get something done about is foot       Depression Screen     05/07/2024    8:26 AM 12/03/2023   10:58 AM 05/22/2023   10:22 AM  04/03/2023    1:45 PM 05/09/2022    9:20 AM 04/07/2022   11:10 AM 09/23/2021    8:38 AM  PHQ 2/9 Scores  PHQ - 2  Score 0 6 0 0 0  0  PHQ- 9 Score 4 24 4 3      Exception Documentation      Patient refusal     Fall Risk     05/07/2024   12:59 AM 02/22/2024    1:33 PM 12/03/2023   10:57 AM 05/22/2023   10:22 AM 04/03/2023    1:45 PM  Fall Risk   Falls in the past year? 0  0 0 0  Number falls in past yr: 0 0 0 0 0  Injury with Fall? 0 0 0 0 0  Risk for fall due to : No Fall Risks No Fall Risks No Fall Risks No Fall Risks No Fall Risks  Follow up Falls evaluation completed;Falls prevention discussed Falls evaluation completed Falls evaluation completed;Education provided Falls evaluation completed Falls evaluation completed    MEDICARE RISK AT HOME:  Medicare Risk at Home Any stairs in or around the home?: (Patient-Rptd) Yes If so, are there any without handrails?: (Patient-Rptd) No Home free of loose throw rugs in walkways, pet beds, electrical cords, etc?: (Patient-Rptd) No Adequate lighting in your home to reduce risk of falls?: (Patient-Rptd) Yes Life alert?: (Patient-Rptd) No Use of a cane, walker or w/c?: (Patient-Rptd) No Grab bars in the bathroom?: (Patient-Rptd) No Shower chair or bench in shower?: (Patient-Rptd) No Elevated toilet seat or a handicapped toilet?: (Patient-Rptd) No  TIMED UP AND GO:  Was the test performed?  No  Cognitive Function: 6CIT completed        05/07/2024    8:39 AM 05/07/2024    8:36 AM 09/23/2021    8:44 AM 09/22/2019    8:51 AM  6CIT Screen  What Year?  0 points 0 points 0 points  What month?  0 points 0 points 0 points  What time?  0 points 0 points 0 points  Count back from 20  0 points 2 points 0 points  Months in reverse  2 points 4 points 0 points  Repeat phrase 0 points 2 points 4 points 0 points  Total Score  4 points 10 points 0 points    Immunizations Immunization History  Administered Date(s) Administered   Influenza Inj Mdck Quad Pf 06/26/2019, 06/12/2022   Influenza,inj,Quad PF,6+ Mos 10/07/2020, 10/07/2021   PFIZER Comirnaty(Gray  Top)Covid-19 Tri-Sucrose Vaccine 06/21/2020   PFIZER(Purple Top)SARS-COV-2 Vaccination 01/31/2020, 05/29/2020, 06/21/2020   PNEUMOCOCCAL CONJUGATE-20 10/07/2021   Pfizer Covid-19 Vaccine Bivalent Booster 21yrs & up 08/12/2021   Pneumococcal Conjugate-13 11/11/2019   RSV IGIV 06/12/2022   Tdap 06/02/2021   Zoster Recombinant(Shingrix ) 08/12/2021    Screening Tests Health Maintenance  Topic Date Due   Zoster Vaccines- Shingrix  (2 of 2) 10/07/2021   Medicare Annual Wellness (AWV)  09/23/2022   COVID-19 Vaccine (6 - 2024-25 season) 06/24/2023   INFLUENZA VACCINE  05/23/2024   Colonoscopy  09/13/2027   DTaP/Tdap/Td (2 - Td or Tdap) 06/03/2031   Pneumococcal Vaccine: 50+ Years  Completed   Hepatitis C Screening  Completed   HIV Screening  Completed   Hepatitis B Vaccines  Aged Out   HPV VACCINES  Aged Out   Meningococcal B Vaccine  Aged Out    Health Maintenance  Health Maintenance Due  Topic Date Due   Zoster Vaccines- Shingrix  (2 of 2) 10/07/2021   Medicare Annual  Wellness (AWV)  09/23/2022   COVID-19 Vaccine (6 - 2024-25 season) 06/24/2023   Health Maintenance Items Addressed: Discussed the need to get his second shingles vaccine and to update his flu and covid vaccines annually.  Additional Screening:  Vision Screening: Recommended annual ophthalmology exams for early detection of glaucoma and other disorders of the eye.Up to date [facility] Would you like a referral to an eye doctor? No    Dental Screening: Recommended annual dental exams for proper oral hygiene  Community Resource Referral / Chronic Care Management: CRR required this visit?  No   CCM required this visit?  No   Plan:    I have personally reviewed and noted the following in the patient's chart:   Medical and social history Use of alcohol, tobacco or illicit drugs  Current medications and supplements including opioid prescriptions. Patient is not currently taking opioid  prescriptions. Functional ability and status Nutritional status Physical activity Advanced directives List of other physicians Hospitalizations, surgeries, and ER visits in previous 12 months Vitals Screenings to include cognitive, depression, and falls Referrals and appointments  In addition, I have reviewed and discussed with patient certain preventive protocols, quality metrics, and best practice recommendations. A written personalized care plan for preventive services as well as general preventive health recommendations were provided to patient.   Angeline Fredericks, LPN   [id number]/83/[id number]   After Visit Summary: (MyChart) Due to this being a telephonic visit, the after visit summary with patients personalized plan was offered to patient via MyChart   Notes: Nothing significant to report at this time.

## 2024-05-07 NOTE — Patient Instructions (Signed)
 [name] , Thank you for taking time out of your busy schedule to complete your Annual Wellness Visit with me. I enjoyed our conversation and look forward to speaking with you again next year. I, as well as your care team,  appreciate your ongoing commitment to your health goals. Please review the following plan we discussed and let me know if I can assist you in the future. Your Game plan/ To Do List    Referrals: If you haven't heard from the office you've been referred to, please reach out to them at the phone provided.  Remember to get your second shingles vaccine and update your flu and covid vaccines annually Follow up Visits: Next Medicare AWV with our clinical staff: 05/12/25 @ 10:10   Have you seen your provider in the last 6 months (3 months if uncontrolled diabetes)? Yes Next Office Visit with your provider: 08/14/24   Clinician Recommendations:  Aim for 30 minutes of exercise or brisk walking, 6-8 glasses of water, and 5 servings of fruits and vegetables each day.       This is a list of the screening recommended for you and due dates:  Health Maintenance  Topic Date Due   Zoster (Shingles) Vaccine (2 of 2) 10/07/2021   COVID-19 Vaccine (8 - Pfizer risk 2024-25 season) 02/08/2024   Flu Shot  05/23/2024   Medicare Annual Wellness Visit  05/07/2025   Colon Cancer Screening  09/13/2027   DTaP/Tdap/Td vaccine (2 - Td or Tdap) 06/03/2031   Pneumococcal Vaccine for age over 72  Completed   Hepatitis C Screening  Completed   HIV Screening  Completed   Hepatitis B Vaccine  Aged Out   HPV Vaccine  Aged Out   Meningitis B Vaccine  Aged Out    Advanced directives: (ACP Link)Information on Advanced Care Planning can be found at [place]  Secretary of [facility] Advance Health Care Directives Advance Health Care Directives. [email or web address]  Advance Care Planning is important because it:  [x]  Makes sure you receive the medical care that is consistent with your values, goals, and  preferences  [x]  It provides guidance to your family and loved ones and reduces their decisional burden about whether or not they are making the right decisions based on your wishes.  Follow the link provided in your after visit summary or read over the paperwork we have mailed to you to help you started getting your Advance Directives in place. If you need assistance in completing these, please reach out to us  so that we can help you!

## 2024-05-15 ENCOUNTER — Other Ambulatory Visit: Payer: Self-pay

## 2024-05-15 DIAGNOSIS — I10 Essential (primary) hypertension: Secondary | ICD-10-CM

## 2024-05-15 DIAGNOSIS — E785 Hyperlipidemia, unspecified: Secondary | ICD-10-CM

## 2024-05-15 MED ORDER — ATORVASTATIN CALCIUM 20 MG PO TABS: 20.0000 mg | ORAL_TABLET | Freq: Every day | ORAL | 0 refills | Status: AC

## 2024-05-15 MED ORDER — LOSARTAN POTASSIUM-HCTZ 50-12.5 MG PO TABS: 1.0000 | ORAL_TABLET | Freq: Every day | ORAL | 0 refills | Status: AC

## 2024-05-26 ENCOUNTER — Other Ambulatory Visit: Payer: Self-pay

## 2024-05-26 DIAGNOSIS — K219 Gastro-esophageal reflux disease without esophagitis: Secondary | ICD-10-CM

## 2024-05-28 MED ORDER — PANTOPRAZOLE SODIUM 40 MG PO TBEC: 40.0000 mg | DELAYED_RELEASE_TABLET | Freq: Every day | ORAL | 1 refills | Status: AC

## 2024-07-14 ENCOUNTER — Ambulatory Visit: Payer: Self-pay

## 2024-07-14 NOTE — Telephone Encounter (Signed)
 FYI Only or Action Required?: FYI only for provider.  Patient was last seen in primary care on 02/22/2024 by Hope Merle, MD.  Called Nurse Triage reporting Cough.  Symptoms began several days ago.  Interventions attempted: OTC medications: Day/Nyquil.  Symptoms are: unchanged.  Triage Disposition: See Physician Within 24 Hours  Patient/caregiver understands and will follow disposition?: Yes      Copied from CRM #[id number]. Topic: Clinical - Red Word Triage >> Jul 14, 2024  8:41 AM Roselie BROCKS wrote: [facility] that prompted transfer to Nurse Triage: Patient has cough that is getting worse, coughed up discolored phlegm, over the counter medication isn't helping. Reason for Disposition  [1] Continuous (nonstop) coughing interferes with work or school AND [2] no improvement using cough treatment per Care Advice  Answer Assessment - Initial Assessment Questions 1. ONSET: When did the cough begin?      A few days 2. SEVERITY: How bad is the cough today?      Cough is getting heavier 3. SPUTUM: Describe the color of your sputum (e.g., none, dry cough; clear, white, yellow, green)     Productive green mucus 4. HEMOPTYSIS: Are you coughing up any blood? If Yes, ask: How much? (e.g., flecks, streaks, tablespoons, etc.)     denies 5. DIFFICULTY BREATHING: Are you having difficulty breathing? If Yes, ask: How bad is it? (e.g., mild, moderate, severe)      denies 6. FEVER: Do you have a fever? If Yes, ask: What is your temperature, how was it measured, and when did it start?     denies 7. CARDIAC HISTORY: Do you have any history of heart disease? (e.g., heart attack, congestive heart failure)      denies 8. LUNG HISTORY: Do you have any history of lung disease?  (e.g., pulmonary embolus, asthma, emphysema)     Hx of asthma - endorses having albuterol  PRN 9. PE RISK FACTORS: Do you have a history of blood clots? (or: recent major surgery, recent prolonged travel,  bedridden)     denies 10. OTHER SYMPTOMS: Do you have any other symptoms? (e.g., runny nose, wheezing, chest pain)       Endorses runny nose, denies others 11. PREGNANCY: Is there any chance you are pregnant? When was your last menstrual period?       N/a 12. TRAVEL: Have you traveled out of the country in the last month? (e.g., travel history, exposures)       N/a    Triager unable to find access/appropriate appointment via the [facility] Decision Tree. Triager called PCP CAL and spoke to [place]--- she confirmed no access within dispo.  Triager scheduled with [facility].  Protocols used: Cough - Acute Productive-A-AH

## 2024-07-15 ENCOUNTER — Ambulatory Visit
Admission: RE | Admit: 2024-07-15 | Discharge: 2024-07-15 | Disposition: A | Discharge status: Home / Self Care | Payer: Self-pay | Source: Ambulatory Visit | Attending: Emergency Medicine | Admitting: Emergency Medicine

## 2024-07-15 VITALS — BP 115/81 | HR 80 | Temp 98.3°F | Resp 20

## 2024-07-15 DIAGNOSIS — J069 Acute upper respiratory infection, unspecified: Secondary | ICD-10-CM

## 2024-07-15 MED ORDER — BENZONATATE 100 MG PO CAPS: 100.0000 mg | ORAL_CAPSULE | Freq: Three times a day (TID) | ORAL | 0 refills | Status: DC

## 2024-07-15 MED ORDER — PREDNISONE 10 MG (21) PO TBPK: ORAL_TABLET | Freq: Every day | ORAL | 0 refills | Status: DC

## 2024-07-15 MED ORDER — PROMETHAZINE-DM 6.25-15 MG/5ML PO SYRP: 5.0000 mL | ORAL_SOLUTION | Freq: Every evening | ORAL | 0 refills | Status: DC | PRN

## 2024-07-15 MED ORDER — AMOXICILLIN-POT CLAVULANATE 875-125 MG PO TABS
1.0000 | ORAL_TABLET | Freq: Two times a day (BID) | ORAL | 0 refills | Status: DC
Start: 2024-07-15 — End: 2024-08-14

## 2024-07-15 NOTE — ED Triage Notes (Signed)
 Patient to Urgent Care with complaints of productive cough/ wheezing.  Symptoms x1 week. Symptoms worse at night.   Using albuterol  inhaler/ Nyquil and Dayquil/ cough drops.

## 2024-07-15 NOTE — Discharge Instructions (Signed)
 Begin Augmentin  twice daily for 7 days to cover for bacteria possibly causing symptoms to linger  Begin prednisone  every morning with food to open and relax the airway, she has subtle wheezing, avoid ibuprofen  or use but may use Tylenol   You may use Tessalon  pill every 8 hours for coughing and may use cough syrup at bedtime for  For cough: honey 1/2 to 1 teaspoon (you can dilute the honey in water or another fluid).  You can also use guaifenesin and dextromethorphan for cough. You can use a humidifier for chest congestion and cough.  If you don't have a humidifier, you can sit in the bathroom with the hot shower running.      For sore throat: try warm salt water gargles, cepacol lozenges, throat spray, warm tea or water with lemon/honey, popsicles or ice, or OTC cold relief medicine for throat discomfort.   For congestion: take a daily anti-histamine like Zyrtec , Claritin, and a oral decongestant, such as pseudoephedrine.  You can also use Flonase  1-2 sprays in each nostril daily.   It is important to stay hydrated: drink plenty of fluids (water, gatorade/powerade/pedialyte, juices, or teas) to keep your throat moisturized and help further relieve irritation/discomfort.

## 2024-07-15 NOTE — ED Provider Notes (Signed)
 Matthew Turner    CSN: [id number] Arrival date & time: 07/15/24  0844      History   Chief Complaint Chief Complaint  Patient presents with   Cough    Entered by patient    HPI QUARTEZ LAGOS is a 63 y.o. male.   Patient presents for evaluation of nasal congestion, productive cough intermittent wheezing and a mild headache present for 7 days.  Possible sick contacts.  Has attempted use of albuterol  inhaler, NyQuil DayQuil and cough drops.  Tolerable to food and liquids.  Denies fever, shortness of breath.   Past Medical History:  Diagnosis Date   Asthma    dust   Cancer (HCC)    prostate; early 32s    Chronic pain    f/u pain clinic in [place]; arms, neck, back-pain clinic in [place]   COVID-19    02/17/20 hosp 5/3-02/27/20 covid pneumonia, 12/31/21   Glaucoma    HLD (hyperlipidemia)    Hypertension    Spinal cord injury, C5-C7 (HCC)    C6.C7 in 2008 injury at work he was paralyzed from waist down but walking again;workers comp    Patient Active Problem List   Diagnosis Date Noted   Acute left flank pain 02/22/2024   Left lower quadrant pain 02/22/2024   Acute viral bronchitis 12/03/2023   Eczema 06/03/2023   GERD (gastroesophageal reflux disease) 04/14/2023   Allergic rhinitis 04/14/2023   History of colonic polyps 09/12/2022   Polyp of descending colon 09/12/2022   Impotence 10/27/2021   Diabetes insipidus 10/27/2021   Dysuria 10/27/2021   Increased frequency of urination 10/27/2021   Low compliance bladder 10/27/2021   Microscopic hematuria 10/27/2021   Nocturia 10/27/2021   Primary malignant neoplasm of prostate (HCC) 10/27/2021   Raised prostate specific antigen 10/27/2021   PAD (peripheral artery disease) 09/05/2021   Annual physical exam 10/07/2020   Elevated liver enzymes 05/05/2020   Obesity (BMI 30.0-34.9) 05/05/2020   Hip pain, acute, right 10/29/2019   Cataract of both eyes 10/29/2019   Lumbar radiculopathy 10/09/2019   Encounter for screening  colonoscopy    Herniation of nucleus pulposus of lumbar intervertebral disc with sciatica 08/06/2019   History of fusion of cervical spine 08/06/2019   Cervical disc disorder with radiculopathy 08/06/2019   Lumbar post-laminectomy syndrome 08/06/2019   Chronic pain syndrome 08/06/2019   History of prostate cancer 04/01/2019   Essential hypertension    HLD (hyperlipidemia)    Glaucoma of both eyes    Chronic pain    Asthma    Spinal cord injury, C5-C7 (HCC)     Past Surgical History:  Procedure Laterality Date   BACK SURGERY     COLONOSCOPY WITH PROPOFOL  N/A 08/18/2019   Procedure: COLONOSCOPY WITH PROPOFOL ;  Surgeon: Unk Corinn Skiff, MD;  Location: [facility] ENDOSCOPY;  Service: Gastroenterology;  Laterality: N/A;   COLONOSCOPY WITH PROPOFOL  N/A 09/12/2022   Procedure: COLONOSCOPY WITH PROPOFOL ;  Surgeon: Unk Corinn Skiff, MD;  Location: [facility] ENDOSCOPY;  Service: Gastroenterology;  Laterality: N/A;   LUMBAR LAMINECTOMY     PROSTATECTOMY     had radiation no injections h/o prostate pump        Home Medications    Prior to Admission medications   Medication Sig Start Date End Date Taking? Authorizing Provider  amoxicillin -clavulanate (AUGMENTIN ) 875-125 MG tablet Take 1 tablet by mouth every 12 (twelve) hours. 07/15/24  Yes Stacia Feazell R, NP  benzonatate  (TESSALON ) 100 MG capsule Take 1 capsule (100 mg total) by  mouth every 8 (eight) hours. 07/15/24  Yes Renel Ende R, NP  predniSONE  (STERAPRED UNI-PAK 21 TAB) 10 MG (21) TBPK tablet Take by mouth daily. Take 6 tabs by mouth daily  for 1 days, then 5 tabs for 1 days, then 4 tabs for 1 days, then 3 tabs for 1 days, 2 tabs for 1 days, then 1 tab by mouth daily for 1 days 07/15/24  Yes Fabion Gatson R, NP  promethazine -dextromethorphan (PROMETHAZINE -DM) 6.25-15 MG/5ML syrup Take 5 mLs by mouth at bedtime as needed. 07/15/24  Yes Jerald Villalona R, NP  albuterol  (VENTOLIN  HFA) 108 (90 Base) MCG/ACT inhaler INHALE 1-2 PUFFS BY  MOUTH EVERY 6 HOURS AS NEEDED FOR WHEEZE OR SHORTNESS OF BREATH 07/28/22   McLean-Scocuzza, Randine SAILOR, MD  Ascorbic Acid  (VITAMIN C) 1000 MG tablet Take 1,000 mg by mouth daily. 12/23/19   [provider]  atorvastatin  (LIPITOR) 20 MG tablet Take 1 tablet (20 mg total) by mouth daily. 05/15/24   Kaur, Charanpreet, NP  Azelastine -Fluticasone  (DYMISTA ) 137-50 MCG/ACT SUSP Place 1 spray into both nostrils 2 (two) times daily as needed. 04/03/23   Hope Merle, MD  B Complex Vitamins (VITAMIN B-COMPLEX) TABS Take 1 tablet by mouth daily in the afternoon.  12/23/19   [provider]  cetirizine  (ZYRTEC ) 10 MG tablet TAKE 1 TABLET (10 MG TOTAL) BY MOUTH AT BEDTIME AS NEEDED FOR ALLERGIES. 03/31/24   Hope Merle, MD  clotrimazole -betamethasone  (LOTRISONE ) cream Apply 1 Application topically daily. 04/07/22   McLean-Scocuzza, Randine SAILOR, MD  Cyanocobalamin  (B-12) 500 MCG TABS Take 1,000 mcg by mouth daily. 10/07/20   McLean-Scocuzza, Randine SAILOR, MD  gabapentin  (NEURONTIN ) 400 MG capsule Take 400 mg by mouth 2 (two) times daily.    [provider]  hydrocortisone  2.5 % lotion APPLY TOPICALLY 2 TIMES DAILY AS NEEDED. 03/25/24   Hope Merle, MD  losartan -hydrochlorothiazide (HYZAAR) 50-12.5 MG tablet Take 1 tablet by mouth daily. 05/15/24   Kaur, Charanpreet, NP  LUMIGAN  0.01 % SOLN INSTILL 1 DROP INTO BOTH EYES EVERY NIGHT AT BEDTIME 12/15/21   McLean-Scocuzza, Randine SAILOR, MD  pantoprazole  (PROTONIX ) 40 MG tablet Take 1 tablet (40 mg total) by mouth daily. 05/28/24   Tullo, Teresa L, MD  senna (SENOKOT) 8.6 MG TABS tablet Take 1 tablet by mouth daily. Patient not taking: Reported on 05/07/2024    [provider]  sodium chloride  (OCEAN) 0.65 % SOLN nasal spray Place 2 sprays into both nostrils daily as needed for congestion. Patient not taking: Reported on 05/07/2024 07/18/22   McLean-Scocuzza, Randine SAILOR, MD  XTAMPZA  ER 9 MG C12A TAKE ONE CAPSULE BY MOUTH EVERY 12 HOURS 04/01/21   [provider]  Zinc  100 MG TABS Take 1 tablet (100 mg total) by mouth daily. 03/09/20   McLean-Scocuzza, Randine SAILOR, MD    Family History Family History  Problem Relation Age of Onset   Heart failure Mother    Cancer Father        prostate   Dementia Father    Cancer Brother        prostate   Prostate cancer Brother    Pancreatic cancer Paternal Grandmother    Prostate cancer Other    Prostate cancer Brother        prostate   Dementia Sister     Social History Social History   Tobacco Use   Smoking status: Former    Types: Cigarettes   Smokeless tobacco: Never  Vaping Use   Vaping status: Never  Used  Substance Use Topics   Alcohol use: Yes    Comment: Occasionally    Drug use: Never     Allergies   Patient has no known allergies.   Review of Systems Review of Systems  Respiratory:  Positive for cough.      Physical Exam Triage Vital Signs ED Triage Vitals  Encounter Vitals Group     BP 07/15/24 0908 115/81     Girls Systolic BP Percentile --      Girls Diastolic BP Percentile --      Boys Systolic BP Percentile --      Boys Diastolic BP Percentile --      Pulse Rate 07/15/24 0908 80     Resp 07/15/24 0908 20     Temp 07/15/24 0908 98.3 F (36.8 C)     Temp src --      SpO2 07/15/24 0908 98 %     Weight --      Height --      Head Circumference --      Peak Flow --      Pain Score 07/15/24 0905 0     Pain Loc --      Pain Education --      Exclude from Growth Chart --    No data found.  Updated Vital Signs BP 115/81   Pulse 80   Temp 98.3 F (36.8 C)   Resp 20   SpO2 98%   Visual Acuity Right Eye Distance:   Left Eye Distance:   Bilateral Distance:    Right Eye Near:   Left Eye Near:    Bilateral Near:     Physical Exam Constitutional:      Appearance: Normal appearance.  HENT:     Head: Normocephalic.     Right Ear: Tympanic membrane, ear canal and external ear normal.     Left Ear: Tympanic membrane, ear canal and external ear normal.      Nose: Congestion present.     Mouth/Throat:     Mouth: Mucous membranes are moist.     Pharynx: Oropharynx is clear. Posterior oropharyngeal erythema present. No oropharyngeal exudate.  Eyes:     Extraocular Movements: Extraocular movements intact.  Cardiovascular:     Rate and Rhythm: Normal rate and regular rhythm.     Pulses: Normal pulses.     Heart sounds: Normal heart sounds.  Pulmonary:     Effort: Pulmonary effort is normal.     Breath sounds: Normal breath sounds.  Musculoskeletal:     Cervical back: Normal range of motion and neck supple.  Neurological:     Mental Status: He is alert and oriented to person, place, and time. Mental status is at baseline.      UC Treatments / Results  Labs (all labs ordered are listed, but only abnormal results are displayed) Labs Reviewed - No data to display  EKG   Radiology No results found.  Procedures Procedures (including critical care time)  Medications Ordered in UC Medications - No data to display  Initial Impression / Assessment and Plan / UC Course  I have reviewed the triage vital signs and the nursing notes.  Pertinent labs & imaging results that were available during my care of the patient were reviewed by me and considered in my medical decision making (see chart for details).  Acute URI  Patient is in no signs of distress nor toxic appearing.  Vital signs are stable.  Low suspicion  for pneumonia, pneumothorax or bronchitis and therefore will defer imaging.  Viral testing deferred due to timeline of illness.  Prescribed Augmentin  prednisone  Tessalon  and Promethazine  DM.May use additional over-the-counter medications as needed for supportive care.  May follow-up with urgent care as needed if symptoms persist or worsen.   Final Clinical Impressions(s) / UC Diagnoses   Final diagnoses:  Acute URI     Discharge Instructions      Begin Augmentin  twice daily for 7 days to cover for bacteria possibly  causing symptoms to linger  Begin prednisone  every morning with food to open and relax the airway, she has subtle wheezing, avoid ibuprofen  or use but may use Tylenol   You may use Tessalon  pill every 8 hours for coughing and may use cough syrup at bedtime for  For cough: honey 1/2 to 1 teaspoon (you can dilute the honey in water or another fluid).  You can also use guaifenesin and dextromethorphan for cough. You can use a humidifier for chest congestion and cough.  If you don't have a humidifier, you can sit in the bathroom with the hot shower running.      For sore throat: try warm salt water gargles, cepacol lozenges, throat spray, warm tea or water with lemon/honey, popsicles or ice, or OTC cold relief medicine for throat discomfort.   For congestion: take a daily anti-histamine like Zyrtec , Claritin, and a oral decongestant, such as pseudoephedrine.  You can also use Flonase  1-2 sprays in each nostril daily.   It is important to stay hydrated: drink plenty of fluids (water, gatorade/powerade/pedialyte, juices, or teas) to keep your throat moisturized and help further relieve irritation/discomfort.    ED Prescriptions     Medication Sig Dispense Auth. Provider   predniSONE  (STERAPRED UNI-PAK 21 TAB) 10 MG (21) TBPK tablet Take by mouth daily. Take 6 tabs by mouth daily  for 1 days, then 5 tabs for 1 days, then 4 tabs for 1 days, then 3 tabs for 1 days, 2 tabs for 1 days, then 1 tab by mouth daily for 1 days 21 tablet Wilborn Membreno R, NP   amoxicillin -clavulanate (AUGMENTIN ) 875-125 MG tablet Take 1 tablet by mouth every 12 (twelve) hours. 14 tablet Henleigh Robello R, NP   benzonatate  (TESSALON ) 100 MG capsule Take 1 capsule (100 mg total) by mouth every 8 (eight) hours. 21 capsule Torrell Krutz R, NP   promethazine -dextromethorphan (PROMETHAZINE -DM) 6.25-15 MG/5ML syrup Take 5 mLs by mouth at bedtime as needed. 118 mL Aundra Pung, Shelba SAUNDERS, NP      PDMP not reviewed this encounter.    Teresa Shelba SAUNDERS, [place] 07/15/24 [phone]

## 2024-08-04 ENCOUNTER — Ambulatory Visit: Payer: Self-pay

## 2024-08-04 NOTE — Telephone Encounter (Signed)
 FYI Only or Action Required?: FYI only for provider.  Patient was last seen in primary care on 02/22/2024 by Hope Merle, MD.  Called Nurse Triage reporting Urinary Frequency.  Symptoms began several weeks ago.  Interventions attempted: Nothing.  Symptoms are: stable.  Triage Disposition: See PCP Within 2 Weeks  Patient/caregiver understands and will follow disposition?: Yes   Copied from CRM [id number]. Topic: Clinical - Red Word Triage >> Aug 04, 2024 11:32 AM Robinson H wrote: [facility] that prompted transfer to Nurse Triage: Frequent urination, consistent headache for 2 days in front forehead area, over eyes Reason for Disposition  All other urine symptoms  Answer Assessment - Initial Assessment Questions Patient reports increased urinary frequency but while on the phone realized he is taking losartan -hydrochlorothiazide and feels he will wait until his new appt to discuss with provider.   1. SYMPTOM: What's the main symptom you're concerned about? (e.g., frequency, incontinence)     Increased frequency with urination 2. ONSET: When did the  increased urination  start?     Patient states over the last few months but states he is on that medication  3. PAIN: Is there any pain? If Yes, ask: How bad is it? (Scale: 1-10; mild, moderate, severe)     No 4. CAUSE: What do you think is causing the symptoms?     Patient is stating he wonders if his sugar levels are up high, he was told in the past he was borderline. Not taking any diabetic medication or diagnosed as diabetic.  Patient is also on losartan -hydrochlorothiazide.  5. OTHER SYMPTOMS: Do you have any other symptoms? (e.g., blood in urine, fever, flank pain, pain with urination)     No  Protocols used: Urinary Symptoms-A-AH

## 2024-08-14 ENCOUNTER — Encounter: Payer: Self-pay | Admitting: Nurse Practitioner

## 2024-08-14 ENCOUNTER — Ambulatory Visit: Admitting: Nurse Practitioner

## 2024-08-14 VITALS — BP 124/80 | HR 85 | Temp 97.9°F | Ht 70.0 in | Wt 229.6 lb

## 2024-08-14 DIAGNOSIS — Z131 Encounter for screening for diabetes mellitus: Secondary | ICD-10-CM | POA: Diagnosis not present

## 2024-08-14 DIAGNOSIS — M7989 Other specified soft tissue disorders: Secondary | ICD-10-CM

## 2024-08-14 DIAGNOSIS — H409 Unspecified glaucoma: Secondary | ICD-10-CM

## 2024-08-14 DIAGNOSIS — J4520 Mild intermittent asthma, uncomplicated: Secondary | ICD-10-CM | POA: Diagnosis not present

## 2024-08-14 DIAGNOSIS — G4733 Obstructive sleep apnea (adult) (pediatric): Secondary | ICD-10-CM

## 2024-08-14 DIAGNOSIS — C61 Malignant neoplasm of prostate: Secondary | ICD-10-CM

## 2024-08-14 DIAGNOSIS — I10 Essential (primary) hypertension: Secondary | ICD-10-CM

## 2024-08-14 DIAGNOSIS — K219 Gastro-esophageal reflux disease without esophagitis: Secondary | ICD-10-CM

## 2024-08-14 DIAGNOSIS — S14105S Unspecified injury at C5 level of cervical spinal cord, sequela: Secondary | ICD-10-CM

## 2024-08-14 DIAGNOSIS — K635 Polyp of colon: Secondary | ICD-10-CM

## 2024-08-14 DIAGNOSIS — E785 Hyperlipidemia, unspecified: Secondary | ICD-10-CM

## 2024-08-14 DIAGNOSIS — J309 Allergic rhinitis, unspecified: Secondary | ICD-10-CM

## 2024-08-14 LAB — CBC WITH DIFFERENTIAL/PLATELET
Basophils Absolute: 0 K/uL (ref 0.0–0.1)
Basophils Relative: 0.6 % (ref 0.0–3.0)
Eosinophils Absolute: 0.1 K/uL (ref 0.0–0.7)
Eosinophils Relative: 2.1 % (ref 0.0–5.0)
HCT: 40.3 % (ref 39.0–52.0)
Hemoglobin: 13.3 g/dL (ref 13.0–17.0)
Lymphocytes Relative: 30.5 % (ref 12.0–46.0)
Lymphs Abs: 1.4 K/uL (ref 0.7–4.0)
MCHC: 33 g/dL (ref 30.0–36.0)
MCV: 90.1 fl (ref 78.0–100.0)
Monocytes Absolute: 0.3 K/uL (ref 0.1–1.0)
Monocytes Relative: 6 % (ref 3.0–12.0)
Neutro Abs: 2.7 K/uL (ref 1.4–7.7)
Neutrophils Relative %: 60.8 % (ref 43.0–77.0)
Platelets: 340 K/uL (ref 150.0–400.0)
RBC: 4.47 Mil/uL (ref 4.22–5.81)
RDW: 13.6 % (ref 11.5–15.5)
WBC: 4.5 K/uL (ref 4.0–10.5)

## 2024-08-14 LAB — COMPREHENSIVE METABOLIC PANEL WITH GFR
ALT: 25 U/L (ref 0–53)
AST: 17 U/L (ref 0–37)
Albumin: 4.4 g/dL (ref 3.5–5.2)
Alkaline Phosphatase: 90 U/L (ref 39–117)
BUN: 17 mg/dL (ref 6–23)
CO2: 31 meq/L (ref 19–32)
Calcium: 9.3 mg/dL (ref 8.4–10.5)
Chloride: 101 meq/L (ref 96–112)
Creatinine, Ser: 1.01 mg/dL (ref 0.40–1.50)
GFR: 78.17 mL/min (ref >60.00)
Glucose, Bld: 113 mg/dL — ABNORMAL HIGH (ref 70–99)
Potassium: 4.1 meq/L (ref 3.5–5.1)
Sodium: 141 meq/L (ref 135–145)
Total Bilirubin: 0.9 mg/dL (ref 0.2–1.2)
Total Protein: 7 g/dL (ref 6.0–8.3)

## 2024-08-14 LAB — LIPID PANEL
Cholesterol: 141 mg/dL (ref 0–200)
HDL: 46 mg/dL (ref >39.00)
LDL Cholesterol: 81 mg/dL (ref 0–99)
NonHDL: 94.86
Total CHOL/HDL Ratio: 3
Triglycerides: 68 mg/dL (ref 0.0–149.0)
VLDL: 13.6 mg/dL (ref 0.0–40.0)

## 2024-08-14 LAB — PSA: PSA: 0 ng/mL — ABNORMAL LOW (ref 0.10–4.00)

## 2024-08-14 LAB — HEMOGLOBIN A1C: Hgb A1c MFr Bld: 6.2 % (ref 4.6–6.5)

## 2024-08-14 LAB — URIC ACID: Uric Acid, Serum: 7.5 mg/dL (ref 4.0–7.8)

## 2024-08-14 LAB — TSH: TSH: 1.42 u[IU]/mL (ref 0.35–5.50)

## 2024-08-14 NOTE — Assessment & Plan Note (Addendum)
 Asthma exacerbated by dust exposure, likely related to allergic rhinitis. - Continue cetirizine , Flonase , and albuterol  as needed.

## 2024-08-14 NOTE — Assessment & Plan Note (Signed)
 Chronic stable. - Continue cetrizine and Flonase  nasal spray daily.

## 2024-08-14 NOTE — Progress Notes (Signed)
 Established Patient Office Visit  Subjective:  Patient ID: Matthew Turner, male    DOB: 1961-07-08  Age: 63 y.o. MRN: [id number]  CC:  Chief Complaint  Patient presents with   Establish Care    Transfer of Care   Discussed the use of a AI scribe software for clinical note transcription with the patient, who gave verbal consent to proceed.  HPI  Matthew Turner is a 63 year old male presents for transfer of care. His previous PCP was Dr. Hope.  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 He underwent fusion surgery and has significant nerve damage. Gabapentin  helps stabilize him and reduce staggering when walking. Oxycodone  is used for pain management. Followed by pain management Dr. Fernand.  Hyperlipidemia: He is on atorvastatin  20 mg.  He is compliant with the medication.  Hypertension: Well-managed with losartan /HCTZ 50-12.5 mg daily.  Does not check blood pressure regularly at home.  He has asthma that flares up around dust, attributed to allergies. He uses a CPAP for sleep apnea and takes cetirizine  and Flonase  daily for allergies. Albuterol  is used as needed for asthma symptoms.  He has swelling in his  dorsal feet, particularly the right foot, described as 'bony swollen'. He experiences occasional pain and numbness in his toes, especially when pressure is applied. He has been previously told this could be gout but is not on any medication for it.   He was diagnosed with prostate cancer in 2013, with prostate removal in 2014, and has an implant following the surgery.   HPI   Past Medical History:  Diagnosis Date   Asthma    dust   Cancer (HCC)    prostate; early 36s    Chronic pain    f/u pain clinic in [place]; arms, neck, back-pain clinic in [place]   COVID-19    02/17/20 hosp 5/3-02/27/20 covid pneumonia, 12/31/21   Glaucoma    HLD (hyperlipidemia)    Hypertension    Spinal  cord injury, C5-C7 (HCC)    C6.C7 in 2008 injury at work he was paralyzed from waist down but walking again;workers comp    Past Surgical History:  Procedure Laterality Date   BACK SURGERY     COLONOSCOPY WITH PROPOFOL  N/A 08/18/2019   Procedure: COLONOSCOPY WITH PROPOFOL ;  Surgeon: Unk Corinn Skiff, MD;  Location: [facility] ENDOSCOPY;  Service: Gastroenterology;  Laterality: N/A;   COLONOSCOPY WITH PROPOFOL  N/A 09/12/2022   Procedure: COLONOSCOPY WITH PROPOFOL ;  Surgeon: Unk Corinn Skiff, MD;  Location: [facility] ENDOSCOPY;  Service: Gastroenterology;  Laterality: N/A;   LUMBAR LAMINECTOMY     PROSTATECTOMY     had radiation no injections h/o prostate pump     Family History  Problem Relation Age of Onset   Heart failure Mother    Cancer Father        prostate   Dementia Father    Cancer Brother        prostate   Prostate cancer Brother    Pancreatic cancer Paternal Grandmother    Prostate cancer Other    Prostate cancer Brother        prostate   Dementia Sister     Social History   Socioeconomic History   Marital status: Married    Spouse name: Not on file   Number of children: 3   Years of education: Not on file   Highest education  level: 9th grade  Occupational History   Not on file  Tobacco Use   Smoking status: Former    Types: Cigarettes   Smokeless tobacco: Never  Vaping Use   Vaping status: Never Used  Substance and Sexual Activity   Alcohol use: Yes    Comment: Occasionally    Drug use: Never   Sexual activity: Yes  Other Topics Concern   Not on file  Social History Narrative   Former smoker quit 38    Occasional etoh    Married with 2 sons and 1 daughter    From [place]   Retired    Social Drivers of Corporate Investment Banker Strain: Low Risk  (08/10/2024)   Overall Financial Resource Strain (CARDIA)    Difficulty of Paying Living Expenses: Not hard at all  Food Insecurity: No Food Insecurity (08/10/2024)   Hunger Vital Sign    Worried About  Running Out of Food in the Last Year: Never true    Ran Out of Food in the Last Year: Never true  Transportation Needs: No Transportation Needs (08/10/2024)   PRAPARE - Administrator, Civil Service (Medical): No    Lack of Transportation (Non-Medical): No  Physical Activity: Inactive (05/07/2024)   Exercise Vital Sign    Days of Exercise per Week: 0 days    Minutes of Exercise per Session: 0 min  Stress: No Stress Concern Present (08/10/2024)   [employer] of Occupational Health - Occupational Stress Questionnaire    Feeling of Stress: Not at all  Social Connections: Moderately Integrated (08/10/2024)   Social Connection and Isolation Panel    Frequency of Communication with Friends and Family: Not on file    Frequency of Social Gatherings with Friends and Family: Three times a week    Attends Religious Services: More than 4 times per year    Active Member of Clubs or Organizations: No    Attends Banker Meetings: Not on file    Marital Status: Married  Intimate Partner Violence: Not At Risk (05/07/2024)   Humiliation, Afraid, Rape, and Kick questionnaire    Fear of Current or Ex-Partner: No    Emotionally Abused: No    Physically Abused: No    Sexually Abused: No     Outpatient Medications Prior to Visit  Medication Sig Dispense Refill   albuterol  (VENTOLIN  HFA) 108 (90 Base) MCG/ACT inhaler INHALE 1-2 PUFFS BY MOUTH EVERY 6 HOURS AS NEEDED FOR WHEEZE OR SHORTNESS OF BREATH 18 each 11   Ascorbic Acid  (VITAMIN C) 1000 MG tablet Take 1,000 mg by mouth daily.     atorvastatin  (LIPITOR) 20 MG tablet Take 1 tablet (20 mg total) by mouth daily. 90 tablet 0   Azelastine -Fluticasone  (DYMISTA ) 137-50 MCG/ACT SUSP Place 1 spray into both nostrils 2 (two) times daily as needed. 23 g 11   cetirizine  (ZYRTEC ) 10 MG tablet TAKE 1 TABLET (10 MG TOTAL) BY MOUTH AT BEDTIME AS NEEDED FOR ALLERGIES. 90 tablet 3   clotrimazole -betamethasone  (LOTRISONE ) cream Apply 1  Application topically daily. 30 g 0   Cyanocobalamin  (B-12) 500 MCG TABS Take 1,000 mcg by mouth daily. 90 tablet 3   gabapentin  (NEURONTIN ) 400 MG capsule Take 400 mg by mouth 2 (two) times daily.     hydrocortisone  2.5 % lotion APPLY TOPICALLY 2 TIMES DAILY AS NEEDED. 118 mL 0   losartan -hydrochlorothiazide (HYZAAR) 50-12.5 MG tablet Take 1 tablet by mouth daily. 90 tablet 0   LUMIGAN  0.01 %  SOLN INSTILL 1 DROP INTO BOTH EYES EVERY NIGHT AT BEDTIME 7.5 mL 1   pantoprazole  (PROTONIX ) 40 MG tablet Take 1 tablet (40 mg total) by mouth daily. 90 tablet 1   senna (SENOKOT) 8.6 MG TABS tablet Take 1 tablet by mouth daily.     sodium chloride  (OCEAN) 0.65 % SOLN nasal spray Place 2 sprays into both nostrils daily as needed for congestion. 30 mL 0   XTAMPZA  ER 9 MG C12A TAKE ONE CAPSULE BY MOUTH EVERY 12 HOURS     Zinc  100 MG TABS Take 1 tablet (100 mg total) by mouth daily. 90 tablet 3   amoxicillin -clavulanate (AUGMENTIN ) 875-125 MG tablet Take 1 tablet by mouth every 12 (twelve) hours. 14 tablet 0   B Complex Vitamins (VITAMIN B-COMPLEX) TABS Take 1 tablet by mouth daily in the afternoon.      benzonatate  (TESSALON ) 100 MG capsule Take 1 capsule (100 mg total) by mouth every 8 (eight) hours. 21 capsule 0   predniSONE  (STERAPRED UNI-PAK 21 TAB) 10 MG (21) TBPK tablet Take by mouth daily. Take 6 tabs by mouth daily  for 1 days, then 5 tabs for 1 days, then 4 tabs for 1 days, then 3 tabs for 1 days, 2 tabs for 1 days, then 1 tab by mouth daily for 1 days 21 tablet 0   promethazine -dextromethorphan (PROMETHAZINE -DM) 6.25-15 MG/5ML syrup Take 5 mLs by mouth at bedtime as needed. 118 mL 0   No facility-administered medications prior to visit.    No Known Allergies  ROS Review of Systems Negative unless indicated in HPI.    Objective:    Physical Exam Constitutional:      Appearance: Normal appearance.  HENT:     Mouth/Throat:     Mouth: Mucous membranes are moist.  Eyes:      Conjunctiva/sclera: Conjunctivae normal.     Pupils: Pupils are equal, round, and reactive to light.  Cardiovascular:     Rate and Rhythm: Normal rate and regular rhythm.     Pulses: Normal pulses.     Heart sounds: Normal heart sounds.  Pulmonary:     Effort: Pulmonary effort is normal.     Breath sounds: Normal breath sounds.  Abdominal:     General: Bowel sounds are normal.     Palpations: Abdomen is soft.  Musculoskeletal:     Cervical back: Normal range of motion. No tenderness.     Comments: Swollen bony prominence on the dorsum of the right foot no erythema or warmth  Skin:    General: Skin is warm.     Findings: No bruising.  Neurological:     General: No focal deficit present.     Mental Status: He is alert and oriented to person, place, and time. Mental status is at baseline.  Psychiatric:        Mood and Affect: Mood normal.        Behavior: Behavior normal.        Thought Content: Thought content normal.        Judgment: Judgment normal.     BP 124/80   Pulse 85   Temp 97.9 F (36.6 C)   Ht 5' 10 (1.778 m)   Wt 229 lb 9.6 oz (104.1 kg)   SpO2 99%   BMI 32.94 kg/m  Wt Readings from Last 3 Encounters:  08/14/24 229 lb 9.6 oz (104.1 kg)  05/07/24 210 lb (95.3 kg)  02/22/24 230 lb 12.8 oz (104.7 kg)  Health Maintenance  Topic Date Due   Zoster Vaccines- Shingrix  (2 of 2) 10/07/2021   COVID-19 Vaccine (10 - Pfizer risk 2025-26 season) 02/03/2025   Medicare Annual Wellness (AWV)  05/07/2025   Colonoscopy  09/13/2027   DTaP/Tdap/Td (3 - Td or Tdap) 06/26/2034   Pneumococcal Vaccine: 50+ Years  Completed   Influenza Vaccine  Completed   Hepatitis C Screening  Completed   HIV Screening  Completed   Hepatitis B Vaccines 19-59 Average Risk  Aged Out   Meningococcal B Vaccine  Aged Out    There are no preventive care reminders to display for this patient.  Lab Results  Component Value Date   TSH 1.42 08/14/2024   Lab Results  Component Value Date    WBC 4.5 08/14/2024   HGB 13.3 08/14/2024   HCT 40.3 08/14/2024   MCV 90.1 08/14/2024   PLT 340.0 08/14/2024   Lab Results  Component Value Date   NA 141 08/14/2024   K 4.1 08/14/2024   CO2 31 08/14/2024   GLUCOSE 113 (H) 08/14/2024   BUN 17 08/14/2024   CREATININE 1.01 08/14/2024   BILITOT 0.9 08/14/2024   ALKPHOS 90 08/14/2024   AST 17 08/14/2024   ALT 25 08/14/2024   PROT 7.0 08/14/2024   ALBUMIN 4.4 08/14/2024   CALCIUM  9.3 08/14/2024   ANIONGAP 6 02/27/2020   GFR 78.17 08/14/2024   Lab Results  Component Value Date   CHOL 141 08/14/2024   Lab Results  Component Value Date   HDL 46.00 08/14/2024   Lab Results  Component Value Date   LDLCALC 81 08/14/2024   Lab Results  Component Value Date   TRIG 68.0 08/14/2024   Lab Results  Component Value Date   CHOLHDL 3 08/14/2024   Lab Results  Component Value Date   HGBA1C 6.2 08/14/2024      Assessment & Plan:  Intermittent asthma without complication, unspecified asthma severity Assessment & Plan: Asthma exacerbated by dust exposure, likely related to allergic rhinitis. - Continue cetirizine , Flonase , and albuterol  as needed.    Hyperlipidemia, unspecified hyperlipidemia type Assessment & Plan: Chronic, tolerating statin therapy.  Lab Results  Component Value Date   CHOL 174 04/03/2023   HDL 56.80 04/03/2023   LDLCALC 100 (H) 04/03/2023   TRIG 87.0 04/03/2023   CHOLHDL 3 04/03/2023  - Continue Lipitor 20 mg daily       Orders: -     Lipid panel  C5-C7 level spinal cord injury, sequela Assessment & Plan: Spinal cord injury at C5-C7 with significant nerve damage causing chronic pain and neuropathy. Managed with gabapentin  and oxycodone . - Followed by pain management Dr. Toniann Bathe.   Primary malignant neoplasm of prostate [facility]) Assessment & Plan: History of prostate CA status post prostatectomy.  Family history of prostate cancer.  PSA has been undetectable - Will check  PSA   Orders: -     PSA  Essential hypertension Assessment & Plan: Chronic.   - Continue Losartan  50-12.5 mg daily - Will check labs as outlined.   Orders: -     CBC with Differential/Platelet -     Comprehensive metabolic panel with GFR -     TSH  Gastroesophageal reflux disease, unspecified whether esophagitis present Assessment & Plan: Chronic.  - Continue Protonix  40 mg daily     Screening for diabetes mellitus -     Hemoglobin A1c  Swelling of right foot Assessment & Plan: Intermittent swelling and pain in the right foot, previously  diagnosed as gout. Swelling described as bony. - Order uric acid level to assess for hyperuricemia.  Orders: -     Uric acid  OSA on CPAP Assessment & Plan: Stable on CPAP   Polyp of descending colon, unspecified type  Recommended to take shingles vaccine from the pharmacy.  Follow-up: Return in about 4 months (around 12/15/2024) for chronic management.   Nels Munn, NP

## 2024-08-14 NOTE — Assessment & Plan Note (Signed)
 Chronic, tolerating statin therapy.  Lab Results  Component Value Date   CHOL 174 04/03/2023   HDL 56.80 04/03/2023   LDLCALC 100 (H) 04/03/2023   TRIG 87.0 04/03/2023   CHOLHDL 3 04/03/2023  - Continue Lipitor 20 mg daily

## 2024-08-14 NOTE — Assessment & Plan Note (Addendum)
 Spinal cord injury at C5-C7 with significant nerve damage causing chronic pain and neuropathy. Managed with gabapentin  and oxycodone . - Followed by pain management Dr. Toniann Bathe.

## 2024-08-19 ENCOUNTER — Ambulatory Visit: Payer: Self-pay | Admitting: Nurse Practitioner

## 2024-08-19 NOTE — Progress Notes (Signed)
 The blood work shows no sign of anemia,  Liver, kidney, thyroid , uric acid and cholesterol are normal.

## 2024-08-21 DIAGNOSIS — C19 Malignant neoplasm of rectosigmoid junction: Secondary | ICD-10-CM | POA: Insufficient documentation

## 2024-08-21 DIAGNOSIS — G4733 Obstructive sleep apnea (adult) (pediatric): Secondary | ICD-10-CM | POA: Insufficient documentation

## 2024-08-21 DIAGNOSIS — M7989 Other specified soft tissue disorders: Secondary | ICD-10-CM | POA: Insufficient documentation

## 2024-08-21 NOTE — Assessment & Plan Note (Signed)
 History of prostate CA status post prostatectomy.  Family history of prostate cancer.  PSA has been undetectable - Will check PSA

## 2024-08-21 NOTE — Assessment & Plan Note (Signed)
 Chronic  Continue Protonix  40 mg daily

## 2024-08-21 NOTE — Assessment & Plan Note (Signed)
 Intermittent swelling and pain in the right foot, previously diagnosed as gout. Swelling described as bony. - Order uric acid level to assess for hyperuricemia.

## 2024-08-21 NOTE — Assessment & Plan Note (Signed)
 Chronic.   - Continue Losartan  50-12.5 mg daily - Will check labs as outlined.

## 2024-08-21 NOTE — Assessment & Plan Note (Signed)
 Family h/o

## 2024-08-21 NOTE — Assessment & Plan Note (Signed)
 Stable on CPAP

## 2024-12-19 ENCOUNTER — Ambulatory Visit: Admitting: Nurse Practitioner

## 2025-05-12 ENCOUNTER — Ambulatory Visit
